# Patient Record
Sex: Female | Born: 1980 | Race: Black or African American | Hispanic: No | Marital: Married | Smoking: Never smoker
Health system: Southern US, Community
[De-identification: ages and names within clinical notes are randomized; demographics above are authoritative.]

## PROBLEM LIST (undated history)

## (undated) ENCOUNTER — Inpatient Hospital Stay (HOSPITAL_COMMUNITY): Payer: Self-pay

## (undated) DIAGNOSIS — T7840XA Allergy, unspecified, initial encounter: Secondary | ICD-10-CM

## (undated) DIAGNOSIS — D573 Sickle-cell trait: Secondary | ICD-10-CM

## (undated) DIAGNOSIS — N6452 Nipple discharge: Secondary | ICD-10-CM

## (undated) HISTORY — DX: Allergy, unspecified, initial encounter: T78.40XA

## (undated) HISTORY — DX: Nipple discharge: N64.52

## (undated) HISTORY — PX: NO PAST SURGERIES: SHX2092

## (undated) HISTORY — DX: Sickle-cell trait: D57.3

---

## 2012-12-12 DIAGNOSIS — N6452 Nipple discharge: Secondary | ICD-10-CM

## 2012-12-12 HISTORY — DX: Nipple discharge: N64.52

## 2013-05-24 ENCOUNTER — Ambulatory Visit: Payer: Self-pay | Admitting: Obstetrics and Gynecology

## 2013-06-18 ENCOUNTER — Encounter: Payer: Self-pay | Admitting: General Surgery

## 2013-06-18 ENCOUNTER — Ambulatory Visit (INDEPENDENT_AMBULATORY_CARE_PROVIDER_SITE_OTHER): Payer: BC Managed Care – PPO | Admitting: General Surgery

## 2013-06-18 VITALS — BP 106/62 | HR 62 | Resp 12 | Ht 64.0 in | Wt 132.0 lb

## 2013-06-18 DIAGNOSIS — N6459 Other signs and symptoms in breast: Secondary | ICD-10-CM

## 2013-06-18 DIAGNOSIS — N6452 Nipple discharge: Secondary | ICD-10-CM | POA: Insufficient documentation

## 2013-06-18 NOTE — Progress Notes (Signed)
Patient ID: Danielle Esparza, female   DOB: [birth year], 32 y.o.   MRN: [id number]  Chief Complaint  Patient presents with  . Follow-up    Ultrasound    HPI Danielle Esparza is a 32 y.o. female  Here for follow up ultrasound of the breast done at [facility]. States that on Memorial day her right breast swelled and lasted about 2 days with nipple discharge that was yellow. No nipple drainage since then. No prior breast issues. The patient denies any nipple oral contact. No previous episodes.  Patient does preform routine self breast exams. Patient has a family history of breast and ovarian cancer.  Minimal pain in right breast since yesterday.  The patient is a native of [place]. 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 HPI  Past Medical History  Diagnosis Date  . Allergy     seasonal    History reviewed. No pertinent past surgical history.  Family History  Problem Relation Age of Onset  . Prostate cancer Father   . Sarcoidosis Father   . Breast cancer Paternal Grandmother   . Breast cancer Maternal Aunt 10    ovarian cancer at age 42    Social History History  Substance Use Topics  . Smoking status: Never Smoker   . Smokeless tobacco: Never Used  . Alcohol Use: Yes    No Known Allergies  Current Outpatient Prescriptions  Medication Sig Dispense Refill  . fluticasone (FLONASE) 50 MCG/ACT nasal spray Place 2 sprays into the nose daily.      Marland Kitchen levonorgestrel-ethinyl estradiol (AVIANE,ALESSE,LESSINA) 0.1-20 MG-MCG tablet Take 1 tablet by mouth daily.       No current facility-administered medications for this visit.    Review of Systems Review of Systems  Constitutional: Negative.   Respiratory: Negative.   Cardiovascular: Negative.     Blood pressure 106/62, pulse 62, resp. rate 12, height 5\' 4"  (1.626 m), weight 132 lb (59.875 kg), last menstrual period 06/15/2013.  Physical Exam Physical Exam  Constitutional: She is oriented to person, place, and time. She appears  well-developed and well-nourished.  Cardiovascular: Normal rate and regular rhythm.   Pulmonary/Chest: Effort normal and breath sounds normal. Right breast exhibits no inverted nipple (right nipple swollen), no mass, no nipple discharge, no skin change and no tenderness. Left breast exhibits no inverted nipple, no mass, no nipple discharge, no skin change and no tenderness.  Lymphadenopathy:    She has no cervical adenopathy.    She has no axillary adenopathy.  Neurological: She is alert and oriented to person, place, and time.  Skin: Skin is warm and dry.  Right breast > left breast, less than 1/2 cup size. Thickening in right breast nipple, no drainage appreciated. Mild prominence of the retroareolar ductal tissue is appreciated when massaging the area between thumb and index finger.   Data Reviewed Ultrasound examination of the right breast dated May 24, 2013 showed a 3 x 3 x 6 mm hypoechoic focus with scant vascular flow in the retroareolar area. BI-RAD-4.    Assessment    Likely inflammatory process resulting in nipple drainage and mild ductal prominence.     Plan    Options for management were viewed: 1) early biopsy versus 2) close clinical and ultrasound followup.   I have a low index of suspicion for malignancy. Without recurrent drainage or inflammation, a 3 month followup is planned.       Earline Mayotte 06/18/2013, 9:10 PM

## 2013-06-18 NOTE — Patient Instructions (Addendum)
Continue self breast exams. Call office for any new breast issues or concerns. Call if symptoms come back before 3 month visit.  Follow up in 3 months with office ultrasound

## 2013-09-18 ENCOUNTER — Ambulatory Visit: Payer: BC Managed Care – PPO | Admitting: General Surgery

## 2013-10-08 ENCOUNTER — Ambulatory Visit (INDEPENDENT_AMBULATORY_CARE_PROVIDER_SITE_OTHER): Payer: BC Managed Care – PPO | Admitting: General Surgery

## 2013-10-08 ENCOUNTER — Encounter: Payer: Self-pay | Admitting: General Surgery

## 2013-10-08 ENCOUNTER — Other Ambulatory Visit: Payer: BC Managed Care – PPO

## 2013-10-08 VITALS — BP 112/60 | HR 76 | Resp 12 | Ht 64.75 in | Wt 133.0 lb

## 2013-10-08 DIAGNOSIS — N63 Unspecified lump in unspecified breast: Secondary | ICD-10-CM

## 2013-10-08 NOTE — Patient Instructions (Addendum)
Patient to return as needed. Patient to contact our office with any new questions or concerns.  

## 2013-10-08 NOTE — Progress Notes (Signed)
Patient ID: Danielle Esparza, female   DOB: 1981-10-20, 32 y.o.   MRN: [id number]  Chief Complaint  Patient presents with  . Follow-up    3 month follow up breast ultrasound for nipple discharge    HPI Danielle Esparza is a 31 y.o. female who presents for a 3 month follow up breast ultrasound for nipple discharge. The patient states nipple discharge has resolved. She denies any new problems with the breasts at this time.  No further breast discomfort reported. The patient continues to work at [employer].  HPI  Past Medical History  Diagnosis Date  . Allergy     seasonal  . Nipple discharge 2014    History reviewed. No pertinent past surgical history.  Family History  Problem Relation Age of Onset  . Prostate cancer Father   . Sarcoidosis Father   . Breast cancer Paternal Grandmother   . Breast cancer Maternal Aunt 84    ovarian cancer at age 63    Social History History  Substance Use Topics  . Smoking status: Never Smoker   . Smokeless tobacco: Never Used  . Alcohol Use: Yes    No Known Allergies  Current Outpatient Prescriptions  Medication Sig Dispense Refill  . fluticasone (FLONASE) 50 MCG/ACT nasal spray Place 2 sprays into the nose daily.      Marland Kitchen levonorgestrel-ethinyl estradiol (AVIANE,ALESSE,LESSINA) 0.1-20 MG-MCG tablet Take 1 tablet by mouth daily.       No current facility-administered medications for this visit.    Review of Systems Review of Systems  Constitutional: Negative.   Respiratory: Negative.   Cardiovascular: Negative.     Blood pressure 112/60, pulse 76, resp. rate 12, height 5' 4.75" (1.645 m), weight 133 lb (60.328 kg), last menstrual period 09/29/2013.  Physical Exam Physical Exam  Constitutional: She is oriented to person, place, and time. She appears well-developed and well-nourished.  Neck: No thyromegaly present.  Cardiovascular: Normal rate, regular rhythm and normal heart sounds.   No murmur heard. Pulmonary/Chest: Effort  normal and breath sounds normal. Right breast exhibits no inverted nipple, no mass, no nipple discharge, no skin change and no tenderness. Left breast exhibits no inverted nipple, no mass, no nipple discharge, no skin change and no tenderness.  Lymphadenopathy:    She has no cervical adenopathy.    She has no axillary adenopathy.  Neurological: She is alert and oriented to person, place, and time.  Skin: Skin is warm and dry.    Data Reviewed  Ultrasound examination of the retroareolar area of the right breast showed the previously appreciated nodule has decreased significantly in size. Grossly this measure 0.22 x 0.23 by 0.33 cm. Previously this had measured 0.33 x 0.64 x 0.65 cm. This lesion shows an 80+ percent reduction in size.    Assessment    Benign breast exam. Possible papilloma, resolving.     Plan    The patient was encouraged to call should she have any recurrent breast discomfort or drainage. Follow up otherwise will be on an as-needed basis.        Earline Mayotte 10/08/2013, 8:34 PM

## 2013-10-14 ENCOUNTER — Ambulatory Visit (INDEPENDENT_AMBULATORY_CARE_PROVIDER_SITE_OTHER): Payer: BC Managed Care – PPO | Admitting: Family Medicine

## 2013-10-14 ENCOUNTER — Encounter: Payer: Self-pay | Admitting: Family Medicine

## 2013-10-14 VITALS — BP 119/71 | HR 74 | Ht 64.75 in | Wt 128.8 lb

## 2013-10-14 DIAGNOSIS — N870 Mild cervical dysplasia: Secondary | ICD-10-CM

## 2013-10-14 DIAGNOSIS — R87810 Cervical high risk human papillomavirus (HPV) DNA test positive: Secondary | ICD-10-CM | POA: Insufficient documentation

## 2013-10-14 DIAGNOSIS — Z23 Encounter for immunization: Secondary | ICD-10-CM

## 2013-10-14 NOTE — Assessment & Plan Note (Signed)
Per new ASCCP guideline--needs co-testing at 12 and 24 months.

## 2013-10-14 NOTE — Patient Instructions (Signed)
Return for next pap in June 2015  Abnormal Pap Test Information During a Pap test, the cells on the surface of your cervix are checked to see if they look normal, abnormal, or if they show signs of having been altered by a certain type of virus called human papillomavirus, or HPV. Cervical cells that have been affected by HPV are called dysplasia. Dysplasia is not cancer, but describes abnormal cells found on the surface of the cervix. Depending on the degree of dysplasia, some of the cells may be considered pre-cancerous and may turn into cancer over time if follow up with a caregiver is delayed.  WHAT DOES AN ABNORMAL PAP TEST MEAN? Having an abnormal pap test does not mean that you have cancer. However, certain types of abnormal pap tests can be a sign that a person is at a higher risk of developing cancer. Your caregiver will want to do other tests to find out more about the abnormal cells. Your abnormal Pap test results could show:   Small and uncertain changes that should be carefully watched.   Cervical dysplasia that has caused mild changes and can be followed over time.  Cervical dysplasia that is more severe and needs to be followed and treated to ensure the problem goes away.  Cancer.  When severe cervical dysplasia is found and treated early, it rarely will grow into cancer.  WHAT WILL BE DONE ABOUT MY ABNORMAL PAP TEST?  A colposcopy may be needed. This is a procedure where your cervix is examined using light and magnification.  A small tissue sample of your cervix (biopsy) may need to be removed and then examined. This is often performed if there are areas that appear infected.  A sample of cells from the cervical canal may be removed with either a small brush or scraping instrument (curette). Based on the results of the procedures above, some caregivers may recommend either cryotherapy of the cervix or a surgical LEEP where a portion of the cervix is removed. LEEP is short  for "loop electrical excisional procedure." Rarely, a caregiver may recommend a cone biopsy.This is a procedure where a small, cone-shaped sample of your cervix is taken out. The part that is taken out is the area where the abnormal cells are.  WHAT IF I HAVE A DYSPLASIA OR A CANCER? You may be referred to a specialist. Radiation may also be a treatment for more advanced cancer. Having a hysterectomy is the last treatment option for dysplasia, but it is a more common treatment for someone with cancer. All treatment options will be discussed with you by your caregiver. WHAT SHOULD YOU DO AFTER BEING TREATED? If you have had an abnormal pap test, you should continue to have regular pap tests and check-ups as directed by your caregiver. Your cervical problem will be carefully watched so it does not get worse. Also, your caregiver can watch for, and treat, any new problems that may come up. Document Released: 03/15/2011 Document Revised: 02/20/2012 Document Reviewed: 11/24/2011 [facility] Patient Information 2014 [place], [place]. Preventive Care for Adults, Female A healthy lifestyle and preventive care can promote health and wellness. Preventive health guidelines for women include the following key practices.  A routine yearly physical is a good way to check with your caregiver about your health and preventive screening. It is a chance to share any concerns and updates on your health, and to receive a thorough exam.  Visit your dentist for a routine exam and preventive care every 6 months.  Brush your teeth twice a day and floss once a day. Good oral hygiene prevents tooth decay and gum disease.  The frequency of eye exams is based on your age, health, family medical history, use of contact lenses, and other factors. Follow your caregiver's recommendations for frequency of eye exams.  Eat a healthy diet. Foods like vegetables, fruits, whole grains, low-fat dairy products, and lean protein foods contain  the nutrients you need without too many calories. Decrease your intake of foods high in solid fats, added sugars, and salt. Eat the right amount of calories for you.Get information about a proper diet from your caregiver, if necessary.  Regular physical exercise is one of the most important things you can do for your health. Most adults should get at least 150 minutes of moderate-intensity exercise (any activity that increases your heart rate and causes you to sweat) each week. In addition, most adults need muscle-strengthening exercises on 2 or more days a week.  Maintain a healthy weight. The body mass index (BMI) is a screening tool to identify possible weight problems. It provides an estimate of body fat based on height and weight. Your caregiver can help determine your BMI, and can help you achieve or maintain a healthy weight.For adults 20 years and older:  A BMI below 18.5 is considered underweight.  A BMI of 18.5 to 24.9 is normal.  A BMI of 25 to 29.9 is considered overweight.  A BMI of 30 and above is considered obese.  Maintain normal blood lipids and cholesterol levels by exercising and minimizing your intake of saturated fat. Eat a balanced diet with plenty of fruit and vegetables. Blood tests for lipids and cholesterol should begin at age 80 and be repeated every 5 years. If your lipid or cholesterol levels are high, you are over 50, or you are at high risk for heart disease, you may need your cholesterol levels checked more frequently.Ongoing high lipid and cholesterol levels should be treated with medicines if diet and exercise are not effective.  If you smoke, find out from your caregiver how to quit. If you do not use tobacco, do not start.  If you are pregnant, do not drink alcohol. If you are breastfeeding, be very cautious about drinking alcohol. If you are not pregnant and choose to drink alcohol, do not exceed 1 drink per day. One drink is considered to be 12 ounces (355  mL) of beer, 5 ounces (148 mL) of wine, or 1.5 ounces (44 mL) of liquor.  Avoid use of street drugs. Do not share needles with anyone. Ask for help if you need support or instructions about stopping the use of drugs.  High blood pressure causes heart disease and increases the risk of stroke. Your blood pressure should be checked at least every 1 to 2 years. Ongoing high blood pressure should be treated with medicines if weight loss and exercise are not effective.  If you are 50 to 30 years old, ask your caregiver if you should take aspirin to prevent strokes.  Diabetes screening involves taking a blood sample to check your fasting blood sugar level. This should be done once every 3 years, after age 98, if you are within normal weight and without risk factors for diabetes. Testing should be considered at a younger age or be carried out more frequently if you are overweight and have at least 1 risk factor for diabetes.  Breast cancer screening is essential preventive care for women. You should practice "breast self-awareness."  This means understanding the normal appearance and feel of your breasts and may include breast self-examination. Any changes detected, no matter how small, should be reported to a caregiver. Women in their 83s and 30s should have a clinical breast exam (CBE) by a caregiver as part of a regular health exam every 1 to 3 years. After age 55, women should have a CBE every year. Starting at age 65, women should consider having a mammography (breast X-ray test) every year. Women who have a family history of breast cancer should talk to their caregiver about genetic screening. Women at a high risk of breast cancer should talk to their caregivers about having magnetic resonance imaging (MRI) and a mammography every year.  The Pap test is a screening test for cervical cancer. A Pap test can show cell changes on the cervix that might become cervical cancer if left untreated. A Pap test is a  procedure in which cells are obtained and examined from the lower end of the uterus (cervix).  Women should have a Pap test starting at age 45.  Between ages 74 and 46, Pap tests should be repeated every 2 years.  Beginning at age 67, you should have a Pap test every 3 years as long as the past 3 Pap tests have been normal.  Some women have medical problems that increase the chance of getting cervical cancer. Talk to your caregiver about these problems. It is especially important to talk to your caregiver if a new problem develops soon after your last Pap test. In these cases, your caregiver may recommend more frequent screening and Pap tests.  The above recommendations are the same for women who have or have not gotten the vaccine for human papillomavirus (HPV).  If you had a hysterectomy for a problem that was not cancer or a condition that could lead to cancer, then you no longer need Pap tests. Even if you no longer need a Pap test, a regular exam is a good idea to make sure no other problems are starting.  If you are between ages 22 and 67, and you have had normal Pap tests going back 10 years, you no longer need Pap tests. Even if you no longer need a Pap test, a regular exam is a good idea to make sure no other problems are starting.  If you have had past treatment for cervical cancer or a condition that could lead to cancer, you need Pap tests and screening for cancer for at least 20 years after your treatment.  If Pap tests have been discontinued, risk factors (such as a new sexual partner) need to be reassessed to determine if screening should be resumed.  The HPV test is an additional test that may be used for cervical cancer screening. The HPV test looks for the virus that can cause the cell changes on the cervix. The cells collected during the Pap test can be tested for HPV. The HPV test could be used to screen women aged 59 years and older, and should be used in women of any age who  have unclear Pap test results. After the age of 42, women should have HPV testing at the same frequency as a Pap test.  Colorectal cancer can be detected and often prevented. Most routine colorectal cancer screening begins at the age of 66 and continues through age 71. However, your caregiver may recommend screening at an earlier age if you have risk factors for colon cancer. On a yearly basis,  your caregiver may provide home test kits to check for hidden blood in the stool. Use of a small camera at the end of a tube, to directly examine the colon (sigmoidoscopy or colonoscopy), can detect the earliest forms of colorectal cancer. Talk to your caregiver about this at age 66, when routine screening begins. Direct examination of the colon should be repeated every 5 to 10 years through age 1, unless early forms of pre-cancerous polyps or small growths are found.  Hepatitis C blood testing is recommended for all people born from [birth year] through 1965 and any individual with known risks for hepatitis C.  Practice safe sex. Use condoms and avoid high-risk sexual practices to reduce the spread of sexually transmitted infections (STIs). STIs include gonorrhea, chlamydia, syphilis, trichomonas, herpes, HPV, and human immunodeficiency virus (HIV). Herpes, HIV, and HPV are viral illnesses that have no cure. They can result in disability, cancer, and death. Sexually active women aged 36 and younger should be checked for chlamydia. Older women with new or multiple partners should also be tested for chlamydia. Testing for other STIs is recommended if you are sexually active and at increased risk.  Osteoporosis is a disease in which the bones lose minerals and strength with aging. This can result in serious bone fractures. The risk of osteoporosis can be identified using a bone density scan. Women ages 42 and over and women at risk for fractures or osteoporosis should discuss screening with their caregivers. Ask your  caregiver whether you should take a calcium supplement or vitamin D to reduce the rate of osteoporosis.  Menopause can be associated with physical symptoms and risks. Hormone replacement therapy is available to decrease symptoms and risks. You should talk to your caregiver about whether hormone replacement therapy is right for you.  Use sunscreen with sun protection factor (SPF) of 30 or more. Apply sunscreen liberally and repeatedly throughout the day. You should seek shade when your shadow is shorter than you. Protect yourself by wearing long sleeves, pants, a wide-brimmed hat, and sunglasses year round, whenever you are outdoors.  Once a month, do a whole body skin exam, using a mirror to look at the skin on your back. Notify your caregiver of new moles, moles that have irregular borders, moles that are larger than a pencil eraser, or moles that have changed in shape or color.  Stay current with required immunizations.  Influenza. You need a dose every fall (or winter). The composition of the flu vaccine changes each year, so being vaccinated once is not enough.  Pneumococcal polysaccharide. You need 1 to 2 doses if you smoke cigarettes or if you have certain chronic medical conditions. You need 1 dose at age 36 (or older) if you have never been vaccinated.  Tetanus, diphtheria, pertussis (Tdap, Td). Get 1 dose of Tdap vaccine if you are younger than age 82, are over 16 and have contact with an infant, are a Research scientist (physical sciences), are pregnant, or simply want to be protected from whooping cough. After that, you need a Td booster dose every 10 years. Consult your caregiver if you have not had at least 3 tetanus and diphtheria-containing shots sometime in your life or have a deep or dirty wound.  HPV. You need this vaccine if you are a woman age 3 or younger. The vaccine is given in 3 doses over 6 months.  Measles, mumps, rubella (MMR). You need at least 1 dose of MMR if you were born in 1957 or  later. You  may also need a second dose.  Meningococcal. If you are age 45 to 3 and a first-year college student living in a residence hall, or have one of several medical conditions, you need to get vaccinated against meningococcal disease. You may also need additional booster doses.  Zoster (shingles). If you are age 51 or older, you should get this vaccine.  Varicella (chickenpox). If you have never had chickenpox or you were vaccinated but received only 1 dose, talk to your caregiver to find out if you need this vaccine.  Hepatitis A. You need this vaccine if you have a specific risk factor for hepatitis A virus infection or you simply wish to be protected from this disease. The vaccine is usually given as 2 doses, 6 to 18 months apart.  Hepatitis B. You need this vaccine if you have a specific risk factor for hepatitis B virus infection or you simply wish to be protected from this disease. The vaccine is given in 3 doses, usually over 6 months. Preventive Services / Frequency Ages 4 to 16  Blood pressure check.** / Every 1 to 2 years.  Lipid and cholesterol check.** / Every 5 years beginning at age 69.  Clinical breast exam.** / Every 3 years for women in their 68s and 30s.  Pap test.** / Every 2 years from ages 4 through 49. Every 3 years starting at age 73 through age 78 or 22 with a history of 3 consecutive normal Pap tests.  HPV screening.** / Every 3 years from ages 53 through ages 86 to 70 with a history of 3 consecutive normal Pap tests.  Hepatitis C blood test.** / For any individual with known risks for hepatitis C.  Skin self-exam. / Monthly.  Influenza immunization.** / Every year.  Pneumococcal polysaccharide immunization.** / 1 to 2 doses if you smoke cigarettes or if you have certain chronic medical conditions.  Tetanus, diphtheria, pertussis (Tdap, Td) immunization. / A one-time dose of Tdap vaccine. After that, you need a Td booster dose every 10 years.  HPV  immunization. / 3 doses over 6 months, if you are 47 and younger.  Measles, mumps, rubella (MMR) immunization. / You need at least 1 dose of MMR if you were born in 1957 or later. You may also need a second dose.  Meningococcal immunization. / 1 dose if you are age 28 to 73 and a first-year college student living in a residence hall, or have one of several medical conditions, you need to get vaccinated against meningococcal disease. You may also need additional booster doses.  Varicella immunization.** / Consult your caregiver.  Hepatitis A immunization.** / Consult your caregiver. 2 doses, 6 to 18 months apart.  Hepatitis B immunization.** / Consult your caregiver. 3 doses usually over 6 months. Ages 5 to 53  Blood pressure check.** / Every 1 to 2 years.  Lipid and cholesterol check.** / Every 5 years beginning at age 51.  Clinical breast exam.** / Every year after age 53.  Mammogram.** / Every year beginning at age 54 and continuing for as long as you are in good health. Consult with your caregiver.  Pap test.** / Every 3 years starting at age 37 through age 77 or 5 with a history of 3 consecutive normal Pap tests.  HPV screening.** / Every 3 years from ages 54 through ages 82 to 23 with a history of 3 consecutive normal Pap tests.  Fecal occult blood test (FOBT) of stool. / Every year beginning at age  50 and continuing until age 55. You may not need to do this test if you get a colonoscopy every 10 years.  Flexible sigmoidoscopy or colonoscopy.** / Every 5 years for a flexible sigmoidoscopy or every 10 years for a colonoscopy beginning at age 88 and continuing until age 26.  Hepatitis C blood test.** / For all people born from [birth year] through 1963 and any individual with known risks for hepatitis C.  Skin self-exam. / Monthly.  Influenza immunization.** / Every year.  Pneumococcal polysaccharide immunization.** / 1 to 2 doses if you smoke cigarettes or if you have certain chronic  medical conditions.  Tetanus, diphtheria, pertussis (Tdap, Td) immunization.** / A one-time dose of Tdap vaccine. After that, you need a Td booster dose every 10 years.  Measles, mumps, rubella (MMR) immunization. / You need at least 1 dose of MMR if you were born in 1957 or later. You may also need a second dose.  Varicella immunization.** / Consult your caregiver.  Meningococcal immunization.** / Consult your caregiver.  Hepatitis A immunization.** / Consult your caregiver. 2 doses, 6 to 18 months apart.  Hepatitis B immunization.** / Consult your caregiver. 3 doses, usually over 6 months. Ages 45 and over  Blood pressure check.** / Every 1 to 2 years.  Lipid and cholesterol check.** / Every 5 years beginning at age 78.  Clinical breast exam.** / Every year after age 51.  Mammogram.** / Every year beginning at age 17 and continuing for as long as you are in good health. Consult with your caregiver.  Pap test.** / Every 3 years starting at age 12 through age 56 or 61 with a 3 consecutive normal Pap tests. Testing can be stopped between 65 and 70 with 3 consecutive normal Pap tests and no abnormal Pap or HPV tests in the past 10 years.  HPV screening.** / Every 3 years from ages 86 through ages 56 or 22 with a history of 3 consecutive normal Pap tests. Testing can be stopped between 65 and 70 with 3 consecutive normal Pap tests and no abnormal Pap or HPV tests in the past 10 years.  Fecal occult blood test (FOBT) of stool. / Every year beginning at age 67 and continuing until age 23. You may not need to do this test if you get a colonoscopy every 10 years.  Flexible sigmoidoscopy or colonoscopy.** / Every 5 years for a flexible sigmoidoscopy or every 10 years for a colonoscopy beginning at age 29 and continuing until age 24.  Hepatitis C blood test.** / For all people born from [birth year] through 1963 and any individual with known risks for hepatitis C.  Osteoporosis screening.** / A  one-time screening for women ages 17 and over and women at risk for fractures or osteoporosis.  Skin self-exam. / Monthly.  Influenza immunization.** / Every year.  Pneumococcal polysaccharide immunization.** / 1 dose at age 66 (or older) if you have never been vaccinated.  Tetanus, diphtheria, pertussis (Tdap, Td) immunization. / A one-time dose of Tdap vaccine if you are over 65 and have contact with an infant, are a Research scientist (physical sciences), or simply want to be protected from whooping cough. After that, you need a Td booster dose every 10 years.  Varicella immunization.** / Consult your caregiver.  Meningococcal immunization.** / Consult your caregiver.  Hepatitis A immunization.** / Consult your caregiver. 2 doses, 6 to 18 months apart.  Hepatitis B immunization.** / Check with your caregiver. 3 doses, usually over 6 months. ** Family history  and personal history of risk and conditions may change your caregiver's recommendations. Document Released: 01/24/2002 Document Revised: 02/20/2012 Document Reviewed: 04/25/2011 [facility] Patient Information 2014 [place], [place].

## 2013-10-14 NOTE — Progress Notes (Signed)
Patient is here transferring her care from [facility] for 6 month follow up for abnormal pap smear.  She did have colposcopy done and was told it was a level one and that she needed 6 month follow up.  Biopsy showed low level of abnormality per the nurse.  She had breast exam last week with Dr. Lemar Livings.  Oncologist).

## 2013-10-14 NOTE — Progress Notes (Signed)
  Subjective:    Patient ID: Danielle Esparza, female    DOB: 1981/07/08, 32 y.o.   MRN: [id number]  HPI  New pt. Today.  Transferred from [facility].  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  Had colpo with LGSIL (per pt) here for f/u. 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  Past Medical History  Diagnosis Date  . Allergy     seasonal  . Nipple discharge 2014   History reviewed. No pertinent past surgical history. No Known Allergies Current Outpatient Prescriptions on File Prior to Visit  Medication Sig Dispense Refill  . fluticasone (FLONASE) 50 MCG/ACT nasal spray Place 2 sprays into the nose daily.      Marland Kitchen levonorgestrel-ethinyl estradiol (AVIANE,ALESSE,LESSINA) 0.1-20 MG-MCG tablet Take 1 tablet by mouth daily.       No current facility-administered medications on file prior to visit.   Family History  Problem Relation Age of Onset  . Prostate cancer Father   . Sarcoidosis Father   . Breast cancer Paternal Grandmother   . Breast cancer Maternal Aunt 58    ovarian cancer at age 46   History   Social History  . Marital Status: Single    Spouse Name: N/A    Number of Children: N/A  . Years of Education: N/A   Occupational History  . Not on file.   Social History Main Topics  . Smoking status: Never Smoker   . Smokeless tobacco: Never Used  . Alcohol Use: Yes  . Drug Use: No  . Sexual Activity: Not on file   Other Topics Concern  . Not on file   Social History Narrative  . No narrative on file     Review of Systems  Constitutional: Negative for fever and chills.  HENT: Negative for sore throat.   Respiratory: Negative for shortness of breath.   Cardiovascular: Negative for chest pain and leg swelling.  Gastrointestinal: Negative for nausea, vomiting, abdominal pain and diarrhea.  Endocrine: Negative for polydipsia and polyphagia.  Genitourinary: Negative for dysuria and hematuria.  Musculoskeletal: Negative  for arthralgias.  Skin: Negative for rash.  Neurological: Negative for headaches.  Psychiatric/Behavioral: Negative for confusion, dysphoric mood and decreased concentration.       Objective:   Physical Exam  Vitals reviewed. Constitutional: She is oriented to person, place, and time. She appears well-developed and well-nourished.  HENT:  Head: Normocephalic and atraumatic.  Eyes: No scleral icterus.  Neck: Neck supple.  Cardiovascular: Normal rate.   Pulmonary/Chest: Effort normal.  Abdominal: Soft. There is no tenderness.  Musculoskeletal: Normal range of motion.  Neurological: She is alert and oriented to person, place, and time.  Skin: Skin is warm. No rash noted.  Psychiatric: She has a normal mood and affect.          Assessment & Plan:  Flu shot See problem list

## 2014-05-14 ENCOUNTER — Emergency Department: Payer: Self-pay | Admitting: Emergency Medicine

## 2014-05-14 LAB — COMPREHENSIVE METABOLIC PANEL
ALT: 32 U/L (ref 12–78)
AST: 28 U/L (ref 15–37)
Albumin: 4.3 g/dL (ref 3.4–5.0)
Alkaline Phosphatase: 57 U/L
Anion Gap: 8 (ref 7–16)
BILIRUBIN TOTAL: 0.6 mg/dL (ref 0.2–1.0)
BUN: 7 mg/dL (ref 7–18)
CO2: 25 mmol/L (ref 21–32)
Calcium, Total: 9.2 mg/dL (ref 8.5–10.1)
Chloride: 105 mmol/L (ref 98–107)
Creatinine: 0.79 mg/dL (ref 0.60–1.30)
EGFR (African American): >60
Glucose: 77 mg/dL (ref 65–99)
Osmolality: 272 (ref 275–301)
Potassium: 3.4 mmol/L — ABNORMAL LOW (ref 3.5–5.1)
Sodium: 138 mmol/L (ref 136–145)
TOTAL PROTEIN: 8.6 g/dL — AB (ref 6.4–8.2)

## 2014-05-14 LAB — CBC
HCT: 38.4 % (ref 35.0–47.0)
HGB: 12.8 g/dL (ref 12.0–16.0)
MCH: 30.4 pg (ref 26.0–34.0)
MCHC: 33.3 g/dL (ref 32.0–36.0)
MCV: 91 fL (ref 80–100)
PLATELETS: 223 10*3/uL (ref 150–440)
RBC: 4.22 10*6/uL (ref 3.80–5.20)
RDW: 13.6 % (ref 11.5–14.5)
WBC: 4.2 10*3/uL (ref 3.6–11.0)

## 2014-05-14 LAB — URINALYSIS, COMPLETE
BILIRUBIN, UR: NEGATIVE
Bacteria: NONE SEEN
GLUCOSE, UR: NEGATIVE mg/dL (ref 0–75)
Leukocyte Esterase: NEGATIVE
NITRITE: NEGATIVE
Ph: 5 (ref 4.5–8.0)
RBC,UR: 63 /HPF (ref 0–5)
SPECIFIC GRAVITY: 1.012 (ref 1.003–1.030)
Squamous Epithelial: 1
WBC UR: 1 /HPF (ref 0–5)

## 2014-05-14 LAB — DRUG SCREEN, URINE

## 2014-05-14 LAB — ETHANOL
Ethanol %: <0.003 % (ref 0.000–0.080)
Ethanol: <3 mg/dL

## 2014-05-14 LAB — SALICYLATE LEVEL: Salicylates, Serum: <1.7 mg/dL

## 2014-05-14 LAB — ACETAMINOPHEN LEVEL: Acetaminophen: <2 ug/mL

## 2014-05-26 ENCOUNTER — Ambulatory Visit (INDEPENDENT_AMBULATORY_CARE_PROVIDER_SITE_OTHER): Payer: BC Managed Care – PPO | Admitting: Obstetrics & Gynecology

## 2014-05-26 ENCOUNTER — Encounter: Payer: Self-pay | Admitting: Obstetrics & Gynecology

## 2014-05-26 VITALS — BP 121/72 | HR 67 | Ht 64.0 in | Wt 126.0 lb

## 2014-05-26 DIAGNOSIS — Z1151 Encounter for screening for human papillomavirus (HPV): Secondary | ICD-10-CM

## 2014-05-26 DIAGNOSIS — Z113 Encounter for screening for infections with a predominantly sexual mode of transmission: Secondary | ICD-10-CM

## 2014-05-26 DIAGNOSIS — Z01419 Encounter for gynecological examination (general) (routine) without abnormal findings: Secondary | ICD-10-CM

## 2014-05-26 DIAGNOSIS — Z309 Encounter for contraceptive management, unspecified: Secondary | ICD-10-CM

## 2014-05-26 DIAGNOSIS — Z124 Encounter for screening for malignant neoplasm of cervix: Secondary | ICD-10-CM

## 2014-05-26 MED ORDER — LEVONORGESTREL-ETHINYL ESTRAD 0.1-20 MG-MCG PO TABS: 1.0000 | ORAL_TABLET | Freq: Every day | ORAL | Status: DC

## 2014-05-26 NOTE — Patient Instructions (Signed)
Thank you for enrolling in [facility]. Please follow the instructions below to securely access your online medical record. MyChart allows you to send messages to your doctor, view your test results, manage appointments, and more.   How Do I Sign Up? 1. In your Internet browser, go to [employer] and enter [email or web address]. 2. Click on the Sign Up Now link in the Sign In box. You will see the New Member Sign Up page. 3. Enter your MyChart Access Code exactly as it appears below. You will not need to use this code after you've completed the sign-up process. If you do not sign up before the expiration date, you must request a new code.  MyChart Access Code: [email or web address] Expires: 07/25/2014  3:23 PM  4. Enter your Social Security Number ([email or web address]) and Date of Birth (mm/dd/yyyy) as indicated and click Submit. You will be taken to the next sign-up page. 5. Create a MyChart ID. This will be your MyChart login ID and cannot be changed, so think of one that is secure and easy to remember. 6. Create a MyChart password. You can change your password at any time. 7. Enter your Password Reset Question and Answer. This can be used at a later time if you forget your password.  8. Enter your e-mail address. You will receive e-mail notification when new information is available in [facility]. 9. Click Sign Up. You can now view your medical record.   Additional Information Remember, MyChart is NOT to be used for urgent needs. For medical emergencies, dial 911.   Preventive Care for Adults, Female A healthy lifestyle and preventive care can promote health and wellness. Preventive health guidelines for women include the following key practices.  A routine yearly physical is a good way to check with your health care provider about your health and preventive screening. It is a chance to share any concerns and updates on your health and to receive a thorough exam.  Visit your dentist for a  routine exam and preventive care every 6 months. Brush your teeth twice a day and floss once a day. Good oral hygiene prevents tooth decay and gum disease.  The frequency of eye exams is based on your age, health, family medical history, use of contact lenses, and other factors. Follow your health care provider's recommendations for frequency of eye exams.  Eat a healthy diet. Foods like vegetables, fruits, whole grains, low-fat dairy products, and lean protein foods contain the nutrients you need without too many calories. Decrease your intake of foods high in solid fats, added sugars, and salt. Eat the right amount of calories for you.Get information about a proper diet from your health care provider, if necessary.  Regular physical exercise is one of the most important things you can do for your health. Most adults should get at least 150 minutes of moderate-intensity exercise (any activity that increases your heart rate and causes you to sweat) each week. In addition, most adults need muscle-strengthening exercises on 2 or more days a week.  Maintain a healthy weight. The body mass index (BMI) is a screening tool to identify possible weight problems. It provides an estimate of body fat based on height and weight. Your health care provider can find your BMI, and can help you achieve or maintain a healthy weight.For adults 20 years and older:  A BMI below 18.5 is considered underweight.  A BMI of 18.5 to 24.9 is normal.  A BMI of 25 to 29.9 is considered overweight.  A  BMI of 30 and above is considered obese.  Maintain normal blood lipids and cholesterol levels by exercising and minimizing your intake of saturated fat. Eat a balanced diet with plenty of fruit and vegetables. Blood tests for lipids and cholesterol should begin at age 86 and be repeated every 5 years. If your lipid or cholesterol levels are high, you are over 50, or you are at high risk for heart disease, you may need your  cholesterol levels checked more frequently.Ongoing high lipid and cholesterol levels should be treated with medicines if diet and exercise are not working.  If you smoke, find out from your health care provider how to quit. If you do not use tobacco, do not start.  Lung cancer screening is recommended for adults aged 64 80 years who are at high risk for developing lung cancer because of a history of smoking. A yearly low-dose CT scan of the lungs is recommended for people who have at least a 30-pack-year history of smoking and are a current smoker or have quit within the past 15 years. A pack year of smoking is smoking an average of 1 pack of cigarettes a day for 1 year (for example: 1 pack a day for 30 years or 2 packs a day for 15 years). Yearly screening should continue until the smoker has stopped smoking for at least 15 years. Yearly screening should be stopped for people who develop a health problem that would prevent them from having lung cancer treatment.  If you are pregnant, do not drink alcohol. If you are breastfeeding, be very cautious about drinking alcohol. If you are not pregnant and choose to drink alcohol, do not have more than 1 drink per day. One drink is considered to be 12 ounces (355 mL) of beer, 5 ounces (148 mL) of wine, or 1.5 ounces (44 mL) of liquor.  Avoid use of street drugs. Do not share needles with anyone. Ask for help if you need support or instructions about stopping the use of drugs.  High blood pressure causes heart disease and increases the risk of stroke. Your blood pressure should be checked at least every 1 to 2 years. Ongoing high blood pressure should be treated with medicines if weight loss and exercise do not work.  If you are 75 31 years old, ask your health care provider if you should take aspirin to prevent strokes.  Diabetes screening involves taking a blood sample to check your fasting blood sugar level. This should be done once every 3 years, after  age 31, if you are within normal weight and without risk factors for diabetes. Testing should be considered at a younger age or be carried out more frequently if you are overweight and have at least 1 risk factor for diabetes.  Breast cancer screening is essential preventive care for women. You should practice "breast self-awareness." This means understanding the normal appearance and feel of your breasts and may include breast self-examination. Any changes detected, no matter how small, should be reported to a health care provider. Women in their 95s and 30s should have a clinical breast exam (CBE) by a health care provider as part of a regular health exam every 1 to 3 years. After age 54, women should have a CBE every year. Starting at age 41, women should consider having a mammogram (breast X-ray test) every year. Women who have a family history of breast cancer should talk to their health care provider about genetic screening. Women at a high  risk of breast cancer should talk to their health care providers about having an MRI and a mammogram every year.  Breast cancer gene (BRCA)-related cancer risk assessment is recommended for women who have family members with BRCA-related cancers. BRCA-related cancers include breast, ovarian, tubal, and peritoneal cancers. Having family members with these cancers may be associated with an increased risk for harmful changes (mutations) in the breast cancer genes BRCA1 and BRCA2. Results of the assessment will determine the need for genetic counseling and BRCA1 and BRCA2 testing.  The Pap test is a screening test for cervical cancer. A Pap test can show cell changes on the cervix that might become cervical cancer if left untreated. A Pap test is a procedure in which cells are obtained and examined from the lower end of the uterus (cervix).  Women should have a Pap test starting at age 85.  Between ages 15 and 58, Pap tests should be repeated every 2  years.  Beginning at age 80, you should have a Pap test every 3 years as long as the past 3 Pap tests have been normal.  Some women have medical problems that increase the chance of getting cervical cancer. Talk to your health care provider about these problems. It is especially important to talk to your health care provider if a new problem develops soon after your last Pap test. In these cases, your health care provider may recommend more frequent screening and Pap tests.  The above recommendations are the same for women who have or have not gotten the vaccine for human papillomavirus (HPV).  If you had a hysterectomy for a problem that was not cancer or a condition that could lead to cancer, then you no longer need Pap tests. Even if you no longer need a Pap test, a regular exam is a good idea to make sure no other problems are starting.  If you are between ages 33 and 16 years, and you have had normal Pap tests going back 10 years, you no longer need Pap tests. Even if you no longer need a Pap test, a regular exam is a good idea to make sure no other problems are starting.  If you have had past treatment for cervical cancer or a condition that could lead to cancer, you need Pap tests and screening for cancer for at least 20 years after your treatment.  If Pap tests have been discontinued, risk factors (such as a new sexual partner) need to be reassessed to determine if screening should be resumed.  The HPV test is an additional test that may be used for cervical cancer screening. The HPV test looks for the virus that can cause the cell changes on the cervix. The cells collected during the Pap test can be tested for HPV. The HPV test could be used to screen women aged 60 years and older, and should be used in women of any age who have unclear Pap test results. After the age of 55, women should have HPV testing at the same frequency as a Pap test.  Colorectal cancer can be detected and often  prevented. Most routine colorectal cancer screening begins at the age of 65 years and continues through age 69 years. However, your health care provider may recommend screening at an earlier age if you have risk factors for colon cancer. On a yearly basis, your health care provider may provide home test kits to check for hidden blood in the stool. Use of a small camera at  the end of a tube, to directly examine the colon (sigmoidoscopy or colonoscopy), can detect the earliest forms of colorectal cancer. Talk to your health care provider about this at age 64, when routine screening begins. Direct exam of the colon should be repeated every 5 10 years through age 35 years, unless early forms of pre-cancerous polyps or small growths are found.  People who are at an increased risk for hepatitis B should be screened for this virus. You are considered at high risk for hepatitis B if:  You were born in a country where hepatitis B occurs often. Talk with your health care provider about which countries are considered high risk.  Your parents were born in a high-risk country and you have not received a shot to protect against hepatitis B (hepatitis B vaccine).  You have HIV or AIDS.  You use needles to inject street drugs.  You live with, or have sex with, someone who has Hepatitis B.  You get hemodialysis treatment.  You take certain medicines for conditions like cancer, organ transplantation, and autoimmune conditions.  Hepatitis C blood testing is recommended for all people born from [birth year] through 1963 and any individual with known risks for hepatitis C.  Practice safe sex. Use condoms and avoid high-risk sexual practices to reduce the spread of sexually transmitted infections (STIs). STIs include gonorrhea, chlamydia, syphilis, trichomonas, herpes, HPV, and human immunodeficiency virus (HIV). Herpes, HIV, and HPV are viral illnesses that have no cure. They can result in disability, cancer, and death.  Sexually active women aged 68 years and younger should be checked for chlamydia. Older women with new or multiple partners should also be tested for chlamydia. Testing for other STIs is recommended if you are sexually active and at increased risk.  Osteoporosis is a disease in which the bones lose minerals and strength with aging. This can result in serious bone fractures or breaks. The risk of osteoporosis can be identified using a bone density scan. Women ages 63 years and over and women at risk for fractures or osteoporosis should discuss screening with their health care providers. Ask your health care provider whether you should take a calcium supplement or vitamin D to reduce the rate of osteoporosis.  Menopause can be associated with physical symptoms and risks. Hormone replacement therapy is available to decrease symptoms and risks. You should talk to your health care provider about whether hormone replacement therapy is right for you.  Use sunscreen. Apply sunscreen liberally and repeatedly throughout the day. You should seek shade when your shadow is shorter than you. Protect yourself by wearing long sleeves, pants, a wide-brimmed hat, and sunglasses year round, whenever you are outdoors.  Once a month, do a whole body skin exam, using a mirror to look at the skin on your back. Tell your health care provider of new moles, moles that have irregular borders, moles that are larger than a pencil eraser, or moles that have changed in shape or color.  Stay current with required vaccines (immunizations).  Influenza vaccine. All adults should be immunized every year.  Tetanus, diphtheria, and acellular pertussis (Td, Tdap) vaccine. Pregnant women should receive 1 dose of Tdap vaccine during each pregnancy. The dose should be obtained regardless of the length of time since the last dose. Immunization is preferred during the 27th 36th week of gestation. An adult who has not previously received Tdap or  who does not know her vaccine status should receive 1 dose of Tdap. This initial dose should  be followed by tetanus and diphtheria toxoids (Td) booster doses every 10 years. Adults with an unknown or incomplete history of completing a 3-dose immunization series with Td-containing vaccines should begin or complete a primary immunization series including a Tdap dose. Adults should receive a Td booster every 10 years.  Varicella vaccine. An adult without evidence of immunity to varicella should receive 2 doses or a second dose if she has previously received 1 dose. Pregnant females who do not have evidence of immunity should receive the first dose after pregnancy. This first dose should be obtained before leaving the health care facility. The second dose should be obtained 4 8 weeks after the first dose.  Human papillomavirus (HPV) vaccine. Females aged 44 26 years who have not received the vaccine previously should obtain the 3-dose series. The vaccine is not recommended for use in pregnant females. However, pregnancy testing is not needed before receiving a dose. If a female is found to be pregnant after receiving a dose, no treatment is needed. In that case, the remaining doses should be delayed until after the pregnancy. Immunization is recommended for any person with an immunocompromised condition through the age of 65 years if she did not get any or all doses earlier. During the 3-dose series, the second dose should be obtained 4 8 weeks after the first dose. The third dose should be obtained 24 weeks after the first dose and 16 weeks after the second dose.  Zoster vaccine. One dose is recommended for adults aged 5 years or older unless certain conditions are present.  Measles, mumps, and rubella (MMR) vaccine. Adults born before [birth year] generally are considered immune to measles and mumps. Adults born in [birth year] or later should have 1 or more doses of MMR vaccine unless there is a contraindication to the  vaccine or there is laboratory evidence of immunity to each of the three diseases. A routine second dose of MMR vaccine should be obtained at least 28 days after the first dose for students attending postsecondary schools, health care workers, or international travelers. People who received inactivated measles vaccine or an unknown type of measles vaccine during 1963 1967 should receive 2 doses of MMR vaccine. People who received inactivated mumps vaccine or an unknown type of mumps vaccine before 1979 and are at high risk for mumps infection should consider immunization with 2 doses of MMR vaccine. For females of childbearing age, rubella immunity should be determined. If there is no evidence of immunity, females who are not pregnant should be vaccinated. If there is no evidence of immunity, females who are pregnant should delay immunization until after pregnancy. Unvaccinated health care workers born before [birth year] who lack laboratory evidence of measles, mumps, or rubella immunity or laboratory confirmation of disease should consider measles and mumps immunization with 2 doses of MMR vaccine or rubella immunization with 1 dose of MMR vaccine.  Pneumococcal 13-valent conjugate (PCV13) vaccine. When indicated, a person who is uncertain of her immunization history and has no record of immunization should receive the PCV13 vaccine. An adult aged 69 years or older who has certain medical conditions and has not been previously immunized should receive 1 dose of PCV13 vaccine. This PCV13 should be followed with a dose of pneumococcal polysaccharide (PPSV23) vaccine. The PPSV23 vaccine dose should be obtained at least 8 weeks after the dose of PCV13 vaccine. An adult aged 88 years or older who has certain medical conditions and previously received 1 or more doses of PPSV23 vaccine should  receive 1 dose of PCV13. The PCV13 vaccine dose should be obtained 1 or more years after the last PPSV23 vaccine dose.  Pneumococcal  polysaccharide (PPSV23) vaccine. When PCV13 is also indicated, PCV13 should be obtained first. All adults aged 68 years and older should be immunized. An adult younger than age 41 years who has certain medical conditions should be immunized. Any person who resides in a nursing home or long-term care facility should be immunized. An adult smoker should be immunized. People with an immunocompromised condition and certain other conditions should receive both PCV13 and PPSV23 vaccines. People with human immunodeficiency virus (HIV) infection should be immunized as soon as possible after diagnosis. Immunization during chemotherapy or radiation therapy should be avoided. Routine use of PPSV23 vaccine is not recommended for American Indians, [place] Natives, or people younger than 65 years unless there are medical conditions that require PPSV23 vaccine. When indicated, people who have unknown immunization and have no record of immunization should receive PPSV23 vaccine. One-time revaccination 5 years after the first dose of PPSV23 is recommended for people aged 8 64 years who have chronic kidney failure, nephrotic syndrome, asplenia, or immunocompromised conditions. People who received 1 2 doses of PPSV23 before age 25 years should receive another dose of PPSV23 vaccine at age 72 years or later if at least 5 years have passed since the previous dose. Doses of PPSV23 are not needed for people immunized with PPSV23 at or after age 18 years.  Meningococcal vaccine. Adults with asplenia or persistent complement component deficiencies should receive 2 doses of quadrivalent meningococcal conjugate (MenACWY-D) vaccine. The doses should be obtained at least 2 months apart. Microbiologists working with certain meningococcal bacteria, [employer] recruits, people at risk during an outbreak, and people who travel to or live in countries with a high rate of meningitis should be immunized. A first-year college student up through age 60  years who is living in a residence hall should receive a dose if she did not receive a dose on or after her 16th birthday. Adults who have certain high-risk conditions should receive one or more doses of vaccine.  Hepatitis A vaccine. Adults who wish to be protected from this disease, have certain high-risk conditions, work with hepatitis A-infected animals, work in hepatitis A research labs, or travel to or work in countries with a high rate of hepatitis A should be immunized. Adults who were previously unvaccinated and who anticipate close contact with an international adoptee during the first 60 days after arrival in the [place] from a country with a high rate of hepatitis A should be immunized.  Hepatitis B vaccine. Adults who wish to be protected from this disease, have certain high-risk conditions, may be exposed to blood or other infectious body fluids, are household contacts or sex partners of hepatitis B positive people, are clients or workers in certain care facilities, or travel to or work in countries with a high rate of hepatitis B should be immunized.  Haemophilus influenzae type b (Hib) vaccine. A previously unvaccinated person with asplenia or sickle cell disease or having a scheduled splenectomy should receive 1 dose of Hib vaccine. Regardless of previous immunization, a recipient of a hematopoietic stem cell transplant should receive a 3-dose series 6 12 months after her successful transplant. Hib vaccine is not recommended for adults with HIV infection. Preventive Services / Frequency Ages 17 to 39years  Blood pressure check.** / Every 1 to 2 years.  Lipid and cholesterol check.** / Every 5 years beginning  at age 50.  Clinical breast exam.** / Every 3 years for women in their 82s and 59s.  BRCA-related cancer risk assessment.** / For women who have family members with a BRCA-related cancer (breast, ovarian, tubal, or peritoneal cancers).  Pap test.** / Every 2 years from  ages 68 through 35. Every 3 years starting at age 92 through age 47 or 14 with a history of 3 consecutive normal Pap tests.  HPV screening.** / Every 3 years from ages 26 through ages 70 to 33 with a history of 3 consecutive normal Pap tests.  Hepatitis C blood test.** / For any individual with known risks for hepatitis C.  Skin self-exam. / Monthly.  Influenza vaccine. / Every year.  Tetanus, diphtheria, and acellular pertussis (Tdap, Td) vaccine.** / Consult your health care provider. Pregnant women should receive 1 dose of Tdap vaccine during each pregnancy. 1 dose of Td every 10 years.  Varicella vaccine.** / Consult your health care provider. Pregnant females who do not have evidence of immunity should receive the first dose after pregnancy.  HPV vaccine. / 3 doses over 6 months, if 57 and younger. The vaccine is not recommended for use in pregnant females. However, pregnancy testing is not needed before receiving a dose.  Measles, mumps, rubella (MMR) vaccine.** / You need at least 1 dose of MMR if you were born in 1957 or later. You may also need a 2nd dose. For females of childbearing age, rubella immunity should be determined. If there is no evidence of immunity, females who are not pregnant should be vaccinated. If there is no evidence of immunity, females who are pregnant should delay immunization until after pregnancy.  Pneumococcal 13-valent conjugate (PCV13) vaccine.** / Consult your health care provider.  Pneumococcal polysaccharide (PPSV23) vaccine.** / 1 to 2 doses if you smoke cigarettes or if you have certain conditions.  Meningococcal vaccine.** / 1 dose if you are age 52 to 39 years and a Market researcher living in a residence hall, or have one of several medical conditions, you need to get vaccinated against meningococcal disease. You may also need additional booster doses.  Hepatitis A vaccine.** / Consult your health care provider.  Hepatitis B vaccine.** /  Consult your health care provider.  Haemophilus influenzae type b (Hib) vaccine.** / Consult your health care provider. Ages 5 to 64years  Blood pressure check.** / Every 1 to 2 years.  Lipid and cholesterol check.** / Every 5 years beginning at age 24 years.  Lung cancer screening. / Every year if you are aged 34 80 years and have a 30-pack-year history of smoking and currently smoke or have quit within the past 15 years. Yearly screening is stopped once you have quit smoking for at least 15 years or develop a health problem that would prevent you from having lung cancer treatment.  Clinical breast exam.** / Every year after age 66 years.  BRCA-related cancer risk assessment.** / For women who have family members with a BRCA-related cancer (breast, ovarian, tubal, or peritoneal cancers).  Mammogram.** / Every year beginning at age 10 years and continuing for as long as you are in good health. Consult with your health care provider.  Pap test.** / Every 3 years starting at age 65 years through age 50 or 38 years with a history of 3 consecutive normal Pap tests.  HPV screening.** / Every 3 years from ages 24 years through ages 12 to 41 years with a history of 3 consecutive normal Pap  tests.  Fecal occult blood test (FOBT) of stool. / Every year beginning at age 66 years and continuing until age 74 years. You may not need to do this test if you get a colonoscopy every 10 years.  Flexible sigmoidoscopy or colonoscopy.** / Every 5 years for a flexible sigmoidoscopy or every 10 years for a colonoscopy beginning at age 30 years and continuing until age 63 years.  Hepatitis C blood test.** / For all people born from [birth year] through 1963 and any individual with known risks for hepatitis C.  Skin self-exam. / Monthly.  Influenza vaccine. / Every year.  Tetanus, diphtheria, and acellular pertussis (Tdap/Td) vaccine.** / Consult your health care provider. Pregnant women should receive 1 dose of  Tdap vaccine during each pregnancy. 1 dose of Td every 10 years.  Varicella vaccine.** / Consult your health care provider. Pregnant females who do not have evidence of immunity should receive the first dose after pregnancy.  Zoster vaccine.** / 1 dose for adults aged 30 years or older.  Measles, mumps, rubella (MMR) vaccine.** / You need at least 1 dose of MMR if you were born in 1957 or later. You may also need a 2nd dose. For females of childbearing age, rubella immunity should be determined. If there is no evidence of immunity, females who are not pregnant should be vaccinated. If there is no evidence of immunity, females who are pregnant should delay immunization until after pregnancy.  Pneumococcal 13-valent conjugate (PCV13) vaccine.** / Consult your health care provider.  Pneumococcal polysaccharide (PPSV23) vaccine.** / 1 to 2 doses if you smoke cigarettes or if you have certain conditions.  Meningococcal vaccine.** / Consult your health care provider.  Hepatitis A vaccine.** / Consult your health care provider.  Hepatitis B vaccine.** / Consult your health care provider.  Haemophilus influenzae type b (Hib) vaccine.** / Consult your health care provider. Ages 62 years and over  Blood pressure check.** / Every 1 to 2 years.  Lipid and cholesterol check.** / Every 5 years beginning at age 58 years.  Lung cancer screening. / Every year if you are aged 46 80 years and have a 30-pack-year history of smoking and currently smoke or have quit within the past 15 years. Yearly screening is stopped once you have quit smoking for at least 15 years or develop a health problem that would prevent you from having lung cancer treatment.  Clinical breast exam.** / Every year after age 21 years.  BRCA-related cancer risk assessment.** / For women who have family members with a BRCA-related cancer (breast, ovarian, tubal, or peritoneal cancers).  Mammogram.** / Every year beginning at age 67  years and continuing for as long as you are in good health. Consult with your health care provider.  Pap test.** / Every 3 years starting at age 109 years through age 55 or 20 years with 3 consecutive normal Pap tests. Testing can be stopped between 65 and 70 years with 3 consecutive normal Pap tests and no abnormal Pap or HPV tests in the past 10 years.  HPV screening.** / Every 3 years from ages 47 years through ages 51 or 85 years with a history of 3 consecutive normal Pap tests. Testing can be stopped between 65 and 70 years with 3 consecutive normal Pap tests and no abnormal Pap or HPV tests in the past 10 years.  Fecal occult blood test (FOBT) of stool. / Every year beginning at age 66 years and continuing until age 40 years. You may  not need to do this test if you get a colonoscopy every 10 years.  Flexible sigmoidoscopy or colonoscopy.** / Every 5 years for a flexible sigmoidoscopy or every 10 years for a colonoscopy beginning at age 75 years and continuing until age 5 years.  Hepatitis C blood test.** / For all people born from [birth year] through 1963 and any individual with known risks for hepatitis C.  Osteoporosis screening.** / A one-time screening for women ages 35 years and over and women at risk for fractures or osteoporosis.  Skin self-exam. / Monthly.  Influenza vaccine. / Every year.  Tetanus, diphtheria, and acellular pertussis (Tdap/Td) vaccine.** / 1 dose of Td every 10 years.  Varicella vaccine.** / Consult your health care provider.  Zoster vaccine.** / 1 dose for adults aged 39 years or older.  Pneumococcal 13-valent conjugate (PCV13) vaccine.** / Consult your health care provider.  Pneumococcal polysaccharide (PPSV23) vaccine.** / 1 dose for all adults aged 79 years and older.  Meningococcal vaccine.** / Consult your health care provider.  Hepatitis A vaccine.** / Consult your health care provider.  Hepatitis B vaccine.** / Consult your health care  provider.  Haemophilus influenzae type b (Hib) vaccine.** / Consult your health care provider. ** Family history and personal history of risk and conditions may change your health care provider's recommendations. Document Released: 01/24/2002 Document Revised: 09/18/2013 Document Reviewed: 04/25/2011 [facility] Patient Information 2014 [place], [place].

## 2014-05-26 NOTE — Progress Notes (Signed)
    GYNECOLOGY CLINIC ANNUAL PREVENTATIVE CARE ENCOUNTER NOTE  Subjective:     Danielle Esparza is a 33 y.o. G0P0000 female here for a routine annual gynecologic exam.  Current complaints: none. Desires health maintenance labs and STI screen.    Gynecologic History Patient's last menstrual period was 05/17/2014. Contraception: OCP (estrogen/progesterone) Last Pap: 05/2013. Results were: abnormal with LGSIL, colposcopy showed CIN I  Obstetric History OB History  Gravida Para Term Preterm AB SAB TAB Ectopic Multiple Living  0 0 0 0 0 0 0 0 0 0       Obstetric Comments  Menstrual age: [age]  Age 1st Pregnancy: N/A   The following portions of the patient's history were reviewed and updated as appropriate: allergies, current medications, past family history, past medical history, past social history, past surgical history and problem list.  Review of Systems Pertinent items are noted in HPI.    Objective:   BP 121/72  Pulse 67  Ht 5\' 4"  (1.626 m)  Wt 126 lb (57.153 kg)  BMI 21.62 kg/m2  LMP 05/17/2014 GENERAL: Well-developed, well-nourished female in no acute distress.  HEENT: Normocephalic, atraumatic. Sclerae anicteric.  NECK: Supple. Normal thyroid.  LUNGS: Clear to auscultation bilaterally.  HEART: Regular rate and rhythm. BREASTS: Symmetric in size. No masses, skin changes, nipple drainage, or lymphadenopathy. ABDOMEN: Soft, nontender, nondistended. No organomegaly. PELVIC: Normal external female genitalia. Vagina is pink and rugated.  Normal discharge. Normal cervix contour. Pap smear obtained. Uterus is normal in size. No adnexal mass or tenderness.  EXTREMITIES: No cyanosis, clubbing, or edema, 2+ distal pulses.   Assessment:   Annual gynecologic examination and contraction management Desires health maintenance labs and STI screen.   Plan:   Pap and ancillary testing for STI  done, will follow up results and manage accordingly.  OCPs refilled Patient will return for  fasting blood test: check lipid panel, CBC, TSH, CMET, HIV, RPR, Hep C antibody Routine preventative health maintenance measures emphasized   Jaynie CollinsUGONNA  Jr Milliron, MD, FACOG Attending Obstetrician & Gynecologist Faculty Practice, [facility]

## 2014-05-29 LAB — CYTOLOGY - PAP

## 2014-06-03 ENCOUNTER — Encounter: Payer: Self-pay | Admitting: Obstetrics & Gynecology

## 2014-10-13 ENCOUNTER — Encounter: Payer: Self-pay | Admitting: Obstetrics & Gynecology

## 2015-05-26 ENCOUNTER — Other Ambulatory Visit: Payer: Self-pay | Admitting: Family Medicine

## 2015-05-26 ENCOUNTER — Encounter: Payer: Self-pay | Admitting: Family Medicine

## 2015-05-26 ENCOUNTER — Ambulatory Visit (INDEPENDENT_AMBULATORY_CARE_PROVIDER_SITE_OTHER): Payer: BC Managed Care – PPO | Admitting: Family Medicine

## 2015-05-26 VITALS — BP 111/74 | HR 68 | Wt 135.6 lb

## 2015-05-26 DIAGNOSIS — Z113 Encounter for screening for infections with a predominantly sexual mode of transmission: Secondary | ICD-10-CM | POA: Diagnosis not present

## 2015-05-26 DIAGNOSIS — Z124 Encounter for screening for malignant neoplasm of cervix: Secondary | ICD-10-CM

## 2015-05-26 DIAGNOSIS — Z3401 Encounter for supervision of normal first pregnancy, first trimester: Secondary | ICD-10-CM | POA: Diagnosis not present

## 2015-05-26 DIAGNOSIS — Z3400 Encounter for supervision of normal first pregnancy, unspecified trimester: Secondary | ICD-10-CM | POA: Insufficient documentation

## 2015-05-26 DIAGNOSIS — Z1151 Encounter for screening for human papillomavirus (HPV): Secondary | ICD-10-CM | POA: Diagnosis not present

## 2015-05-26 DIAGNOSIS — Z3682 Encounter for antenatal screening for nuchal translucency: Secondary | ICD-10-CM

## 2015-05-26 NOTE — Progress Notes (Signed)
   Subjective:    Danielle Esparza is a G1P0000 [redacted]w[redacted]d being seen today for her first obstetrical visit.  Her obstetrical history is not significant.  Pregnancy history fully reviewed.  Patient reports nausea and no vomiting.  Filed Vitals:   05/26/15 1103  BP: 111/74  Pulse: 68  Weight: 135 lb 9.6 oz (61.508 kg)    HISTORY: OB History  Gravida Para Term Preterm AB SAB TAB Ectopic Multiple Living  1 0 0 0 0 0 0 0 0 0     # Outcome Date GA Lbr Len/2nd Weight Sex Delivery Anes PTL Lv  1 Current             Obstetric Comments  Menstrual age: 18    Age 1st Pregnancy: N/A   Past Medical History  Diagnosis Date  . Allergy     seasonal  . Nipple discharge 2014   History reviewed. No pertinent past surgical history. Family History  Problem Relation Age of Onset  . Prostate cancer Father   . Sarcoidosis Father   . Breast cancer Paternal Grandmother   . Breast cancer Maternal Aunt 14    ovarian cancer at age 45     Exam    Uterus:   8 wk size  Pelvic Exam:    Perineum: Normal Perineum   Vulva: Bartholin's, Urethra, Skene's normal   Vagina:  normal mucosa, normal discharge   Cervix: no bleeding following Pap and no cervical motion tenderness   Adnexa: normal adnexa   Bony Pelvis: average  System: Breast:  normal appearance, no masses or tenderness   Skin: normal coloration and turgor, no rashes    Neurologic: oriented, normal   Extremities: normal strength, tone, and muscle mass, ROM of all joints is normal   HEENT sclera clear, anicteric and oropharynx clear, no lesions   Mouth/Teeth mucous membranes moist, pharynx normal without lesions   Neck supple   Cardiovascular: regular rate and rhythm, no murmurs or gallops   Respiratory:  appears well, vitals normal, no respiratory distress, acyanotic, normal RR, ear and throat exam is normal, neck free of mass or lymphadenopathy, chest clear, no wheezing, crepitations, rhonchi, normal symmetric air entry   Abdomen: soft,  non-tender; bowel sounds normal; no masses,  no organomegaly      Assessment:    Pregnancy: G1P0000 Patient Active Problem List   Diagnosis Date Noted  . Supervision of normal first pregnancy 05/26/2015  . Cervical high risk HPV (human papillomavirus) test positive 10/14/2013  . Lump or mass in breast 10/08/2013  . Nipple discharge 06/18/2013        Plan:     Initial labs drawn. Prenatal vitamins. Problem list reviewed and updated. Genetic Screening discussed First Screen: undecided.  Ultrasound discussed; fetal survey: discussed.  Follow up in 4 weeks.    Danielle Esparza S 05/26/2015

## 2015-05-26 NOTE — Patient Instructions (Signed)
First Trimester of Pregnancy The first trimester of pregnancy is from week 1 until the end of week 12 (months 1 through 3). A week after a sperm fertilizes an egg, the egg will implant on the wall of the uterus. This embryo will begin to develop into a baby. Genes from you and your partner are forming the baby. The female genes determine whether the baby is a boy or a girl. At 6-8 weeks, the eyes and face are formed, and the heartbeat can be seen on ultrasound. At the end of 12 weeks, all the baby's organs are formed.  Now that you are pregnant, you will want to do everything you can to have a healthy baby. Two of the most important things are to get good prenatal care and to follow your health care provider's instructions. Prenatal care is all the medical care you receive before the baby's birth. This care will help prevent, find, and treat any problems during the pregnancy and childbirth. BODY CHANGES Your body goes through many changes during pregnancy. The changes vary from woman to woman.   You may gain or lose a couple of pounds at first.  You may feel sick to your stomach (nauseous) and throw up (vomit). If the vomiting is uncontrollable, call your health care provider.  You may tire easily.  You may develop headaches that can be relieved by medicines approved by your health care provider.  You may urinate more often. Painful urination may mean you have a bladder infection.  You may develop heartburn as a result of your pregnancy.  You may develop constipation because certain hormones are causing the muscles that push waste through your intestines to slow down.  You may develop hemorrhoids or swollen, bulging veins (varicose veins).  Your breasts may begin to grow larger and become tender. Your nipples may stick out more, and the tissue that surrounds them (areola) may become darker.  Your gums may bleed and may be sensitive to brushing and flossing.  Dark spots or blotches  (chloasma, mask of pregnancy) may develop on your face. This will likely fade after the baby is born.  Your menstrual periods will stop.  You may have a loss of appetite.  You may develop cravings for certain kinds of food.  You may have changes in your emotions from day to day, such as being excited to be pregnant or being concerned that something may go wrong with the pregnancy and baby.  You may have more vivid and strange dreams.  You may have changes in your hair. These can include thickening of your hair, rapid growth, and changes in texture. Some women also have hair loss during or after pregnancy, or hair that feels dry or thin. Your hair will most likely return to normal after your baby is born. WHAT TO EXPECT AT YOUR PRENATAL VISITS During a routine prenatal visit:  You will be weighed to make sure you and the baby are growing normally.  Your blood pressure will be taken.  Your abdomen will be measured to track your baby's growth.  The fetal heartbeat will be listened to starting around week 10 or 12 of your pregnancy.  Test results from any previous visits will be discussed. Your health care provider may ask you:  How you are feeling.  If you are feeling the baby move.  If you have had any abnormal symptoms, such as leaking fluid, bleeding, severe headaches, or abdominal cramping.  If you have any questions. Other tests   that may be performed during your first trimester include:  Blood tests to find your blood type and to check for the presence of any previous infections. They will also be used to check for low iron levels (anemia) and Rh antibodies. Later in the pregnancy, blood tests for diabetes will be done along with other tests if problems develop.  Urine tests to check for infections, diabetes, or protein in the urine.  An ultrasound to confirm the proper growth and development of the baby.  An amniocentesis to check for possible genetic problems.  Fetal  screens for spina bifida and Down syndrome.  You may need other tests to make sure you and the baby are doing well. HOME CARE INSTRUCTIONS  Medicines  Follow your health care provider's instructions regarding medicine use. Specific medicines may be either safe or unsafe to take during pregnancy.  Take your prenatal vitamins as directed.  If you develop constipation, try taking a stool softener if your health care provider approves. Diet  Eat regular, well-balanced meals. Choose a variety of foods, such as meat or vegetable-based protein, fish, milk and low-fat dairy products, vegetables, fruits, and whole grain breads and cereals. Your health care provider will help you determine the amount of weight gain that is right for you.  Avoid raw meat and uncooked cheese. These carry germs that can cause birth defects in the baby.  Eating four or five small meals rather than three large meals a day may help relieve nausea and vomiting. If you start to feel nauseous, eating a few soda crackers can be helpful. Drinking liquids between meals instead of during meals also seems to help nausea and vomiting.  If you develop constipation, eat more high-fiber foods, such as fresh vegetables or fruit and whole grains. Drink enough fluids to keep your urine clear or pale yellow. Activity and Exercise  Exercise only as directed by your health care provider. Exercising will help you:  Control your weight.  Stay in shape.  Be prepared for labor and delivery.  Experiencing pain or cramping in the lower abdomen or low back is a good sign that you should stop exercising. Check with your health care provider before continuing normal exercises.  Try to avoid standing for long periods of time. Move your legs often if you must stand in one place for a long time.  Avoid heavy lifting.  Wear low-heeled shoes, and practice good posture.  You may continue to have sex unless your health care provider directs you  otherwise. Relief of Pain or Discomfort  Wear a good support bra for breast tenderness.   Take warm sitz baths to soothe any pain or discomfort caused by hemorrhoids. Use hemorrhoid cream if your health care provider approves.   Rest with your legs elevated if you have leg cramps or low back pain.  If you develop varicose veins in your legs, wear support hose. Elevate your feet for 15 minutes, 3-4 times a day. Limit salt in your diet. Prenatal Care  Schedule your prenatal visits by the twelfth week of pregnancy. They are usually scheduled monthly at first, then more often in the last 2 months before delivery.  Write down your questions. Take them to your prenatal visits.  Keep all your prenatal visits as directed by your health care provider. Safety  Wear your seat belt at all times when driving.  Make a list of emergency phone numbers, including numbers for family, friends, the hospital, and police and fire departments. General Tips    Ask your health care provider for a referral to a local prenatal education class. Begin classes no later than at the beginning of month 6 of your pregnancy.  Ask for help if you have counseling or nutritional needs during pregnancy. Your health care provider can offer advice or refer you to specialists for help with various needs.  Do not use hot tubs, steam rooms, or saunas.  Do not douche or use tampons or scented sanitary pads.  Do not cross your legs for long periods of time.  Avoid cat litter boxes and soil used by cats. These carry germs that can cause birth defects in the baby and possibly loss of the fetus by miscarriage or stillbirth.  Avoid all smoking, herbs, alcohol, and medicines not prescribed by your health care provider. Chemicals in these affect the formation and growth of the baby.  Schedule a dentist appointment. At home, brush your teeth with a soft toothbrush and be gentle when you floss. SEEK MEDICAL CARE IF:   You have  dizziness.  You have mild pelvic cramps, pelvic pressure, or nagging pain in the abdominal area.  You have persistent nausea, vomiting, or diarrhea.  You have a bad smelling vaginal discharge.  You have pain with urination.  You notice increased swelling in your face, hands, legs, or ankles. SEEK IMMEDIATE MEDICAL CARE IF:   You have a fever.  You are leaking fluid from your vagina.  You have spotting or bleeding from your vagina.  You have severe abdominal cramping or pain.  You have rapid weight gain or loss.  You vomit blood or material that looks like coffee grounds.  You are exposed to German measles and have never had them.  You are exposed to fifth disease or chickenpox.  You develop a severe headache.  You have shortness of breath.  You have any kind of trauma, such as from a fall or a car accident. Document Released: 11/22/2001 Document Revised: 04/14/2014 Document Reviewed: 10/08/2013 [employer] Patient Information 2015 [employer]. This information is not intended to replace advice given to you by your health care provider. Make sure you discuss any questions you have with your health care provider.  Breastfeeding Deciding to breastfeed is one of the best choices you can make for you and your baby. A change in hormones during pregnancy causes your breast tissue to grow and increases the number and size of your milk ducts. These hormones also allow proteins, sugars, and fats from your blood supply to make breast milk in your milk-producing glands. Hormones prevent breast milk from being released before your baby is born as well as prompt milk flow after birth. Once breastfeeding has begun, thoughts of your baby, as well as his or her sucking or crying, can stimulate the release of milk from your milk-producing glands.  BENEFITS OF BREASTFEEDING For Your Baby  Your first milk (colostrum) helps your baby's digestive system function better.   There are antibodies  in your milk that help your baby fight off infections.   Your baby has a lower incidence of asthma, allergies, and sudden infant death syndrome.   The nutrients in breast milk are better for your baby than infant formulas and are designed uniquely for your baby's needs.   Breast milk improves your baby's brain development.   Your baby is less likely to develop other conditions, such as childhood obesity, asthma, or type 2 diabetes mellitus.  For You   Breastfeeding helps to create a very special bond between   you and your baby.   Breastfeeding is convenient. Breast milk is always available at the correct temperature and costs nothing.   Breastfeeding helps to burn calories and helps you lose the weight gained during pregnancy.   Breastfeeding makes your uterus contract to its prepregnancy size faster and slows bleeding (lochia) after you give birth.   Breastfeeding helps to lower your risk of developing type 2 diabetes mellitus, osteoporosis, and breast or ovarian cancer later in life. SIGNS THAT YOUR BABY IS HUNGRY Early Signs of Hunger  Increased alertness or activity.  Stretching.  Movement of the head from side to side.  Movement of the head and opening of the mouth when the corner of the mouth or cheek is stroked (rooting).  Increased sucking sounds, smacking lips, cooing, sighing, or squeaking.  Hand-to-mouth movements.  Increased sucking of fingers or hands. Late Signs of Hunger  Fussing.  Intermittent crying. Extreme Signs of Hunger Signs of extreme hunger will require calming and consoling before your baby will be able to breastfeed successfully. Do not wait for the following signs of extreme hunger to occur before you initiate breastfeeding:   Restlessness.  A loud, strong cry.   Screaming. BREASTFEEDING BASICS Breastfeeding Initiation  Find a comfortable place to sit or lie down, with your neck and back well supported.  Place a pillow or  rolled up blanket under your baby to bring him or her to the level of your breast (if you are seated). Nursing pillows are specially designed to help support your arms and your baby while you breastfeed.  Make sure that your baby's abdomen is facing your abdomen.   Gently massage your breast. With your fingertips, massage from your chest wall toward your nipple in a circular motion. This encourages milk flow. You may need to continue this action during the feeding if your milk flows slowly.  Support your breast with 4 fingers underneath and your thumb above your nipple. Make sure your fingers are well away from your nipple and your baby's mouth.   Stroke your baby's lips gently with your finger or nipple.   When your baby's mouth is open wide enough, quickly bring your baby to your breast, placing your entire nipple and as much of the colored area around your nipple (areola) as possible into your baby's mouth.   More areola should be visible above your baby's upper lip than below the lower lip.   Your baby's tongue should be between his or her lower gum and your breast.   Ensure that your baby's mouth is correctly positioned around your nipple (latched). Your baby's lips should create a seal on your breast and be turned out (everted).  It is common for your baby to suck about 2-3 minutes in order to start the flow of breast milk. Latching Teaching your baby how to latch on to your breast properly is very important. An improper latch can cause nipple pain and decreased milk supply for you and poor weight gain in your baby. Also, if your baby is not latched onto your nipple properly, he or she may swallow some air during feeding. This can make your baby fussy. Burping your baby when you switch breasts during the feeding can help to get rid of the air. However, teaching your baby to latch on properly is still the best way to prevent fussiness from swallowing air while breastfeeding. Signs  that your baby has successfully latched on to your nipple:    Silent tugging or silent   sucking, without causing you pain.   Swallowing heard between every 3-4 sucks.    Muscle movement above and in front of his or her ears while sucking.  Signs that your baby has not successfully latched on to nipple:   Sucking sounds or smacking sounds from your baby while breastfeeding.  Nipple pain. If you think your baby has not latched on correctly, slip your finger into the corner of your baby's mouth to break the suction and place it between your baby's gums. Attempt breastfeeding initiation again. Signs of Successful Breastfeeding Signs from your baby:   A gradual decrease in the number of sucks or complete cessation of sucking.   Falling asleep.   Relaxation of his or her body.   Retention of a small amount of milk in his or her mouth.   Letting go of your breast by himself or herself. Signs from you:  Breasts that have increased in firmness, weight, and size 1-3 hours after feeding.   Breasts that are softer immediately after breastfeeding.  Increased milk volume, as well as a change in milk consistency and color by the fifth day of breastfeeding.   Nipples that are not sore, cracked, or bleeding. Signs That Your Baby is Getting Enough Milk  Wetting at least 3 diapers in a 24-hour period. The urine should be clear and pale yellow by age 5 days.  At least 3 stools in a 24-hour period by age 5 days. The stool should be soft and yellow.  At least 3 stools in a 24-hour period by age 7 days. The stool should be seedy and yellow.  No loss of weight greater than 10% of birth weight during the first 3 days of age.  Average weight gain of 4-7 ounces (113-198 g) per week after age 4 days.  Consistent daily weight gain by age 5 days, without weight loss after the age of 2 weeks. After a feeding, your baby may spit up a small amount. This is common. BREASTFEEDING FREQUENCY AND  DURATION Frequent feeding will help you make more milk and can prevent sore nipples and breast engorgement. Breastfeed when you feel the need to reduce the fullness of your breasts or when your baby shows signs of hunger. This is called "breastfeeding on demand." Avoid introducing a pacifier to your baby while you are working to establish breastfeeding (the first 4-6 weeks after your baby is born). After this time you may choose to use a pacifier. Research has shown that pacifier use during the first year of a baby's life decreases the risk of sudden infant death syndrome (SIDS). Allow your baby to feed on each breast as long as he or she wants. Breastfeed until your baby is finished feeding. When your baby unlatches or falls asleep while feeding from the first breast, offer the second breast. Because newborns are often sleepy in the first few weeks of life, you may need to awaken your baby to get him or her to feed. Breastfeeding times will vary from baby to baby. However, the following rules can serve as a guide to help you ensure that your baby is properly fed:  Newborns (babies 4 weeks of age or younger) may breastfeed every 1-3 hours.  Newborns should not go longer than 3 hours during the day or 5 hours during the night without breastfeeding.  You should breastfeed your baby a minimum of 8 times in a 24-hour period until you begin to introduce solid foods to your baby at around 6   months of age. BREAST MILK PUMPING Pumping and storing breast milk allows you to ensure that your baby is exclusively fed your breast milk, even at times when you are unable to breastfeed. This is especially important if you are going back to work while you are still breastfeeding or when you are not able to be present during feedings. Your lactation consultant can give you guidelines on how long it is safe to store breast milk.  A breast pump is a machine that allows you to pump milk from your breast into a sterile bottle.  The pumped breast milk can then be stored in a refrigerator or freezer. Some breast pumps are operated by hand, while others use electricity. Ask your lactation consultant which type will work best for you. Breast pumps can be purchased, but some hospitals and breastfeeding support groups lease breast pumps on a monthly basis. A lactation consultant can teach you how to hand express breast milk, if you prefer not to use a pump.  CARING FOR YOUR BREASTS WHILE YOU BREASTFEED Nipples can become dry, cracked, and sore while breastfeeding. The following recommendations can help keep your breasts moisturized and healthy:  Avoid using soap on your nipples.   Wear a supportive bra. Although not required, special nursing bras and tank tops are designed to allow access to your breasts for breastfeeding without taking off your entire bra or top. Avoid wearing underwire-style bras or extremely tight bras.  Air dry your nipples for 3-4minutes after each feeding.   Use only cotton bra pads to absorb leaked breast milk. Leaking of breast milk between feedings is normal.   Use lanolin on your nipples after breastfeeding. Lanolin helps to maintain your skin's normal moisture barrier. If you use pure lanolin, you do not need to wash it off before feeding your baby again. Pure lanolin is not toxic to your baby. You may also hand express a few drops of breast milk and gently massage that milk into your nipples and allow the milk to air dry. In the first few weeks after giving birth, some women experience extremely full breasts (engorgement). Engorgement can make your breasts feel heavy, warm, and tender to the touch. Engorgement peaks within 3-5 days after you give birth. The following recommendations can help ease engorgement:  Completely empty your breasts while breastfeeding or pumping. You may want to start by applying warm, moist heat (in the shower or with warm water-soaked hand towels) just before feeding or  pumping. This increases circulation and helps the milk flow. If your baby does not completely empty your breasts while breastfeeding, pump any extra milk after he or she is finished.  Wear a snug bra (nursing or regular) or tank top for 1-2 days to signal your body to slightly decrease milk production.  Apply ice packs to your breasts, unless this is too uncomfortable for you.  Make sure that your baby is latched on and positioned properly while breastfeeding. If engorgement persists after 48 hours of following these recommendations, contact your health care provider or a lactation consultant. OVERALL HEALTH CARE RECOMMENDATIONS WHILE BREASTFEEDING  Eat healthy foods. Alternate between meals and snacks, eating 3 of each per day. Because what you eat affects your breast milk, some of the foods may make your baby more irritable than usual. Avoid eating these foods if you are sure that they are negatively affecting your baby.  Drink milk, fruit juice, and water to satisfy your thirst (about 10 glasses a day).   Rest   often, relax, and continue to take your prenatal vitamins to prevent fatigue, stress, and anemia.  Continue breast self-awareness checks.  Avoid chewing and smoking tobacco.  Avoid alcohol and drug use. Some medicines that may be harmful to your baby can pass through breast milk. It is important to ask your health care provider before taking any medicine, including all over-the-counter and prescription medicine as well as vitamin and herbal supplements. It is possible to become pregnant while breastfeeding. If birth control is desired, ask your health care provider about options that will be safe for your baby. SEEK MEDICAL CARE IF:   You feel like you want to stop breastfeeding or have become frustrated with breastfeeding.  You have painful breasts or nipples.  Your nipples are cracked or bleeding.  Your breasts are red, tender, or warm.  You have a swollen area on either  breast.  You have a fever or chills.  You have nausea or vomiting.  You have drainage other than breast milk from your nipples.  Your breasts do not become full before feedings by the fifth day after you give birth.  You feel sad and depressed.  Your baby is too sleepy to eat well.  Your baby is having trouble sleeping.   Your baby is wetting less than 3 diapers in a 24-hour period.  Your baby has less than 3 stools in a 24-hour period.  Your baby's skin or the white part of his or her eyes becomes yellow.   Your baby is not gaining weight by 5 days of age. SEEK IMMEDIATE MEDICAL CARE IF:   Your baby is overly tired (lethargic) and does not want to wake up and feed.  Your baby develops an unexplained fever. Document Released: 11/28/2005 Document Revised: 12/03/2013 Document Reviewed: 05/22/2013 [employer] Patient Information 2015 [employer]. This information is not intended to replace advice given to you by your health care provider. Make sure you discuss any questions you have with your health care provider.  

## 2015-05-26 NOTE — Addendum Note (Signed)
Addended by: Barbara Cower on: 05/26/2015 01:54 PM   Modules accepted: Orders

## 2015-05-26 NOTE — Progress Notes (Signed)
Three weeks ago she had increased brown discharge and yesterday there was yellow discharge, no irritation or itching noted.

## 2015-05-27 ENCOUNTER — Encounter: Payer: Self-pay | Admitting: Obstetrics & Gynecology

## 2015-05-27 DIAGNOSIS — Z2839 Other underimmunization status: Secondary | ICD-10-CM | POA: Insufficient documentation

## 2015-05-27 DIAGNOSIS — Z283 Underimmunization status: Secondary | ICD-10-CM | POA: Insufficient documentation

## 2015-05-27 DIAGNOSIS — O9989 Other specified diseases and conditions complicating pregnancy, childbirth and the puerperium: Secondary | ICD-10-CM

## 2015-05-27 LAB — PRENATAL PROFILE (SOLSTAS)
Antibody Screen: NEGATIVE
BASOS ABS: 0 10*3/uL (ref 0.0–0.1)
Basophils Relative: 0 % (ref 0–1)
EOS PCT: 0 % (ref 0–5)
Eosinophils Absolute: 0 10*3/uL (ref 0.0–0.7)
HCT: 34.7 % — ABNORMAL LOW (ref 36.0–46.0)
HEP B S AG: NEGATIVE
HIV 1&2 Ab, 4th Generation: NONREACTIVE
Hemoglobin: 11.6 g/dL — ABNORMAL LOW (ref 12.0–15.0)
LYMPHS PCT: 28 % (ref 12–46)
Lymphs Abs: 1.5 10*3/uL (ref 0.7–4.0)
MCH: 29.4 pg (ref 26.0–34.0)
MCHC: 33.4 g/dL (ref 30.0–36.0)
MCV: 88.1 fL (ref 78.0–100.0)
MONO ABS: 0.6 10*3/uL (ref 0.1–1.0)
MONOS PCT: 12 % (ref 3–12)
MPV: 9.6 fL (ref 8.6–12.4)
Neutro Abs: 3.2 10*3/uL (ref 1.7–7.7)
Neutrophils Relative %: 60 % (ref 43–77)
Platelets: 238 10*3/uL (ref 150–400)
RBC: 3.94 MIL/uL (ref 3.87–5.11)
RDW: 13.6 % (ref 11.5–15.5)
Rh Type: POSITIVE
Rubella: 0.87 Index (ref <0.90)
WBC: 5.4 10*3/uL (ref 4.0–10.5)

## 2015-05-27 LAB — CYTOLOGY - PAP

## 2015-05-28 ENCOUNTER — Encounter: Payer: Self-pay | Admitting: Obstetrics & Gynecology

## 2015-05-28 DIAGNOSIS — D573 Sickle-cell trait: Secondary | ICD-10-CM

## 2015-05-28 HISTORY — DX: Sickle-cell trait: D57.3

## 2015-05-28 LAB — CULTURE, OB URINE
COLONY COUNT: NO GROWTH
Organism ID, Bacteria: NO GROWTH

## 2015-05-28 LAB — HEMOGLOBINOPATHY EVALUATION
HEMOGLOBIN OTHER: 0 %
HGB A2 QUANT: 3.1 % (ref 2.2–3.2)
HGB A: 57.1 % — AB (ref 96.8–97.8)
Hgb F Quant: 0.5 % (ref 0.0–2.0)
Hgb S Quant: 39.3 % — ABNORMAL HIGH

## 2015-06-01 LAB — CYSTIC FIBROSIS DIAGNOSTIC STUDY

## 2015-06-23 ENCOUNTER — Ambulatory Visit (INDEPENDENT_AMBULATORY_CARE_PROVIDER_SITE_OTHER): Payer: BC Managed Care – PPO | Admitting: Family Medicine

## 2015-06-23 ENCOUNTER — Encounter: Payer: Self-pay | Admitting: Family Medicine

## 2015-06-23 VITALS — BP 111/74 | HR 98 | Wt 141.0 lb

## 2015-06-23 DIAGNOSIS — D573 Sickle-cell trait: Secondary | ICD-10-CM

## 2015-06-23 DIAGNOSIS — N63 Unspecified lump in unspecified breast: Secondary | ICD-10-CM

## 2015-06-23 DIAGNOSIS — Z3401 Encounter for supervision of normal first pregnancy, first trimester: Secondary | ICD-10-CM

## 2015-06-23 NOTE — Progress Notes (Signed)
Subjective:  Danielle Esparza is a 34 y.o. G1P0000 at 545w6d being seen today for ongoing prenatal care.  Patient reports no complaints.  Contractions: Not present.  Vag. Bleeding: None. Movement: Absent. Denies leaking of fluid.   The following portions of the patient's history were reviewed and updated as appropriate: allergies, current medications, past family history, past medical history, past social history, past surgical history and problem list.   Objective:   Filed Vitals:   06/23/15 1518  BP: 111/74  Pulse: 98  Weight: 141 lb (63.957 kg)    Fetal Status: Fetal Heart Rate (bpm): + on us   Movement: Absent     General:  Alert, oriented and cooperative. Patient is in no acute distress.  Skin: Skin is warm and dry. No rash noted.   Cardiovascular: Normal heart rate noted  Respiratory: Normal respiratory effort, no problems with respiration noted  Abdomen: Soft, gravid, appropriate for gestational age. Pain/Pressure: Absent     Vaginal: Vag. Bleeding: None.    Vag D/C Character: Thin  Extremities: Normal range of motion.  Edema: None  Mental Status: Normal mood and affect. Normal behavior. Normal judgment and thought content.   Urinalysis: Urine Protein: Negative Urine Glucose: Negative  Assessment and Plan:  Pregnancy: G1P0000 at 255w6d  1. Encounter for supervision of normal first pregnancy in first trimester Continue routine prenatal care. For First screen next week.  2. Hemoglobin A-S genotype/sickle cell trait FOB does not want testing   Please refer to After Visit Summary for other counseling recommendations.   Return in 4 weeks (on 07/21/2015).   Reva Boresanya S Pratt, MD

## 2015-06-23 NOTE — Patient Instructions (Signed)
Breastfeeding Deciding to breastfeed is one of the best choices you can make for you and your baby. A change in hormones during pregnancy causes your breast tissue to grow and increases the number and size of your milk ducts. These hormones also allow proteins, sugars, and fats from your blood supply to make breast milk in your milk-producing glands. Hormones prevent breast milk from being released before your baby is born as well as prompt milk flow after birth. Once breastfeeding has begun, thoughts of your baby, as well as his or her sucking or crying, can stimulate the release of milk from your milk-producing glands.  BENEFITS OF BREASTFEEDING For Your Baby  Your first milk (colostrum) helps your baby's digestive system function better.   There are antibodies in your milk that help your baby fight off infections.   Your baby has a lower incidence of asthma, allergies, and sudden infant death syndrome.   The nutrients in breast milk are better for your baby than infant formulas and are designed uniquely for your baby's needs.   Breast milk improves your baby's brain development.   Your baby is less likely to develop other conditions, such as childhood obesity, asthma, or type 2 diabetes mellitus.  For You   Breastfeeding helps to create a very special bond between you and your baby.   Breastfeeding is convenient. Breast milk is always available at the correct temperature and costs nothing.   Breastfeeding helps to burn calories and helps you lose the weight gained during pregnancy.   Breastfeeding makes your uterus contract to its prepregnancy size faster and slows bleeding (lochia) after you give birth.   Breastfeeding helps to lower your risk of developing type 2 diabetes mellitus, osteoporosis, and breast or ovarian cancer later in life. SIGNS THAT YOUR BABY IS HUNGRY Early Signs of Hunger  Increased alertness or activity.  Stretching.  Movement of the head from  side to side.  Movement of the head and opening of the mouth when the corner of the mouth or cheek is stroked (rooting).  Increased sucking sounds, smacking lips, cooing, sighing, or squeaking.  Hand-to-mouth movements.  Increased sucking of fingers or hands. Late Signs of Hunger  Fussing.  Intermittent crying. Extreme Signs of Hunger Signs of extreme hunger will require calming and consoling before your baby will be able to breastfeed successfully. Do not wait for the following signs of extreme hunger to occur before you initiate breastfeeding:   Restlessness.  A loud, strong cry.   Screaming. BREASTFEEDING BASICS Breastfeeding Initiation  Find a comfortable place to sit or lie down, with your neck and back well supported.  Place a pillow or rolled up blanket under your baby to bring him or her to the level of your breast (if you are seated). Nursing pillows are specially designed to help support your arms and your baby while you breastfeed.  Make sure that your baby's abdomen is facing your abdomen.   Gently massage your breast. With your fingertips, massage from your chest wall toward your nipple in a circular motion. This encourages milk flow. You may need to continue this action during the feeding if your milk flows slowly.  Support your breast with 4 fingers underneath and your thumb above your nipple. Make sure your fingers are well away from your nipple and your baby's mouth.   Stroke your baby's lips gently with your finger or nipple.   When your baby's mouth is open wide enough, quickly bring your baby to your   breast, placing your entire nipple and as much of the colored area around your nipple (areola) as possible into your baby's mouth.   More areola should be visible above your baby's upper lip than below the lower lip.   Your baby's tongue should be between his or her lower gum and your breast.   Ensure that your baby's mouth is correctly positioned  around your nipple (latched). Your baby's lips should create a seal on your breast and be turned out (everted).  It is common for your baby to suck about 2-3 minutes in order to start the flow of breast milk. Latching Teaching your baby how to latch on to your breast properly is very important. An improper latch can cause nipple pain and decreased milk supply for you and poor weight gain in your baby. Also, if your baby is not latched onto your nipple properly, he or she may swallow some air during feeding. This can make your baby fussy. Burping your baby when you switch breasts during the feeding can help to get rid of the air. However, teaching your baby to latch on properly is still the best way to prevent fussiness from swallowing air while breastfeeding. Signs that your baby has successfully latched on to your nipple:    Silent tugging or silent sucking, without causing you pain.   Swallowing heard between every 3-4 sucks.    Muscle movement above and in front of his or her ears while sucking.  Signs that your baby has not successfully latched on to nipple:   Sucking sounds or smacking sounds from your baby while breastfeeding.  Nipple pain. If you think your baby has not latched on correctly, slip your finger into the corner of your baby's mouth to break the suction and place it between your baby's gums. Attempt breastfeeding initiation again. Signs of Successful Breastfeeding Signs from your baby:   A gradual decrease in the number of sucks or complete cessation of sucking.   Falling asleep.   Relaxation of his or her body.   Retention of a small amount of milk in his or her mouth.   Letting go of your breast by himself or herself. Signs from you:  Breasts that have increased in firmness, weight, and size 1-3 hours after feeding.   Breasts that are softer immediately after breastfeeding.  Increased milk volume, as well as a change in milk consistency and color by  the fifth day of breastfeeding.   Nipples that are not sore, cracked, or bleeding. Signs That Your Baby is Getting Enough Milk  Wetting at least 3 diapers in a 24-hour period. The urine should be clear and pale yellow by age 5 days.  At least 3 stools in a 24-hour period by age 5 days. The stool should be soft and yellow.  At least 3 stools in a 24-hour period by age 7 days. The stool should be seedy and yellow.  No loss of weight greater than 10% of birth weight during the first 3 days of age.  Average weight gain of 4-7 ounces (113-198 g) per week after age 4 days.  Consistent daily weight gain by age 5 days, without weight loss after the age of 2 weeks. After a feeding, your baby may spit up a small amount. This is common. BREASTFEEDING FREQUENCY AND DURATION Frequent feeding will help you make more milk and can prevent sore nipples and breast engorgement. Breastfeed when you feel the need to reduce the fullness of your breasts   or when your baby shows signs of hunger. This is called "breastfeeding on demand." Avoid introducing a pacifier to your baby while you are working to establish breastfeeding (the first 4-6 weeks after your baby is born). After this time you may choose to use a pacifier. Research has shown that pacifier use during the first year of a baby's life decreases the risk of sudden infant death syndrome (SIDS). Allow your baby to feed on each breast as long as he or she wants. Breastfeed until your baby is finished feeding. When your baby unlatches or falls asleep while feeding from the first breast, offer the second breast. Because newborns are often sleepy in the first few weeks of life, you may need to awaken your baby to get him or her to feed. Breastfeeding times will vary from baby to baby. However, the following rules can serve as a guide to help you ensure that your baby is properly fed:  Newborns (babies 4 weeks of age or younger) may breastfeed every 1-3  hours.  Newborns should not go longer than 3 hours during the day or 5 hours during the night without breastfeeding.  You should breastfeed your baby a minimum of 8 times in a 24-hour period until you begin to introduce solid foods to your baby at around 6 months of age. BREAST MILK PUMPING Pumping and storing breast milk allows you to ensure that your baby is exclusively fed your breast milk, even at times when you are unable to breastfeed. This is especially important if you are going back to work while you are still breastfeeding or when you are not able to be present during feedings. Your lactation consultant can give you guidelines on how long it is safe to store breast milk.  A breast pump is a machine that allows you to pump milk from your breast into a sterile bottle. The pumped breast milk can then be stored in a refrigerator or freezer. Some breast pumps are operated by hand, while others use electricity. Ask your lactation consultant which type will work best for you. Breast pumps can be purchased, but some hospitals and breastfeeding support groups lease breast pumps on a monthly basis. A lactation consultant can teach you how to hand express breast milk, if you prefer not to use a pump.  CARING FOR YOUR BREASTS WHILE YOU BREASTFEED Nipples can become dry, cracked, and sore while breastfeeding. The following recommendations can help keep your breasts moisturized and healthy:  Avoid using soap on your nipples.   Wear a supportive bra. Although not required, special nursing bras and tank tops are designed to allow access to your breasts for breastfeeding without taking off your entire bra or top. Avoid wearing underwire-style bras or extremely tight bras.  Air dry your nipples for 3-4minutes after each feeding.   Use only cotton bra pads to absorb leaked breast milk. Leaking of breast milk between feedings is normal.   Use lanolin on your nipples after breastfeeding. Lanolin helps to  maintain your skin's normal moisture barrier. If you use pure lanolin, you do not need to wash it off before feeding your baby again. Pure lanolin is not toxic to your baby. You may also hand express a few drops of breast milk and gently massage that milk into your nipples and allow the milk to air dry. In the first few weeks after giving birth, some women experience extremely full breasts (engorgement). Engorgement can make your breasts feel heavy, warm, and tender to the   touch. Engorgement peaks within 3-5 days after you give birth. The following recommendations can help ease engorgement:  Completely empty your breasts while breastfeeding or pumping. You may want to start by applying warm, moist heat (in the shower or with warm water-soaked hand towels) just before feeding or pumping. This increases circulation and helps the milk flow. If your baby does not completely empty your breasts while breastfeeding, pump any extra milk after he or she is finished.  Wear a snug bra (nursing or regular) or tank top for 1-2 days to signal your body to slightly decrease milk production.  Apply ice packs to your breasts, unless this is too uncomfortable for you.  Make sure that your baby is latched on and positioned properly while breastfeeding. If engorgement persists after 48 hours of following these recommendations, contact your health care provider or a lactation consultant. OVERALL HEALTH CARE RECOMMENDATIONS WHILE BREASTFEEDING  Eat healthy foods. Alternate between meals and snacks, eating 3 of each per day. Because what you eat affects your breast milk, some of the foods may make your baby more irritable than usual. Avoid eating these foods if you are sure that they are negatively affecting your baby.  Drink milk, fruit juice, and water to satisfy your thirst (about 10 glasses a day).   Rest often, relax, and continue to take your prenatal vitamins to prevent fatigue, stress, and anemia.  Continue  breast self-awareness checks.  Avoid chewing and smoking tobacco.  Avoid alcohol and drug use. Some medicines that may be harmful to your baby can pass through breast milk. It is important to ask your health care provider before taking any medicine, including all over-the-counter and prescription medicine as well as vitamin and herbal supplements. It is possible to become pregnant while breastfeeding. If birth control is desired, ask your health care provider about options that will be safe for your baby. SEEK MEDICAL CARE IF:   You feel like you want to stop breastfeeding or have become frustrated with breastfeeding.  You have painful breasts or nipples.  Your nipples are cracked or bleeding.  Your breasts are red, tender, or warm.  You have a swollen area on either breast.  You have a fever or chills.  You have nausea or vomiting.  You have drainage other than breast milk from your nipples.  Your breasts do not become full before feedings by the fifth day after you give birth.  You feel sad and depressed.  Your baby is too sleepy to eat well.  Your baby is having trouble sleeping.   Your baby is wetting less than 3 diapers in a 24-hour period.  Your baby has less than 3 stools in a 24-hour period.  Your baby's skin or the white part of his or her eyes becomes yellow.   Your baby is not gaining weight by 5 days of age. SEEK IMMEDIATE MEDICAL CARE IF:   Your baby is overly tired (lethargic) and does not want to wake up and feed.  Your baby develops an unexplained fever. Document Released: 11/28/2005 Document Revised: 12/03/2013 Document Reviewed: 05/22/2013 Exit[employer] Patient Information 2015 [employer]. This information is not intended to replace advice given to you by your health care provider. Make sure you discuss any questions you have with your health care provider.  

## 2015-06-30 ENCOUNTER — Ambulatory Visit (HOSPITAL_COMMUNITY)
Admission: RE | Admit: 2015-06-30 | Discharge: 2015-06-30 | Disposition: A | Discharge status: Home / Self Care | Payer: BC Managed Care – PPO | Source: Ambulatory Visit | Attending: Family Medicine | Admitting: Family Medicine

## 2015-06-30 ENCOUNTER — Encounter (HOSPITAL_COMMUNITY): Payer: Self-pay

## 2015-06-30 VITALS — BP 117/58 | HR 79 | Wt 141.0 lb

## 2015-06-30 DIAGNOSIS — Z3A12 12 weeks gestation of pregnancy: Secondary | ICD-10-CM | POA: Insufficient documentation

## 2015-06-30 DIAGNOSIS — Z3682 Encounter for antenatal screening for nuchal translucency: Secondary | ICD-10-CM | POA: Insufficient documentation

## 2015-06-30 DIAGNOSIS — Z36 Encounter for antenatal screening of mother: Secondary | ICD-10-CM | POA: Insufficient documentation

## 2015-06-30 DIAGNOSIS — Z3401 Encounter for supervision of normal first pregnancy, first trimester: Secondary | ICD-10-CM

## 2015-07-09 ENCOUNTER — Other Ambulatory Visit (HOSPITAL_COMMUNITY): Payer: Self-pay | Admitting: Family Medicine

## 2015-07-20 ENCOUNTER — Ambulatory Visit (INDEPENDENT_AMBULATORY_CARE_PROVIDER_SITE_OTHER): Payer: BC Managed Care – PPO | Admitting: Obstetrics & Gynecology

## 2015-07-20 ENCOUNTER — Encounter: Payer: Self-pay | Admitting: Obstetrics & Gynecology

## 2015-07-20 VITALS — BP 111/70 | HR 79 | Wt 143.0 lb

## 2015-07-20 DIAGNOSIS — O09899 Supervision of other high risk pregnancies, unspecified trimester: Secondary | ICD-10-CM

## 2015-07-20 DIAGNOSIS — Z2839 Other underimmunization status: Secondary | ICD-10-CM

## 2015-07-20 DIAGNOSIS — Z3402 Encounter for supervision of normal first pregnancy, second trimester: Secondary | ICD-10-CM

## 2015-07-20 DIAGNOSIS — D573 Sickle-cell trait: Secondary | ICD-10-CM

## 2015-07-20 DIAGNOSIS — Z283 Underimmunization status: Secondary | ICD-10-CM

## 2015-07-20 DIAGNOSIS — O9989 Other specified diseases and conditions complicating pregnancy, childbirth and the puerperium: Secondary | ICD-10-CM

## 2015-07-20 DIAGNOSIS — R87810 Cervical high risk human papillomavirus (HPV) DNA test positive: Secondary | ICD-10-CM

## 2015-07-20 NOTE — Addendum Note (Signed)
Addended by: Willodean Rosenthal on: 07/20/2015 04:17 PM   Modules accepted: Orders

## 2015-07-20 NOTE — Progress Notes (Signed)
Subjective:  Danielle Esparza is a 34 y.o. G1P0000 at [redacted]w[redacted]d being seen today for ongoing prenatal care.  Patient reports no complaints.  Contractions: Not present.  Vag. Bleeding: None. Movement: Absent. Denies leaking of fluid.   The following portions of the patient's history were reviewed and updated as appropriate: allergies, current medications, past family history, past medical history, past social history, past surgical history and problem list.   Objective:   Filed Vitals:   07/20/15 1550  BP: 111/70  Pulse: 79  Weight: 143 lb (64.864 kg)    Fetal Status: Fetal Heart Rate (bpm): 152   Movement: Absent     General:  Alert, oriented and cooperative. Patient is in no acute distress.  Skin: Skin is warm and dry. No rash noted.   Cardiovascular: Normal heart rate noted  Respiratory: Normal respiratory effort, no problems with respiration noted  Abdomen: Soft, gravid, appropriate for gestational age. Pain/Pressure: Absent     Pelvic: Vag. Bleeding: None Vag D/C Character: Thin   Cervical exam deferred        Extremities: Normal range of motion.  Edema: None  Mental Status: Normal mood and affect. Normal behavior. Normal judgment and thought content.   Urinalysis: Urine Protein: Trace Urine Glucose: Negative  Assessment and Plan:  Pregnancy: G1P0000 at [redacted]w[redacted]d  1. Encounter for supervision of normal first pregnancy in second trimester AFP next visit Anatomy scan in 2 weeks  2. Rubella non-immune status, antepartum   3. Hemoglobin A-S genotype/sickle cell trait   4. 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   Willodean Rosenthal, MD

## 2015-07-20 NOTE — Patient Instructions (Signed)
AFP Maternal This is a routine screen (tests) used to check for fetal abnormalities such as Down syndrome and neural tube defects. Down syndrome is a chromosomal abnormality, sometimes called Trisomy 21. Neural tube defects are serious birth defects. The brain, spinal cord, or their coverings do not develop completely. Women should be tested in the 15th to 20th week of pregnancy. The msAFP screen involves three or four tests that measure substances found in the blood that make the testing better. During development, AFP levels in fetal blood and amniotic fluid rise until about 12 weeks. The levels then gradually fall until birth. AFP is a protein produce by fetal tissue. AFP crosses the placenta and appears in the maternal blood. A baby with an open neural tube defect has an opening in its spine, head, or abdominal wall that allows higher than usual amounts of AFP to pass into the mother's blood. If a screen is positive, more tests are needed to make a diagnosis. These include ultrasound and perhaps amniocentesis (checking the fluid that surrounds the baby). These tests are used to help women and their caregivers make decisions about the management of their pregnancies. In pregnancies where the fetus is carrying the chromosomal defect that results in Down syndrome, the levels of AFP and unconjugated estriol tend to be low and hCG and inhibin A levels high.  PREPARATION FOR TEST Blood is drawn from a vein in your arm usually between the 15th and 20th weeks of pregnancy. Four different tests on your blood are done. These are AFP, hCG, unconjugated estriol, and inhibin A. The combination of tests produces a more accurate result. NORMAL FINDINGS   Adult: less than 40 ng/mL or less than 40 mg/L (SI units).  Child younger than 1 year: less than 30 ng/mL. Ranges are stratified by weeks of gestation and vary among laboratories. Ranges for normal findings may vary among different laboratories and hospitals. You  should always check with your doctor after having lab work or other tests done to discuss the meaning of your test results and whether your values are considered within normal limits. MEANING OF TEST  These are screening tests. Not all fetal abnormalities will give positive test results. Of all women who have positive AFP screening results, only a very small number of them have babies who actually have a neural tube defect or chromosomal abnormality. Your caregiver will go over the test results with you and discuss the importance and meaning of your results, as well as treatment options and the need for additional tests if necessary. OBTAINING THE TEST RESULTS It is your responsibility to obtain your test results. Ask the lab or department performing the test when and how you will get your results. Document Released: 12/20/2004 Document Revised: 04/14/2014 Document Reviewed: 11/01/2008 Exit[employer] Patient Information 2015 [employer]. This information is not intended to replace advice given to you by your health care provider. Make sure you discuss any questions you have with your health care provider.  

## 2015-07-21 ENCOUNTER — Telehealth: Payer: Self-pay | Admitting: *Deleted

## 2015-07-21 NOTE — Telephone Encounter (Signed)
L/M for pt to call office, needed to inform her that the document that was scanned in incorrectly has been removed from My Chart.

## 2015-07-30 NOTE — Progress Notes (Signed)
Order(s) created erroneously. Erroneous order ID: [id number] ° Order moved by: MOORE, SUZANNE H ° Order move date/time: 07/30/2015 12:27 PM ° Source Patient: [id number] ° Source Contact: 05/26/2015 ° Destination Patient: [id number] ° Destination Contact: 02/05/2014 °

## 2015-08-05 ENCOUNTER — Ambulatory Visit (HOSPITAL_COMMUNITY)
Admission: RE | Admit: 2015-08-05 | Discharge: 2015-08-05 | Disposition: A | Discharge status: Home / Self Care | Payer: BC Managed Care – PPO | Source: Ambulatory Visit | Attending: Obstetrics & Gynecology | Admitting: Obstetrics & Gynecology

## 2015-08-05 DIAGNOSIS — D573 Sickle-cell trait: Secondary | ICD-10-CM | POA: Diagnosis not present

## 2015-08-05 DIAGNOSIS — Z3402 Encounter for supervision of normal first pregnancy, second trimester: Secondary | ICD-10-CM | POA: Insufficient documentation

## 2015-08-07 NOTE — Addendum Note (Signed)
Encounter addended by: Willodean Rosenthal, MD on: 08/07/2015 11:49 AM<BR>     Documentation filed: Problem List

## 2015-08-18 ENCOUNTER — Ambulatory Visit (INDEPENDENT_AMBULATORY_CARE_PROVIDER_SITE_OTHER): Payer: BC Managed Care – PPO | Admitting: Obstetrics & Gynecology

## 2015-08-18 VITALS — BP 101/67 | HR 69 | Wt 146.0 lb

## 2015-08-18 DIAGNOSIS — Z36 Encounter for antenatal screening of mother: Secondary | ICD-10-CM

## 2015-08-18 DIAGNOSIS — Z23 Encounter for immunization: Secondary | ICD-10-CM

## 2015-08-18 DIAGNOSIS — Z3402 Encounter for supervision of normal first pregnancy, second trimester: Secondary | ICD-10-CM

## 2015-08-18 NOTE — Addendum Note (Signed)
Addended by: Tandy Gaw C on: 08/18/2015 09:00 AM   Modules accepted: Orders

## 2015-08-18 NOTE — Progress Notes (Signed)
Subjective:  Danielle Esparza is a 34 y.o. G1P0000 at [redacted]w[redacted]d being seen today for ongoing prenatal care.  Patient reports no complaints.  Contractions: Not present.  Vag. Bleeding: None. Movement: Present. Denies leaking of fluid.   The following portions of the patient's history were reviewed and updated as appropriate: allergies, current medications, past family history, past medical history, past social history, past surgical history and problem list.   Objective:   Filed Vitals:   08/18/15 0828  BP: 101/67  Pulse: 69  Weight: 146 lb (66.225 kg)    Fetal Status: Fetal Heart Rate (bpm): 156   Movement: Present     General:  Alert, oriented and cooperative. Patient is in no acute distress.  Skin: Skin is warm and dry. No rash noted.   Cardiovascular: Normal heart rate noted  Respiratory: Normal respiratory effort, no problems with respiration noted  Abdomen: Soft, gravid, appropriate for gestational age. Pain/Pressure: Absent     Pelvic: Vag. Bleeding: None Vag D/C Character: Thin   Cervical exam deferred        Extremities: Normal range of motion.  Edema: None  Mental Status: Normal mood and affect. Normal behavior. Normal judgment and thought content.   Urinalysis: Urine Protein: Negative Urine Glucose: Negative  Assessment and Plan:  Pregnancy: G1P0000 at [redacted]w[redacted]d  1. Encounter for supervision of normal first pregnancy in second trimester - [facility] - Flu Vaccine QUAD 36+ mos IM (Fluarix, Quad PF)  Preterm labor symptoms and general obstetric precautions including but not limited to vaginal bleeding, contractions, leaking of fluid and fetal movement were reviewed in detail with the patient. Zika precautions Please refer to After Visit Summary for other counseling recommendations.  Return in about 4 weeks (around 09/15/2015).   Allie Bossier, MD

## 2015-08-19 LAB — ALPHA FETOPROTEIN, MATERNAL
AFP: 50.7 ng/mL
Curr Gest Age: 20.2 wks.days
MOM FOR AFP: 0.75
Open Spina bifida: NEGATIVE

## 2015-09-15 ENCOUNTER — Ambulatory Visit (INDEPENDENT_AMBULATORY_CARE_PROVIDER_SITE_OTHER): Payer: BC Managed Care – PPO | Admitting: Family Medicine

## 2015-09-15 VITALS — BP 109/67 | HR 83 | Wt 151.0 lb

## 2015-09-15 DIAGNOSIS — Z3402 Encounter for supervision of normal first pregnancy, second trimester: Secondary | ICD-10-CM

## 2015-09-15 NOTE — Patient Instructions (Signed)
Second Trimester of Pregnancy The second trimester is from week 13 through week 28, months 4 through 6. The second trimester is often a time when you feel your best. Your body has also adjusted to being pregnant, and you begin to feel better physically. Usually, morning sickness has lessened or quit completely, you may have more energy, and you may have an increase in appetite. The second trimester is also a time when the fetus is growing rapidly. At the end of the sixth month, the fetus is about 9 inches long and weighs about 1 pounds. You will likely begin to feel the baby move (quickening) between 18 and 20 weeks of the pregnancy. BODY CHANGES Your body goes through many changes during pregnancy. The changes vary from woman to woman.   Your weight will continue to increase. You will notice your lower abdomen bulging out.  You may begin to get stretch marks on your hips, abdomen, and breasts.  You may develop headaches that can be relieved by medicines approved by your health care provider.  You may urinate more often because the fetus is pressing on your bladder.  You may develop or continue to have heartburn as a result of your pregnancy.  You may develop constipation because certain hormones are causing the muscles that push waste through your intestines to slow down.  You may develop hemorrhoids or swollen, bulging veins (varicose veins).  You may have back pain because of the weight gain and pregnancy hormones relaxing your joints between the bones in your pelvis and as a result of a shift in weight and the muscles that support your balance.  Your breasts will continue to grow and be tender.  Your gums may bleed and may be sensitive to brushing and flossing.  Dark spots or blotches (chloasma, mask of pregnancy) may develop on your face. This will likely fade after the baby is born.  A dark line from your belly button to the pubic area (linea nigra) may appear. This will likely  fade after the baby is born.  You may have changes in your hair. These can include thickening of your hair, rapid growth, and changes in texture. Some women also have hair loss during or after pregnancy, or hair that feels dry or thin. Your hair will most likely return to normal after your baby is born. WHAT TO EXPECT AT YOUR PRENATAL VISITS During a routine prenatal visit:  You will be weighed to make sure you and the fetus are growing normally.  Your blood pressure will be taken.  Your abdomen will be measured to track your baby's growth.  The fetal heartbeat will be listened to.  Any test results from the previous visit will be discussed. Your health care provider may ask you:  How you are feeling.  If you are feeling the baby move.  If you have had any abnormal symptoms, such as leaking fluid, bleeding, severe headaches, or abdominal cramping.  If you have any questions. Other tests that may be performed during your second trimester include:  Blood tests that check for:  Low iron levels (anemia).  Gestational diabetes (between 24 and 28 weeks).  Rh antibodies.  Urine tests to check for infections, diabetes, or protein in the urine.  An ultrasound to confirm the proper growth and development of the baby.  An amniocentesis to check for possible genetic problems.  Fetal screens for spina bifida and Down syndrome. HOME CARE INSTRUCTIONS   Avoid all smoking, herbs, alcohol, and unprescribed   drugs. These chemicals affect the formation and growth of the baby.  Follow your health care provider's instructions regarding medicine use. There are medicines that are either safe or unsafe to take during pregnancy.  Exercise only as directed by your health care provider. Experiencing uterine cramps is a good sign to stop exercising.  Continue to eat regular, healthy meals.  Wear a good support bra for breast tenderness.  Do not use hot tubs, steam rooms, or saunas.  Wear  your seat belt at all times when driving.  Avoid raw meat, uncooked cheese, cat litter boxes, and soil used by cats. These carry germs that can cause birth defects in the baby.  Take your prenatal vitamins.  Try taking a stool softener (if your health care provider approves) if you develop constipation. Eat more high-fiber foods, such as fresh vegetables or fruit and whole grains. Drink plenty of fluids to keep your urine clear or pale yellow.  Take warm sitz baths to soothe any pain or discomfort caused by hemorrhoids. Use hemorrhoid cream if your health care provider approves.  If you develop varicose veins, wear support hose. Elevate your feet for 15 minutes, 3-4 times a day. Limit salt in your diet.  Avoid heavy lifting, wear low heel shoes, and practice good posture.  Rest with your legs elevated if you have leg cramps or low back pain.  Visit your dentist if you have not gone yet during your pregnancy. Use a soft toothbrush to brush your teeth and be gentle when you floss.  A sexual relationship may be continued unless your health care provider directs you otherwise.  Continue to go to all your prenatal visits as directed by your health care provider. SEEK MEDICAL CARE IF:   You have dizziness.  You have mild pelvic cramps, pelvic pressure, or nagging pain in the abdominal area.  You have persistent nausea, vomiting, or diarrhea.  You have a bad smelling vaginal discharge.  You have pain with urination. SEEK IMMEDIATE MEDICAL CARE IF:   You have a fever.  You are leaking fluid from your vagina.  You have spotting or bleeding from your vagina.  You have severe abdominal cramping or pain.  You have rapid weight gain or loss.  You have shortness of breath with chest pain.  You notice sudden or extreme swelling of your face, hands, ankles, feet, or legs.  You have not felt your baby move in over an hour.  You have severe headaches that do not go away with  medicine.  You have vision changes. Document Released: 11/22/2001 Document Revised: 12/03/2013 Document Reviewed: 01/29/2013 [employer] Patient Information 2015 [employer]. This information is not intended to replace advice given to you by your health care provider. Make sure you discuss any questions you have with your health care provider.  Breastfeeding Deciding to breastfeed is one of the best choices you can make for you and your baby. A change in hormones during pregnancy causes your breast tissue to grow and increases the number and size of your milk ducts. These hormones also allow proteins, sugars, and fats from your blood supply to make breast milk in your milk-producing glands. Hormones prevent breast milk from being released before your baby is born as well as prompt milk flow after birth. Once breastfeeding has begun, thoughts of your baby, as well as his or her sucking or crying, can stimulate the release of milk from your milk-producing glands.  BENEFITS OF BREASTFEEDING For Your Baby  Your first   milk (colostrum) helps your baby's digestive system function better.   There are antibodies in your milk that help your baby fight off infections.   Your baby has a lower incidence of asthma, allergies, and sudden infant death syndrome.   The nutrients in breast milk are better for your baby than infant formulas and are designed uniquely for your baby's needs.   Breast milk improves your baby's brain development.   Your baby is less likely to develop other conditions, such as childhood obesity, asthma, or type 2 diabetes mellitus.  For You   Breastfeeding helps to create a very special bond between you and your baby.   Breastfeeding is convenient. Breast milk is always available at the correct temperature and costs nothing.   Breastfeeding helps to burn calories and helps you lose the weight gained during pregnancy.   Breastfeeding makes your uterus contract to its  prepregnancy size faster and slows bleeding (lochia) after you give birth.   Breastfeeding helps to lower your risk of developing type 2 diabetes mellitus, osteoporosis, and breast or ovarian cancer later in life. SIGNS THAT YOUR BABY IS HUNGRY Early Signs of Hunger  Increased alertness or activity.  Stretching.  Movement of the head from side to side.  Movement of the head and opening of the mouth when the corner of the mouth or cheek is stroked (rooting).  Increased sucking sounds, smacking lips, cooing, sighing, or squeaking.  Hand-to-mouth movements.  Increased sucking of fingers or hands. Late Signs of Hunger  Fussing.  Intermittent crying. Extreme Signs of Hunger Signs of extreme hunger will require calming and consoling before your baby will be able to breastfeed successfully. Do not wait for the following signs of extreme hunger to occur before you initiate breastfeeding:   Restlessness.  A loud, strong cry.   Screaming. BREASTFEEDING BASICS Breastfeeding Initiation  Find a comfortable place to sit or lie down, with your neck and back well supported.  Place a pillow or rolled up blanket under your baby to bring him or her to the level of your breast (if you are seated). Nursing pillows are specially designed to help support your arms and your baby while you breastfeed.  Make sure that your baby's abdomen is facing your abdomen.   Gently massage your breast. With your fingertips, massage from your chest wall toward your nipple in a circular motion. This encourages milk flow. You may need to continue this action during the feeding if your milk flows slowly.  Support your breast with 4 fingers underneath and your thumb above your nipple. Make sure your fingers are well away from your nipple and your baby's mouth.   Stroke your baby's lips gently with your finger or nipple.   When your baby's mouth is open wide enough, quickly bring your baby to your breast,  placing your entire nipple and as much of the colored area around your nipple (areola) as possible into your baby's mouth.   More areola should be visible above your baby's upper lip than below the lower lip.   Your baby's tongue should be between his or her lower gum and your breast.   Ensure that your baby's mouth is correctly positioned around your nipple (latched). Your baby's lips should create a seal on your breast and be turned out (everted).  It is common for your baby to suck about 2-3 minutes in order to start the flow of breast milk. Latching Teaching your baby how to latch on to your breast   properly is very important. An improper latch can cause nipple pain and decreased milk supply for you and poor weight gain in your baby. Also, if your baby is not latched onto your nipple properly, he or she may swallow some air during feeding. This can make your baby fussy. Burping your baby when you switch breasts during the feeding can help to get rid of the air. However, teaching your baby to latch on properly is still the best way to prevent fussiness from swallowing air while breastfeeding. Signs that your baby has successfully latched on to your nipple:    Silent tugging or silent sucking, without causing you pain.   Swallowing heard between every 3-4 sucks.    Muscle movement above and in front of his or her ears while sucking.  Signs that your baby has not successfully latched on to nipple:   Sucking sounds or smacking sounds from your baby while breastfeeding.  Nipple pain. If you think your baby has not latched on correctly, slip your finger into the corner of your baby's mouth to break the suction and place it between your baby's gums. Attempt breastfeeding initiation again. Signs of Successful Breastfeeding Signs from your baby:   A gradual decrease in the number of sucks or complete cessation of sucking.   Falling asleep.   Relaxation of his or her body.    Retention of a small amount of milk in his or her mouth.   Letting go of your breast by himself or herself. Signs from you:  Breasts that have increased in firmness, weight, and size 1-3 hours after feeding.   Breasts that are softer immediately after breastfeeding.  Increased milk volume, as well as a change in milk consistency and color by the fifth day of breastfeeding.   Nipples that are not sore, cracked, or bleeding. Signs That Your Baby is Getting Enough Milk  Wetting at least 3 diapers in a 24-hour period. The urine should be clear and pale yellow by age 5 days.  At least 3 stools in a 24-hour period by age 5 days. The stool should be soft and yellow.  At least 3 stools in a 24-hour period by age 7 days. The stool should be seedy and yellow.  No loss of weight greater than 10% of birth weight during the first 3 days of age.  Average weight gain of 4-7 ounces (113-198 g) per week after age 4 days.  Consistent daily weight gain by age 5 days, without weight loss after the age of 2 weeks. After a feeding, your baby may spit up a small amount. This is common. BREASTFEEDING FREQUENCY AND DURATION Frequent feeding will help you make more milk and can prevent sore nipples and breast engorgement. Breastfeed when you feel the need to reduce the fullness of your breasts or when your baby shows signs of hunger. This is called "breastfeeding on demand." Avoid introducing a pacifier to your baby while you are working to establish breastfeeding (the first 4-6 weeks after your baby is born). After this time you may choose to use a pacifier. Research has shown that pacifier use during the first year of a baby's life decreases the risk of sudden infant death syndrome (SIDS). Allow your baby to feed on each breast as long as he or she wants. Breastfeed until your baby is finished feeding. When your baby unlatches or falls asleep while feeding from the first breast, offer the second breast.  Because newborns are often sleepy in the   first few weeks of life, you may need to awaken your baby to get him or her to feed. Breastfeeding times will vary from baby to baby. However, the following rules can serve as a guide to help you ensure that your baby is properly fed:  Newborns (babies 4 weeks of age or younger) may breastfeed every 1-3 hours.  Newborns should not go longer than 3 hours during the day or 5 hours during the night without breastfeeding.  You should breastfeed your baby a minimum of 8 times in a 24-hour period until you begin to introduce solid foods to your baby at around 6 months of age. BREAST MILK PUMPING Pumping and storing breast milk allows you to ensure that your baby is exclusively fed your breast milk, even at times when you are unable to breastfeed. This is especially important if you are going back to work while you are still breastfeeding or when you are not able to be present during feedings. Your lactation consultant can give you guidelines on how long it is safe to store breast milk.  A breast pump is a machine that allows you to pump milk from your breast into a sterile bottle. The pumped breast milk can then be stored in a refrigerator or freezer. Some breast pumps are operated by hand, while others use electricity. Ask your lactation consultant which type will work best for you. Breast pumps can be purchased, but some hospitals and breastfeeding support groups lease breast pumps on a monthly basis. A lactation consultant can teach you how to hand express breast milk, if you prefer not to use a pump.  CARING FOR YOUR BREASTS WHILE YOU BREASTFEED Nipples can become dry, cracked, and sore while breastfeeding. The following recommendations can help keep your breasts moisturized and healthy:  Avoid using soap on your nipples.   Wear a supportive bra. Although not required, special nursing bras and tank tops are designed to allow access to your breasts for  breastfeeding without taking off your entire bra or top. Avoid wearing underwire-style bras or extremely tight bras.  Air dry your nipples for 3-4minutes after each feeding.   Use only cotton bra pads to absorb leaked breast milk. Leaking of breast milk between feedings is normal.   Use lanolin on your nipples after breastfeeding. Lanolin helps to maintain your skin's normal moisture barrier. If you use pure lanolin, you do not need to wash it off before feeding your baby again. Pure lanolin is not toxic to your baby. You may also hand express a few drops of breast milk and gently massage that milk into your nipples and allow the milk to air dry. In the first few weeks after giving birth, some women experience extremely full breasts (engorgement). Engorgement can make your breasts feel heavy, warm, and tender to the touch. Engorgement peaks within 3-5 days after you give birth. The following recommendations can help ease engorgement:  Completely empty your breasts while breastfeeding or pumping. You may want to start by applying warm, moist heat (in the shower or with warm water-soaked hand towels) just before feeding or pumping. This increases circulation and helps the milk flow. If your baby does not completely empty your breasts while breastfeeding, pump any extra milk after he or she is finished.  Wear a snug bra (nursing or regular) or tank top for 1-2 days to signal your body to slightly decrease milk production.  Apply ice packs to your breasts, unless this is too uncomfortable for you.    Make sure that your baby is latched on and positioned properly while breastfeeding. If engorgement persists after 48 hours of following these recommendations, contact your health care provider or a lactation consultant. OVERALL HEALTH CARE RECOMMENDATIONS WHILE BREASTFEEDING  Eat healthy foods. Alternate between meals and snacks, eating 3 of each per day. Because what you eat affects your breast milk,  some of the foods may make your baby more irritable than usual. Avoid eating these foods if you are sure that they are negatively affecting your baby.  Drink milk, fruit juice, and water to satisfy your thirst (about 10 glasses a day).   Rest often, relax, and continue to take your prenatal vitamins to prevent fatigue, stress, and anemia.  Continue breast self-awareness checks.  Avoid chewing and smoking tobacco.  Avoid alcohol and drug use. Some medicines that may be harmful to your baby can pass through breast milk. It is important to ask your health care provider before taking any medicine, including all over-the-counter and prescription medicine as well as vitamin and herbal supplements. It is possible to become pregnant while breastfeeding. If birth control is desired, ask your health care provider about options that will be safe for your baby. SEEK MEDICAL CARE IF:   You feel like you want to stop breastfeeding or have become frustrated with breastfeeding.  You have painful breasts or nipples.  Your nipples are cracked or bleeding.  Your breasts are red, tender, or warm.  You have a swollen area on either breast.  You have a fever or chills.  You have nausea or vomiting.  You have drainage other than breast milk from your nipples.  Your breasts do not become full before feedings by the fifth day after you give birth.  You feel sad and depressed.  Your baby is too sleepy to eat well.  Your baby is having trouble sleeping.   Your baby is wetting less than 3 diapers in a 24-hour period.  Your baby has less than 3 stools in a 24-hour period.  Your baby's skin or the white part of his or her eyes becomes yellow.   Your baby is not gaining weight by 5 days of age. SEEK IMMEDIATE MEDICAL CARE IF:   Your baby is overly tired (lethargic) and does not want to wake up and feed.  Your baby develops an unexplained fever. Document Released: 11/28/2005 Document Revised:  12/03/2013 Document Reviewed: 05/22/2013 [employer] Patient Information 2015 [employer]. This information is not intended to replace advice given to you by your health care provider. Make sure you discuss any questions you have with your health care provider.  

## 2015-09-15 NOTE — Progress Notes (Signed)
Subjective:  Danielle Esparza is a 34 y.o. G1P0000 at [redacted]w[redacted]d being seen today for ongoing prenatal care.  Patient reports no complaints.  Contractions: Not present.  Vag. Bleeding: None. Movement: Present. Denies leaking of fluid.   The following portions of the patient's history were reviewed and updated as appropriate: allergies, current medications, past family history, past medical history, past social history, past surgical history and problem list.   Objective:   Filed Vitals:   09/15/15 0902  BP: 109/67  Pulse: 83  Weight: 151 lb (68.493 kg)    Fetal Status: Fetal Heart Rate (bpm): 153 Fundal Height: 23 cm Movement: Present     General:  Alert, oriented and cooperative. Patient is in no acute distress.  Skin: Skin is warm and dry. No rash noted.   Cardiovascular: Normal heart rate noted  Respiratory: Normal respiratory effort, no problems with respiration noted  Abdomen: Soft, gravid, appropriate for gestational age. Pain/Pressure: Present     Pelvic: Vag. Bleeding: None Vag D/C Character: Thin   Cervical exam deferred        Extremities: Normal range of motion.  Edema: None  Mental Status: Normal mood and affect. Normal behavior. Normal judgment and thought content.   Urinalysis: Urine Protein: Negative Urine Glucose: Trace  Assessment and Plan:  Pregnancy: G1P0000 at [redacted]w[redacted]d  1. Encounter for supervision of normal first pregnancy in second trimester Continue routine prenatal care.  Preterm labor symptoms and general obstetric precautions including but not limited to vaginal bleeding, contractions, leaking of fluid and fetal movement were reviewed in detail with the patient. Please refer to After Visit Summary for other counseling recommendations.  Return in 4 weeks (on 10/13/2015).   Reva Bores, MD

## 2015-09-21 ENCOUNTER — Ambulatory Visit (INDEPENDENT_AMBULATORY_CARE_PROVIDER_SITE_OTHER): Payer: BC Managed Care – PPO | Admitting: Family Medicine

## 2015-09-21 ENCOUNTER — Encounter: Payer: Self-pay | Admitting: Family Medicine

## 2015-09-21 VITALS — BP 100/66 | HR 88 | Temp 98.3°F | Resp 16 | Wt 152.2 lb

## 2015-09-21 DIAGNOSIS — N3090 Cystitis, unspecified without hematuria: Secondary | ICD-10-CM | POA: Diagnosis not present

## 2015-09-21 DIAGNOSIS — O2312 Infections of bladder in pregnancy, second trimester: Secondary | ICD-10-CM

## 2015-09-21 LAB — POCT URINALYSIS DIPSTICK
Bilirubin, UA: NEGATIVE
Blood, UA: NEGATIVE
KETONES UA: NEGATIVE
NITRITE UA: NEGATIVE
PH UA: 6
PROTEIN UA: NEGATIVE
Spec Grav, UA: 1.015
UROBILINOGEN UA: 0.2

## 2015-09-21 MED ORDER — NITROFURANTOIN MONOHYD MACRO 100 MG PO CAPS: 100.0000 mg | ORAL_CAPSULE | Freq: Two times a day (BID) | ORAL | Status: DC

## 2015-09-21 NOTE — Progress Notes (Signed)
Subjective:     Patient ID: Danielle Esparza, female   DOB: 30-Mar-1981, 34 y.o.   MRN: [id number]  HPI  Chief Complaint  Patient presents with  . Abdominal Pain    Patient comes in office today with concerns of lower abdominal pain that she describes as sharp. Patient states that pain began 24hrs ago and has remained constant. Patient also reports that she has had pain with urination, she denies back pain but states that urination is slightly more increased, she states that she has pelvic pressure.   Last treated for an E.Coli infection in January.   Review of Systems  Constitutional: Negative for fever and chills.       Objective:   Physical Exam  Constitutional: She appears well-developed and well-nourished. No distress.  Genitourinary:  No c.v. tenderness       Assessment:    1. Cystitis of pregnancy, second trimeste - POCT urinalysis dipstick - Urine culture - nitrofurantoin, macrocrystal-monohydrate, (MACROBID) 100 MG capsule; Take 1 capsule (100 mg total) by mouth 2 (two) times daily.  Dispense: 14 capsule; Refill: 0    Plan:    Further f/u pending culture results.

## 2015-09-21 NOTE — Patient Instructions (Signed)
We will call you with the urine culture report. 

## 2015-09-23 ENCOUNTER — Encounter: Payer: BC Managed Care – PPO | Admitting: Certified Nurse Midwife

## 2015-10-13 ENCOUNTER — Ambulatory Visit (INDEPENDENT_AMBULATORY_CARE_PROVIDER_SITE_OTHER): Payer: BC Managed Care – PPO | Admitting: Family Medicine

## 2015-10-13 VITALS — BP 105/73 | HR 98 | Wt 155.0 lb

## 2015-10-13 DIAGNOSIS — Z3402 Encounter for supervision of normal first pregnancy, second trimester: Secondary | ICD-10-CM

## 2015-10-13 DIAGNOSIS — Z23 Encounter for immunization: Secondary | ICD-10-CM | POA: Diagnosis not present

## 2015-10-13 DIAGNOSIS — Z3492 Encounter for supervision of normal pregnancy, unspecified, second trimester: Secondary | ICD-10-CM

## 2015-10-13 DIAGNOSIS — Z3403 Encounter for supervision of normal first pregnancy, third trimester: Secondary | ICD-10-CM

## 2015-10-13 LAB — CBC
HEMATOCRIT: 30.6 % — AB (ref 36.0–46.0)
Hemoglobin: 10.6 g/dL — ABNORMAL LOW (ref 12.0–15.0)
MCH: 30 pg (ref 26.0–34.0)
MCHC: 34.6 g/dL (ref 30.0–36.0)
MCV: 86.7 fL (ref 78.0–100.0)
MPV: 9.6 fL (ref 8.6–12.4)
PLATELETS: 230 10*3/uL (ref 150–400)
RBC: 3.53 MIL/uL — AB (ref 3.87–5.11)
RDW: 13.3 % (ref 11.5–15.5)
WBC: 6.1 10*3/uL (ref 4.0–10.5)

## 2015-10-13 MED ORDER — TETANUS-DIPHTH-ACELL PERTUSSIS 5-2.5-18.5 LF-MCG/0.5 IM SUSP: 0.5000 mL | Freq: Once | INTRAMUSCULAR | Status: DC

## 2015-10-13 NOTE — Progress Notes (Signed)
Subjective:  Danielle Esparza is a 34 y.o. G1P0000 at 6240w6d being seen today for ongoing prenatal care.  Patient reports no complaints.  Contractions: Not present.  Vag. Bleeding: None. Movement: Present. Denies leaking of fluid.   The following portions of the patient's history were reviewed and updated as appropriate: allergies, current medications, past family history, past medical history, past social history, past surgical history and problem list. Problem list updated.  Objective:   Filed Vitals:   10/13/15 0841  BP: 105/73  Pulse: 98  Weight: 155 lb (70.308 kg)    Fetal Status: Fetal Heart Rate (bpm): 154   Movement: Present     General:  Alert, oriented and cooperative. Patient is in no acute distress.  Skin: Skin is warm and dry. No rash noted.   Cardiovascular: Normal heart rate noted  Respiratory: Normal respiratory effort, no problems with respiration noted  Abdomen: Soft, gravid, appropriate for gestational age. FH 28. Pain/Pressure: Present     Pelvic: Vag. Bleeding: None Vag D/C Character: Thin   Cervical exam deferred        Extremities: Normal range of motion.  Edema: None  Mental Status: Normal mood and affect. Normal behavior. Normal judgment and thought content.   Urinalysis: Urine Protein: Negative Urine Glucose: Negative  Assessment and Plan:  Pregnancy: G1P0000 at 5740w6d  1. Encounter for supervision of normal first pregnancy in third trimester Continue routine prenatal care.  - CBC - RPR - HIV antibody - Glucose Tolerance, 1 HR (50g) - Tdap (BOOSTRIX) injection 0.5 mL; Inject 0.5 mLs into the muscle once.  Preterm labor symptoms and general obstetric precautions including but not limited to vaginal bleeding, contractions, leaking of fluid and fetal movement were reviewed in detail with the patient. Please refer to After Visit Summary for other counseling recommendations.  Return in 2 weeks (on 10/27/2015).   Reva Boresanya S Haru Anspaugh, MD

## 2015-10-13 NOTE — Patient Instructions (Addendum)
Breastfeeding Deciding to breastfeed is one of the best choices you can make for you and your baby. A change in hormones during pregnancy causes your breast tissue to grow and increases the number and size of your milk ducts. These hormones also allow proteins, sugars, and fats from your blood supply to make breast milk in your milk-producing glands. Hormones prevent breast milk from being released before your baby is born as well as prompt milk flow after birth. Once breastfeeding has begun, thoughts of your baby, as well as his or her sucking or crying, can stimulate the release of milk from your milk-producing glands.  BENEFITS OF BREASTFEEDING For Your Baby  Your first milk (colostrum) helps your baby's digestive system function better.  There are antibodies in your milk that help your baby fight off infections.  Your baby has a lower incidence of asthma, allergies, and sudden infant death syndrome.  The nutrients in breast milk are better for your baby than infant formulas and are designed uniquely for your baby's needs.  Breast milk improves your baby's brain development.  Your baby is less likely to develop other conditions, such as childhood obesity, asthma, or type 2 diabetes mellitus. For You  Breastfeeding helps to create a very special bond between you and your baby.  Breastfeeding is convenient. Breast milk is always available at the correct temperature and costs nothing.  Breastfeeding helps to burn calories and helps you lose the weight gained during pregnancy.  Breastfeeding makes your uterus contract to its prepregnancy size faster and slows bleeding (lochia) after you give birth.   Breastfeeding helps to lower your risk of developing type 2 diabetes mellitus, osteoporosis, and breast or ovarian cancer later in life. SIGNS THAT YOUR BABY IS HUNGRY Early Signs of Hunger  Increased alertness or activity.  Stretching.  Movement of the head from side to  side.  Movement of the head and opening of the mouth when the corner of the mouth or cheek is stroked (rooting).  Increased sucking sounds, smacking lips, cooing, sighing, or squeaking.  Hand-to-mouth movements.  Increased sucking of fingers or hands. Late Signs of Hunger  Fussing.  Intermittent crying. Extreme Signs of Hunger Signs of extreme hunger will require calming and consoling before your baby will be able to breastfeed successfully. Do not wait for the following signs of extreme hunger to occur before you initiate breastfeeding:  Restlessness.  A loud, strong cry.  Screaming. BREASTFEEDING BASICS Breastfeeding Initiation  Find a comfortable place to sit or lie down, with your neck and back well supported.  Place a pillow or rolled up blanket under your baby to bring him or her to the level of your breast (if you are seated). Nursing pillows are specially designed to help support your arms and your baby while you breastfeed.  Make sure that your baby's abdomen is facing your abdomen.  Gently massage your breast. With your fingertips, massage from your chest wall toward your nipple in a circular motion. This encourages milk flow. You may need to continue this action during the feeding if your milk flows slowly.  Support your breast with 4 fingers underneath and your thumb above your nipple. Make sure your fingers are well away from your nipple and your baby's mouth.  Stroke your baby's lips gently with your finger or nipple.  When your baby's mouth is open wide enough, quickly bring your baby to your breast, placing your entire nipple and as much of the colored area around your nipple (  areola) as possible into your baby's mouth.  More areola should be visible above your baby's upper lip than below the lower lip.  Your baby's tongue should be between his or her lower gum and your breast.  Ensure that your baby's mouth is correctly positioned around your nipple  (latched). Your baby's lips should create a seal on your breast and be turned out (everted).  It is common for your baby to suck about 2-3 minutes in order to start the flow of breast milk. Latching Teaching your baby how to latch on to your breast properly is very important. An improper latch can cause nipple pain and decreased milk supply for you and poor weight gain in your baby. Also, if your baby is not latched onto your nipple properly, he or she may swallow some air during feeding. This can make your baby fussy. Burping your baby when you switch breasts during the feeding can help to get rid of the air. However, teaching your baby to latch on properly is still the best way to prevent fussiness from swallowing air while breastfeeding. Signs that your baby has successfully latched on to your nipple:  Silent tugging or silent sucking, without causing you pain.  Swallowing heard between every 3-4 sucks.  Muscle movement above and in front of his or her ears while sucking. Signs that your baby has not successfully latched on to nipple:  Sucking sounds or smacking sounds from your baby while breastfeeding.  Nipple pain. If you think your baby has not latched on correctly, slip your finger into the corner of your baby's mouth to break the suction and place it between your baby's gums. Attempt breastfeeding initiation again. Signs of Successful Breastfeeding Signs from your baby:  A gradual decrease in the number of sucks or complete cessation of sucking.  Falling asleep.  Relaxation of his or her body.  Retention of a small amount of milk in his or her mouth.  Letting go of your breast by himself or herself. Signs from you:  Breasts that have increased in firmness, weight, and size 1-3 hours after feeding.  Breasts that are softer immediately after breastfeeding.  Increased milk volume, as well as a change in milk consistency and color by the fifth day of breastfeeding.  Nipples  that are not sore, cracked, or bleeding. Signs That Your Baby is Getting Enough Milk  Wetting at least 3 diapers in a 24-hour period. The urine should be clear and pale yellow by age 5 days.  At least 3 stools in a 24-hour period by age 5 days. The stool should be soft and yellow.  At least 3 stools in a 24-hour period by age 7 days. The stool should be seedy and yellow.  No loss of weight greater than 10% of birth weight during the first 3 days of age.  Average weight gain of 4-7 ounces (113-198 g) per week after age 4 days.  Consistent daily weight gain by age 5 days, without weight loss after the age of 2 weeks. After a feeding, your baby may spit up a small amount. This is common. BREASTFEEDING FREQUENCY AND DURATION Frequent feeding will help you make more milk and can prevent sore nipples and breast engorgement. Breastfeed when you feel the need to reduce the fullness of your breasts or when your baby shows signs of hunger. This is called "breastfeeding on demand." Avoid introducing a pacifier to your baby while you are working to establish breastfeeding (the first 4-6 weeks   after your baby is born). After this time you may choose to use a pacifier. Research has shown that pacifier use during the first year of a baby's life decreases the risk of sudden infant death syndrome (SIDS). Allow your baby to feed on each breast as long as he or she wants. Breastfeed until your baby is finished feeding. When your baby unlatches or falls asleep while feeding from the first breast, offer the second breast. Because newborns are often sleepy in the first few weeks of life, you may need to awaken your baby to get him or her to feed. Breastfeeding times will vary from baby to baby. However, the following rules can serve as a guide to help you ensure that your baby is properly fed:  Newborns (babies 4 weeks of age or younger) may breastfeed every 1-3 hours.  Newborns should not go longer than 3 hours  during the day or 5 hours during the night without breastfeeding.  You should breastfeed your baby a minimum of 8 times in a 24-hour period until you begin to introduce solid foods to your baby at around 6 months of age. BREAST MILK PUMPING Pumping and storing breast milk allows you to ensure that your baby is exclusively fed your breast milk, even at times when you are unable to breastfeed. This is especially important if you are going back to work while you are still breastfeeding or when you are not able to be present during feedings. Your lactation consultant can give you guidelines on how long it is safe to store breast milk. A breast pump is a machine that allows you to pump milk from your breast into a sterile bottle. The pumped breast milk can then be stored in a refrigerator or freezer. Some breast pumps are operated by hand, while others use electricity. Ask your lactation consultant which type will work best for you. Breast pumps can be purchased, but some hospitals and breastfeeding support groups lease breast pumps on a monthly basis. A lactation consultant can teach you how to hand express breast milk, if you prefer not to use a pump. CARING FOR YOUR BREASTS WHILE YOU BREASTFEED Nipples can become dry, cracked, and sore while breastfeeding. The following recommendations can help keep your breasts moisturized and healthy:  Avoid using soap on your nipples.  Wear a supportive bra. Although not required, special nursing bras and tank tops are designed to allow access to your breasts for breastfeeding without taking off your entire bra or top. Avoid wearing underwire-style bras or extremely tight bras.  Air dry your nipples for 3-4minutes after each feeding.  Use only cotton bra pads to absorb leaked breast milk. Leaking of breast milk between feedings is normal.  Use lanolin on your nipples after breastfeeding. Lanolin helps to maintain your skin's normal moisture barrier. If you use  pure lanolin, you do not need to wash it off before feeding your baby again. Pure lanolin is not toxic to your baby. You may also hand express a few drops of breast milk and gently massage that milk into your nipples and allow the milk to air dry. In the first few weeks after giving birth, some women experience extremely full breasts (engorgement). Engorgement can make your breasts feel heavy, warm, and tender to the touch. Engorgement peaks within 3-5 days after you give birth. The following recommendations can help ease engorgement:  Completely empty your breasts while breastfeeding or pumping. You may want to start by applying warm, moist heat (in   the shower or with warm water-soaked hand towels) just before feeding or pumping. This increases circulation and helps the milk flow. If your baby does not completely empty your breasts while breastfeeding, pump any extra milk after he or she is finished.  Wear a snug bra (nursing or regular) or tank top for 1-2 days to signal your body to slightly decrease milk production.  Apply ice packs to your breasts, unless this is too uncomfortable for you.  Make sure that your baby is latched on and positioned properly while breastfeeding. If engorgement persists after 48 hours of following these recommendations, contact your health care provider or a Science writer. OVERALL HEALTH CARE RECOMMENDATIONS WHILE BREASTFEEDING  Eat healthy foods. Alternate between meals and snacks, eating 3 of each per day. Because what you eat affects your breast milk, some of the foods may make your baby more irritable than usual. Avoid eating these foods if you are sure that they are negatively affecting your baby.  Drink milk, fruit juice, and water to satisfy your thirst (about 10 glasses a day).  Rest often, relax, and continue to take your prenatal vitamins to prevent fatigue, stress, and anemia.  Continue breast self-awareness checks.  Avoid chewing and smoking  tobacco. Chemicals from cigarettes that pass into breast milk and exposure to secondhand smoke may harm your baby.  Avoid alcohol and drug use, including marijuana. Some medicines that may be harmful to your baby can pass through breast milk. It is important to ask your health care provider before taking any medicine, including all over-the-counter and prescription medicine as well as vitamin and herbal supplements. It is possible to become pregnant while breastfeeding. If birth control is desired, ask your health care provider about options that will be safe for your baby. SEEK MEDICAL CARE IF:  You feel like you want to stop breastfeeding or have become frustrated with breastfeeding.  You have painful breasts or nipples.  Your nipples are cracked or bleeding.  Your breasts are red, tender, or warm.  You have a swollen area on either breast.  You have a fever or chills.  You have nausea or vomiting.  You have drainage other than breast milk from your nipples.  Your breasts do not become full before feedings by the fifth day after you give birth.  You feel sad and depressed.  Your baby is too sleepy to eat well.  Your baby is having trouble sleeping.   Your baby is wetting less than 3 diapers in a 24-hour period.  Your baby has less than 3 stools in a 24-hour period.  Your baby's skin or the white part of his or her eyes becomes yellow.   Your baby is not gaining weight by 74 days of age. SEEK IMMEDIATE MEDICAL CARE IF:  Your baby is overly tired (lethargic) and does not want to wake up and feed.  Your baby develops an unexplained fever.   This information is not intended to replace advice given to you by your health care provider. Make sure you discuss any questions you have with your health care provider.   Document Released: 11/28/2005 Document Revised: 08/19/2015 Document Reviewed: 05/22/2013 Elsevier Interactive Patient Education 2016 [employer].  Preterm  Labor Information Preterm labor is when labor starts at less than 37 weeks of pregnancy. The normal length of a pregnancy is 39 to 41 weeks. CAUSES Often, there is no identifiable underlying cause as to why a woman goes into preterm labor. One of the most common  known causes of preterm labor is infection. Infections of the uterus, cervix, vagina, amniotic sac, bladder, kidney, or even the lungs (pneumonia) can cause labor to start. Other suspected causes of preterm labor include:   Urogenital infections, such as yeast infections and bacterial vaginosis.   Uterine abnormalities (uterine shape, uterine septum, fibroids, or bleeding from the placenta).   A cervix that has been operated on (it may fail to stay closed).   Malformations in the fetus.   Multiple gestations (twins, triplets, and so on).   Breakage of the amniotic sac.  RISK FACTORS  Having a previous history of preterm labor.   Having premature rupture of membranes (PROM).   Having a placenta that covers the opening of the cervix (placenta previa).   Having a placenta that separates from the uterus (placental abruption).   Having a cervix that is too weak to hold the fetus in the uterus (incompetent cervix).   Having too much fluid in the amniotic sac (polyhydramnios).   Taking illegal drugs or smoking while pregnant.   Not gaining enough weight while pregnant.   Being younger than 45 and older than 32 years old.   Having a low socioeconomic status.   Being African American. SYMPTOMS Signs and symptoms of preterm labor include:   Menstrual-like cramps, abdominal pain, or back pain.  Uterine contractions that are regular, as frequent as six in an hour, regardless of their intensity (may be mild or painful).  Contractions that start on the top of the uterus and spread down to the lower abdomen and back.   A sense of increased pelvic pressure.   A watery or bloody mucus discharge that comes  from the vagina.  TREATMENT Depending on the length of the pregnancy and other circumstances, your health care provider may suggest bed rest. If necessary, there are medicines that can be given to stop contractions and to mature the fetal lungs. If labor happens before 34 weeks of pregnancy, a prolonged hospital stay may be recommended. Treatment depends on the condition of both you and the fetus.  WHAT SHOULD YOU DO IF YOU THINK YOU ARE IN PRETERM LABOR? Call your health care provider right away. You will need to go to the hospital to get checked immediately. HOW CAN YOU PREVENT PRETERM LABOR IN FUTURE PREGNANCIES? You should:   Stop smoking if you smoke.  Maintain healthy weight gain and avoid chemicals and drugs that are not necessary.  Be watchful for any type of infection.  Inform your health care provider if you have a known history of preterm labor.   This information is not intended to replace advice given to you by your health care provider. Make sure you discuss any questions you have with your health care provider.   Document Released: 02/18/2004 Document Revised: 07/31/2013 Document Reviewed: 12/31/2012 Elsevier Interactive Patient Education [employer] Contraception Choices Contraception (birth control) is the use of any methods or devices to prevent pregnancy. Below are some methods to help avoid pregnancy. HORMONAL METHODS   Contraceptive implant. This is a thin, plastic tube containing progesterone hormone. It does not contain estrogen hormone. Your health care provider inserts the tube in the inner part of the upper arm. The tube can remain in place for up to 3 years. After 3 years, the implant must be removed. The implant prevents the ovaries from releasing an egg (ovulation), thickens the cervical mucus to prevent sperm from entering the uterus, and thins the lining of the inside of the  uterus.  Progesterone-only injections. These injections are given every 3  months by your health care provider to prevent pregnancy. This synthetic progesterone hormone stops the ovaries from releasing eggs. It also thickens cervical mucus and changes the uterine lining. This makes it harder for sperm to survive in the uterus.  Birth control pills. These pills contain estrogen and progesterone hormone. They work by preventing the ovaries from releasing eggs (ovulation). They also cause the cervical mucus to thicken, preventing the sperm from entering the uterus. Birth control pills are prescribed by a health care provider.Birth control pills can also be used to treat heavy periods.  Minipill. This type of birth control pill contains only the progesterone hormone. They are taken every day of each month and must be prescribed by your health care provider.  Birth control patch. The patch contains hormones similar to those in birth control pills. It must be changed once a week and is prescribed by a health care provider.  Vaginal ring. The ring contains hormones similar to those in birth control pills. It is left in the vagina for 3 weeks, removed for 1 week, and then a new one is put back in place. The patient must be comfortable inserting and removing the ring from the vagina.A health care provider's prescription is necessary.  Emergency contraception. Emergency contraceptives prevent pregnancy after unprotected sexual intercourse. This pill can be taken right after sex or up to 5 days after unprotected sex. It is most effective the sooner you take the pills after having sexual intercourse. Most emergency contraceptive pills are available without a prescription. Check with your pharmacist. Do not use emergency contraception as your only form of birth control. BARRIER METHODS   Female condom. This is a thin sheath (latex or rubber) that is worn over the penis during sexual intercourse. It can be used with spermicide to increase effectiveness.  Female condom. This is a soft,  loose-fitting sheath that is put into the vagina before sexual intercourse.  Diaphragm. This is a soft, latex, dome-shaped barrier that must be fitted by a health care provider. It is inserted into the vagina, along with a spermicidal jelly. It is inserted before intercourse. The diaphragm should be left in the vagina for 6 to 8 hours after intercourse.  Cervical cap. This is a round, soft, latex or plastic cup that fits over the cervix and must be fitted by a health care provider. The cap can be left in place for up to 48 hours after intercourse.  Sponge. This is a soft, circular piece of polyurethane foam. The sponge has spermicide in it. It is inserted into the vagina after wetting it and before sexual intercourse.  Spermicides. These are chemicals that kill or block sperm from entering the cervix and uterus. They come in the form of creams, jellies, suppositories, foam, or tablets. They do not require a prescription. They are inserted into the vagina with an applicator before having sexual intercourse. The process must be repeated every time you have sexual intercourse. INTRAUTERINE CONTRACEPTION  Intrauterine device (IUD). This is a T-shaped device that is put in a woman's uterus during a menstrual period to prevent pregnancy. There are 2 types:  Copper IUD. This type of IUD is wrapped in copper wire and is placed inside the uterus. Copper makes the uterus and fallopian tubes produce a fluid that kills sperm. It can stay in place for 10 years.  Hormone IUD. This type of IUD contains the hormone progestin (synthetic progesterone). The hormone  thickens the cervical mucus and prevents sperm from entering the uterus, and it also thins the uterine lining to prevent implantation of a fertilized egg. The hormone can weaken or kill the sperm that get into the uterus. It can stay in place for 3-5 years, depending on which type of IUD is used. PERMANENT METHODS OF CONTRACEPTION  Female tubal ligation.  This is when the woman's fallopian tubes are surgically sealed, tied, or blocked to prevent the egg from traveling to the uterus.  Hysteroscopic sterilization. This involves placing a small coil or insert into each fallopian tube. Your doctor uses a technique called hysteroscopy to do the procedure. The device causes scar tissue to form. This results in permanent blockage of the fallopian tubes, so the sperm cannot fertilize the egg. It takes about 3 months after the procedure for the tubes to become blocked. You must use another form of birth control for these 3 months.  Female sterilization. This is when the female has the tubes that carry sperm tied off (vasectomy).This blocks sperm from entering the vagina during sexual intercourse. After the procedure, the man can still ejaculate fluid (semen). NATURAL PLANNING METHODS  Natural family planning. This is not having sexual intercourse or using a barrier method (condom, diaphragm, cervical cap) on days the woman could become pregnant.  Calendar method. This is keeping track of the length of each menstrual cycle and identifying when you are fertile.  Ovulation method. This is avoiding sexual intercourse during ovulation.  Symptothermal method. This is avoiding sexual intercourse during ovulation, using a thermometer and ovulation symptoms.  Post-ovulation method. This is timing sexual intercourse after you have ovulated. Regardless of which type or method of contraception you choose, it is important that you use condoms to protect against the transmission of sexually transmitted infections (STIs). Talk with your health care provider about which form of contraception is most appropriate for you.   This information is not intended to replace advice given to you by your health care provider. Make sure you discuss any questions you have with your health care provider.   Document Released: 11/28/2005 Document Revised: 12/03/2013 Document Reviewed:  05/23/2013 Elsevier Interactive Patient Education [employer]

## 2015-10-14 ENCOUNTER — Encounter: Payer: Self-pay | Admitting: Family Medicine

## 2015-10-14 LAB — RPR

## 2015-10-14 LAB — GLUCOSE TOLERANCE, 1 HOUR (50G) W/O FASTING: Glucose, 1 Hour GTT: 128 mg/dL (ref 70–140)

## 2015-10-14 LAB — HIV ANTIBODY (ROUTINE TESTING W REFLEX): HIV 1&2 Ab, 4th Generation: NONREACTIVE

## 2015-10-15 ENCOUNTER — Encounter: Payer: Self-pay | Admitting: Family Medicine

## 2015-10-27 ENCOUNTER — Ambulatory Visit (INDEPENDENT_AMBULATORY_CARE_PROVIDER_SITE_OTHER): Payer: BC Managed Care – PPO | Admitting: Family Medicine

## 2015-10-27 VITALS — BP 111/76 | HR 102 | Wt 156.0 lb

## 2015-10-27 DIAGNOSIS — Z3403 Encounter for supervision of normal first pregnancy, third trimester: Secondary | ICD-10-CM

## 2015-10-27 NOTE — Progress Notes (Signed)
Subjective:  Danielle Esparza is a 34 y.o. G1P0000 at 1446w6d being seen today for ongoing prenatal care.  Patient reports no complaints.  Contractions: Not present.  Vag. Bleeding: None. Movement: Present. Denies leaking of fluid.   The following portions of the patient's history were reviewed and updated as appropriate: allergies, current medications, past family history, past medical history, past social history, past surgical history and problem list. Problem list updated.  Objective:   Filed Vitals:   10/27/15 0841  BP: 111/76  Pulse: 102  Weight: 156 lb (70.761 kg)    Fetal Status: Fetal Heart Rate (bpm): 150 Fundal Height: 30 cm Movement: Present     General:  Alert, oriented and cooperative. Patient is in no acute distress.  Skin: Skin is warm and dry. No rash noted.   Cardiovascular: Normal heart rate noted  Respiratory: Normal respiratory effort, no problems with respiration noted  Abdomen: Soft, gravid, appropriate for gestational age. Pain/Pressure: Absent     Pelvic: Vag. Bleeding: None Vag D/C Character: Thin   Cervical exam deferred        Extremities: Normal range of motion.  Edema: None  Mental Status: Normal mood and affect. Normal behavior. Normal judgment and thought content.   Urinalysis: Urine Protein: Negative Urine Glucose: Negative  Assessment and Plan:  Pregnancy: G1P0000 at 6646w6d  1. Encounter for supervision of normal first pregnancy in third trimester Continue routine prenatal care. Discussed options for pain control.  Preterm labor symptoms and general obstetric precautions including but not limited to vaginal bleeding, contractions, leaking of fluid and fetal movement were reviewed in detail with the patient. Please refer to After Visit Summary for other counseling recommendations.  Return in 2 weeks (on 11/10/2015).   Reva Boresanya S Natiya Seelinger, MD

## 2015-10-27 NOTE — Patient Instructions (Signed)
Breastfeeding Deciding to breastfeed is one of the best choices you can make for you and your baby. A change in hormones during pregnancy causes your breast tissue to grow and increases the number and size of your milk ducts. These hormones also allow proteins, sugars, and fats from your blood supply to make breast milk in your milk-producing glands. Hormones prevent breast milk from being released before your baby is born as well as prompt milk flow after birth. Once breastfeeding has begun, thoughts of your baby, as well as his or her sucking or crying, can stimulate the release of milk from your milk-producing glands.  BENEFITS OF BREASTFEEDING For Your Baby  Your first milk (colostrum) helps your baby's digestive system function better.  There are antibodies in your milk that help your baby fight off infections.  Your baby has a lower incidence of asthma, allergies, and sudden infant death syndrome.  The nutrients in breast milk are better for your baby than infant formulas and are designed uniquely for your baby's needs.  Breast milk improves your baby's brain development.  Your baby is less likely to develop other conditions, such as childhood obesity, asthma, or type 2 diabetes mellitus. For You  Breastfeeding helps to create a very special bond between you and your baby.  Breastfeeding is convenient. Breast milk is always available at the correct temperature and costs nothing.  Breastfeeding helps to burn calories and helps you lose the weight gained during pregnancy.  Breastfeeding makes your uterus contract to its prepregnancy size faster and slows bleeding (lochia) after you give birth.   Breastfeeding helps to lower your risk of developing type 2 diabetes mellitus, osteoporosis, and breast or ovarian cancer later in life. SIGNS THAT YOUR BABY IS HUNGRY Early Signs of Hunger  Increased alertness or activity.  Stretching.  Movement of the head from side to  side.  Movement of the head and opening of the mouth when the corner of the mouth or cheek is stroked (rooting).  Increased sucking sounds, smacking lips, cooing, sighing, or squeaking.  Hand-to-mouth movements.  Increased sucking of fingers or hands. Late Signs of Hunger  Fussing.  Intermittent crying. Extreme Signs of Hunger Signs of extreme hunger will require calming and consoling before your baby will be able to breastfeed successfully. Do not wait for the following signs of extreme hunger to occur before you initiate breastfeeding:  Restlessness.  A loud, strong cry.  Screaming. BREASTFEEDING BASICS Breastfeeding Initiation  Find a comfortable place to sit or lie down, with your neck and back well supported.  Place a pillow or rolled up blanket under your baby to bring him or her to the level of your breast (if you are seated). Nursing pillows are specially designed to help support your arms and your baby while you breastfeed.  Make sure that your baby's abdomen is facing your abdomen.  Gently massage your breast. With your fingertips, massage from your chest wall toward your nipple in a circular motion. This encourages milk flow. You may need to continue this action during the feeding if your milk flows slowly.  Support your breast with 4 fingers underneath and your thumb above your nipple. Make sure your fingers are well away from your nipple and your baby's mouth.  Stroke your baby's lips gently with your finger or nipple.  When your baby's mouth is open wide enough, quickly bring your baby to your breast, placing your entire nipple and as much of the colored area around your nipple (  areola) as possible into your baby's mouth.  More areola should be visible above your baby's upper lip than below the lower lip.  Your baby's tongue should be between his or her lower gum and your breast.  Ensure that your baby's mouth is correctly positioned around your nipple  (latched). Your baby's lips should create a seal on your breast and be turned out (everted).  It is common for your baby to suck about 2-3 minutes in order to start the flow of breast milk. Latching Teaching your baby how to latch on to your breast properly is very important. An improper latch can cause nipple pain and decreased milk supply for you and poor weight gain in your baby. Also, if your baby is not latched onto your nipple properly, he or she may swallow some air during feeding. This can make your baby fussy. Burping your baby when you switch breasts during the feeding can help to get rid of the air. However, teaching your baby to latch on properly is still the best way to prevent fussiness from swallowing air while breastfeeding. Signs that your baby has successfully latched on to your nipple:  Silent tugging or silent sucking, without causing you pain.  Swallowing heard between every 3-4 sucks.  Muscle movement above and in front of his or her ears while sucking. Signs that your baby has not successfully latched on to nipple:  Sucking sounds or smacking sounds from your baby while breastfeeding.  Nipple pain. If you think your baby has not latched on correctly, slip your finger into the corner of your baby's mouth to break the suction and place it between your baby's gums. Attempt breastfeeding initiation again. Signs of Successful Breastfeeding Signs from your baby:  A gradual decrease in the number of sucks or complete cessation of sucking.  Falling asleep.  Relaxation of his or her body.  Retention of a small amount of milk in his or her mouth.  Letting go of your breast by himself or herself. Signs from you:  Breasts that have increased in firmness, weight, and size 1-3 hours after feeding.  Breasts that are softer immediately after breastfeeding.  Increased milk volume, as well as a change in milk consistency and color by the fifth day of breastfeeding.  Nipples  that are not sore, cracked, or bleeding. Signs That Your Baby is Getting Enough Milk  Wetting at least 3 diapers in a 24-hour period. The urine should be clear and pale yellow by age 5 days.  At least 3 stools in a 24-hour period by age 5 days. The stool should be soft and yellow.  At least 3 stools in a 24-hour period by age 7 days. The stool should be seedy and yellow.  No loss of weight greater than 10% of birth weight during the first 3 days of age.  Average weight gain of 4-7 ounces (113-198 g) per week after age 4 days.  Consistent daily weight gain by age 5 days, without weight loss after the age of 2 weeks. After a feeding, your baby may spit up a small amount. This is common. BREASTFEEDING FREQUENCY AND DURATION Frequent feeding will help you make more milk and can prevent sore nipples and breast engorgement. Breastfeed when you feel the need to reduce the fullness of your breasts or when your baby shows signs of hunger. This is called "breastfeeding on demand." Avoid introducing a pacifier to your baby while you are working to establish breastfeeding (the first 4-6 weeks   after your baby is born). After this time you may choose to use a pacifier. Research has shown that pacifier use during the first year of a baby's life decreases the risk of sudden infant death syndrome (SIDS). Allow your baby to feed on each breast as long as he or she wants. Breastfeed until your baby is finished feeding. When your baby unlatches or falls asleep while feeding from the first breast, offer the second breast. Because newborns are often sleepy in the first few weeks of life, you may need to awaken your baby to get him or her to feed. Breastfeeding times will vary from baby to baby. However, the following rules can serve as a guide to help you ensure that your baby is properly fed:  Newborns (babies 4 weeks of age or younger) may breastfeed every 1-3 hours.  Newborns should not go longer than 3 hours  during the day or 5 hours during the night without breastfeeding.  You should breastfeed your baby a minimum of 8 times in a 24-hour period until you begin to introduce solid foods to your baby at around 6 months of age. BREAST MILK PUMPING Pumping and storing breast milk allows you to ensure that your baby is exclusively fed your breast milk, even at times when you are unable to breastfeed. This is especially important if you are going back to work while you are still breastfeeding or when you are not able to be present during feedings. Your lactation consultant can give you guidelines on how long it is safe to store breast milk. A breast pump is a machine that allows you to pump milk from your breast into a sterile bottle. The pumped breast milk can then be stored in a refrigerator or freezer. Some breast pumps are operated by hand, while others use electricity. Ask your lactation consultant which type will work best for you. Breast pumps can be purchased, but some hospitals and breastfeeding support groups lease breast pumps on a monthly basis. A lactation consultant can teach you how to hand express breast milk, if you prefer not to use a pump. CARING FOR YOUR BREASTS WHILE YOU BREASTFEED Nipples can become dry, cracked, and sore while breastfeeding. The following recommendations can help keep your breasts moisturized and healthy:  Avoid using soap on your nipples.  Wear a supportive bra. Although not required, special nursing bras and tank tops are designed to allow access to your breasts for breastfeeding without taking off your entire bra or top. Avoid wearing underwire-style bras or extremely tight bras.  Air dry your nipples for 3-4minutes after each feeding.  Use only cotton bra pads to absorb leaked breast milk. Leaking of breast milk between feedings is normal.  Use lanolin on your nipples after breastfeeding. Lanolin helps to maintain your skin's normal moisture barrier. If you use  pure lanolin, you do not need to wash it off before feeding your baby again. Pure lanolin is not toxic to your baby. You may also hand express a few drops of breast milk and gently massage that milk into your nipples and allow the milk to air dry. In the first few weeks after giving birth, some women experience extremely full breasts (engorgement). Engorgement can make your breasts feel heavy, warm, and tender to the touch. Engorgement peaks within 3-5 days after you give birth. The following recommendations can help ease engorgement:  Completely empty your breasts while breastfeeding or pumping. You may want to start by applying warm, moist heat (in   the shower or with warm water-soaked hand towels) just before feeding or pumping. This increases circulation and helps the milk flow. If your baby does not completely empty your breasts while breastfeeding, pump any extra milk after he or she is finished.  Wear a snug bra (nursing or regular) or tank top for 1-2 days to signal your body to slightly decrease milk production.  Apply ice packs to your breasts, unless this is too uncomfortable for you.  Make sure that your baby is latched on and positioned properly while breastfeeding. If engorgement persists after 48 hours of following these recommendations, contact your health care provider or a lactation consultant. OVERALL HEALTH CARE RECOMMENDATIONS WHILE BREASTFEEDING  Eat healthy foods. Alternate between meals and snacks, eating 3 of each per day. Because what you eat affects your breast milk, some of the foods may make your baby more irritable than usual. Avoid eating these foods if you are sure that they are negatively affecting your baby.  Drink milk, fruit juice, and water to satisfy your thirst (about 10 glasses a day).  Rest often, relax, and continue to take your prenatal vitamins to prevent fatigue, stress, and anemia.  Continue breast self-awareness checks.  Avoid chewing and smoking  tobacco. Chemicals from cigarettes that pass into breast milk and exposure to secondhand smoke may harm your baby.  Avoid alcohol and drug use, including marijuana. Some medicines that may be harmful to your baby can pass through breast milk. It is important to ask your health care provider before taking any medicine, including all over-the-counter and prescription medicine as well as vitamin and herbal supplements. It is possible to become pregnant while breastfeeding. If birth control is desired, ask your health care provider about options that will be safe for your baby. SEEK MEDICAL CARE IF:  You feel like you want to stop breastfeeding or have become frustrated with breastfeeding.  You have painful breasts or nipples.  Your nipples are cracked or bleeding.  Your breasts are red, tender, or warm.  You have a swollen area on either breast.  You have a fever or chills.  You have nausea or vomiting.  You have drainage other than breast milk from your nipples.  Your breasts do not become full before feedings by the fifth day after you give birth.  You feel sad and depressed.  Your baby is too sleepy to eat well.  Your baby is having trouble sleeping.   Your baby is wetting less than 3 diapers in a 24-hour period.  Your baby has less than 3 stools in a 24-hour period.  Your baby's skin or the white part of his or her eyes becomes yellow.   Your baby is not gaining weight by 5 days of age. SEEK IMMEDIATE MEDICAL CARE IF:  Your baby is overly tired (lethargic) and does not want to wake up and feed.  Your baby develops an unexplained fever.   This information is not intended to replace advice given to you by your health care provider. Make sure you discuss any questions you have with your health care provider.   Document Released: 11/28/2005 Document Revised: 08/19/2015 Document Reviewed: 05/22/2013 Elsevier Interactive Patient Education 2016 [employer]  

## 2015-11-10 ENCOUNTER — Ambulatory Visit (INDEPENDENT_AMBULATORY_CARE_PROVIDER_SITE_OTHER): Payer: BC Managed Care – PPO | Admitting: Physician Assistant

## 2015-11-10 VITALS — BP 113/75 | HR 97 | Wt 163.0 lb

## 2015-11-10 DIAGNOSIS — Z3403 Encounter for supervision of normal first pregnancy, third trimester: Secondary | ICD-10-CM

## 2015-11-10 MED ORDER — BREAST PUMP MISC: Status: DC

## 2015-11-10 NOTE — Progress Notes (Signed)
Subjective:  Danielle Esparza is a 34 y.o. G1P0000 at [redacted]w[redacted]d being seen today for ongoing prenatal care.  Patient reports no complaints.  Contractions: Not present.  Vag. Bleeding: None. Movement: Present. Denies leaking of fluid.   The following portions of the patient's history were reviewed and updated as appropriate: allergies, current medications, past family history, past medical history, past social history, past surgical history and problem list.   Objective:   Filed Vitals:   11/10/15 1011  BP: 113/75  Pulse: 97  Weight: 163 lb (73.936 kg)    Fetal Status: Fetal Heart Rate (bpm): 153 Fundal Height: 32 cm Movement: Present     General:  Alert, oriented and cooperative. Patient is in no acute distress.  Skin: Skin is warm and dry. No rash noted.   Cardiovascular: Normal heart rate noted  Respiratory: Normal respiratory effort, no problems with respiration noted  Abdomen: Soft, gravid, appropriate for gestational age. Pain/Pressure: Absent     Pelvic: Vag. Bleeding: None Vag D/C Character: Thin   Cervical exam deferred        Extremities: Normal range of motion.  Edema: None  Mental Status: Normal mood and affect. Normal behavior. Normal judgment and thought content.   Urinalysis: Urine Protein: Negative Urine Glucose: 2+  Assessment and Plan:  Pregnancy: G1P0000 at 774w6d  1. Encounter for supervision of normal first pregnancy in third trimester PNV qd Cont routine PNC - Misc. Devices (BREAST PUMP) MISC; Use as directed.  Dispense: 1 each; Refill: 0 Discussed preregistration, FMLA  Preterm labor symptoms and general obstetric precautions including but not limited to vaginal bleeding, contractions, leaking of fluid and fetal movement were reviewed in detail with the patient. Please refer to After Visit Summary for other counseling recommendations.  Return in about 2 weeks (around 11/24/2015) for OBF.   Bertram DenverKaren E Teague Clark, PA-C

## 2015-11-10 NOTE — Patient Instructions (Signed)
Pain Relief During Labor and Delivery Everyone experiences pain differently, but labor causes severe pain for many women. The amount of pain you experience during labor and delivery depends on your pain tolerance, contraction strength, and your baby's size and position. There are many ways to prepare for and deal with the pain, including:   Taking prenatal classes to learn about labor and delivery. The more informed you are, the less anxious and afraid you may be. This can help lessen the pain.  Taking pain-relieving medicine during labor and delivery.  Learning breathing and relaxation techniques.  Taking a shower or bath.  Getting massaged.  Changing positions.  Placing an ice pack on your back. Discuss your pain control options with your health care provider during your prenatal visits.  WHAT ARE THE TWO TYPES OF PAIN-RELIEVING MEDICINES? 1. Analgesics. These are medicines that decrease pain without total loss of feeling or muscle movement. 2. Anesthetics. These are medicines that block all feeling, including pain. There can be minor side effects of both types, such as nausea, trouble concentrating, becoming sleepy, and lowering the heart rate of the baby. However, health care providers are careful to give doses that will not seriously affect the baby.  WHAT ARE THE SPECIFIC TYPES OF ANALGESICS AND ANESTHETICS? Systemic Analgesic Systemic pain medicines affect your whole body rather than focusing pain relief on the area of your body experiencing pain. This type of medicine is given either through an IV tube in your vein or by a shot (injection) into your muscle. This medicine will lessen your pain but will not stop it completely. It may also make you sleepy, but it will not make you lose consciousness.  Local Anesthetic Local anesthetic isused tonumb a small area of your body. The medicine is injected into the area of nerves that carry feeling to the vagina, vulva, or the area between  the vagina and anus (perineum).  General Anesthetic This type of medicine causes you to lose consciousness so you do not feel pain. It is usually used only in emergency situations during labor. It is given through an IV tube or face mask. Paracervical Block A paracervical block is a form of local anesthesia given during labor. Numbing medicine is injected into the right and left sides of the cervix and vagina. It helps to lessen the pain caused by contractions and stretching of the cervix. It may have to be given more than once.  Pudendal Block A pudendal block is another form of local anesthesia. It is used to relieve the pain associated with pushing or stretching of the perineum at the time of delivery. An injection is given deep through the vaginal wall into the pudendal nerve in the pelvis, numbing the perineum.  Epidural Anesthetic An epidural is an injection of numbing medicine given in the lower back and into the epidural space near your spinal cord. The epidural numbs the lower half of your body. You may be able to move your legs but will not be allowed to walk. Epidurals can be used for labor, delivery, or cesarean deliveries.  To prevent the medicine from wearing off, a small tube (catheter) may be threaded into the epidural space and taped in place to prevent it from slipping out. Medicine can then be given continuously in small doses through the tube until you deliver. Spinal Block A spinal block is similar to an epidural, but the medicine is injected into the spinal fluid, not the epidural space. A spinal block is only given   once. It starts to relieve pain quickly but lasts only 1-2 hours. Spinal blocks can also be used for cesarean deliveries.  Combined Spinal-Epidural Block Combined spinal-epidural blocks combine the benefits of both the spinal and epidural blocks. The spinal part acts quickly to relieve pain and the epidural provides continuous pain relief. Hydrotherapy Immersion in  warm water during labor may provide comfort and relaxation. It may also help to lessen pain, the use of anesthesia, and the length of labor. However, immersion in water during the delivery (water birth) may have some risk involved and studies to determine safety and risks are ongoing. If you are a healthy woman who is expecting an uncomplicated birth, talk with your health care provider to see if water birth is an option for you.    This information is not intended to replace advice given to you by your health care provider. Make sure you discuss any questions you have with your health care provider.   Document Released: 03/16/2009 Document Revised: 12/03/2013 Document Reviewed: 04/18/2013 Elsevier Interactive Patient Education 2016 [employer]  

## 2015-11-24 ENCOUNTER — Encounter: Payer: Self-pay | Admitting: *Deleted

## 2015-11-24 ENCOUNTER — Encounter: Payer: BC Managed Care – PPO | Admitting: Obstetrics & Gynecology

## 2015-11-25 ENCOUNTER — Encounter: Payer: Self-pay | Admitting: Family Medicine

## 2015-11-25 ENCOUNTER — Ambulatory Visit (INDEPENDENT_AMBULATORY_CARE_PROVIDER_SITE_OTHER): Payer: BC Managed Care – PPO | Admitting: Family Medicine

## 2015-11-25 VITALS — BP 108/70 | HR 80 | Wt 166.0 lb

## 2015-11-25 DIAGNOSIS — Z3403 Encounter for supervision of normal first pregnancy, third trimester: Secondary | ICD-10-CM

## 2015-11-25 DIAGNOSIS — B001 Herpesviral vesicular dermatitis: Secondary | ICD-10-CM

## 2015-11-25 MED ORDER — VALACYCLOVIR HCL 1 G PO TABS: 1000.0000 mg | ORAL_TABLET | Freq: Every day | ORAL | Status: DC

## 2015-11-25 NOTE — Patient Instructions (Addendum)
Third Trimester of Pregnancy The third trimester is from week 29 through week 42, months 7 through 9. The third trimester is a time when the fetus is growing rapidly. At the end of the ninth month, the fetus is about 20 inches in length and weighs 6-10 pounds.  BODY CHANGES Your body goes through many changes during pregnancy. The changes vary from woman to woman.   Your weight will continue to increase. You can expect to gain 25-35 pounds (11-16 kg) by the end of the pregnancy.  You may begin to get stretch marks on your hips, abdomen, and breasts.  You may urinate more often because the fetus is moving lower into your pelvis and pressing on your bladder.  You may develop or continue to have heartburn as a result of your pregnancy.  You may develop constipation because certain hormones are causing the muscles that push waste through your intestines to slow down.  You may develop hemorrhoids or swollen, bulging veins (varicose veins).  You may have pelvic pain because of the weight gain and pregnancy hormones relaxing your joints between the bones in your pelvis. Backaches may result from overexertion of the muscles supporting your posture.  You may have changes in your hair. These can include thickening of your hair, rapid growth, and changes in texture. Some women also have hair loss during or after pregnancy, or hair that feels dry or thin. Your hair will most likely return to normal after your baby is born.  Your breasts will continue to grow and be tender. A yellow discharge may leak from your breasts called colostrum.  Your belly button may stick out.  You may feel short of breath because of your expanding uterus.  You may notice the fetus "dropping," or moving lower in your abdomen.  You may have a bloody mucus discharge. This usually occurs a few days to a week before labor begins.  Your cervix becomes thin and soft (effaced) near your due date. WHAT TO EXPECT AT YOUR  PRENATAL EXAMS  You will have prenatal exams every 2 weeks until week 36. Then, you will have weekly prenatal exams. During a routine prenatal visit:  You will be weighed to make sure you and the fetus are growing normally.  Your blood pressure is taken.  Your abdomen will be measured to track your baby's growth.  The fetal heartbeat will be listened to.  Any test results from the previous visit will be discussed.  You may have a cervical check near your due date to see if you have effaced. At around 36 weeks, your caregiver will check your cervix. At the same time, your caregiver will also perform a test on the secretions of the vaginal tissue. This test is to determine if a type of bacteria, Group B streptococcus, is present. Your caregiver will explain this further. Your caregiver may ask you:  What your birth plan is.  How you are feeling.  If you are feeling the baby move.  If you have had any abnormal symptoms, such as leaking fluid, bleeding, severe headaches, or abdominal cramping.  If you are using any tobacco products, including cigarettes, chewing tobacco, and electronic cigarettes.  If you have any questions. Other tests or screenings that may be performed during your third trimester include:  Blood tests that check for low iron levels (anemia).  Fetal testing to check the health, activity level, and growth of the fetus. Testing is done if you have certain medical conditions or if  there are problems during the pregnancy.  HIV (human immunodeficiency virus) testing. If you are at high risk, you may be screened for HIV during your third trimester of pregnancy. FALSE LABOR You may feel small, irregular contractions that eventually go away. These are called Braxton Hicks contractions, or false labor. Contractions may last for hours, days, or even weeks before true labor sets in. If contractions come at regular intervals, intensify, or become painful, it is best to be seen  by your caregiver.  SIGNS OF LABOR   Menstrual-like cramps.  Contractions that are 5 minutes apart or less.  Contractions that start on the top of the uterus and spread down to the lower abdomen and back.  A sense of increased pelvic pressure or back pain.  A watery or bloody mucus discharge that comes from the vagina. If you have any of these signs before the 37th week of pregnancy, call your caregiver right away. You need to go to the hospital to get checked immediately. HOME CARE INSTRUCTIONS   Avoid all smoking, herbs, alcohol, and unprescribed drugs. These chemicals affect the formation and growth of the baby.  Do not use any tobacco products, including cigarettes, chewing tobacco, and electronic cigarettes. If you need help quitting, ask your health care provider. You may receive counseling support and other resources to help you quit.  Follow your caregiver's instructions regarding medicine use. There are medicines that are either safe or unsafe to take during pregnancy.  Exercise only as directed by your caregiver. Experiencing uterine cramps is a good sign to stop exercising.  Continue to eat regular, healthy meals.  Wear a good support bra for breast tenderness.  Do not use hot tubs, steam rooms, or saunas.  Wear your seat belt at all times when driving.  Avoid raw meat, uncooked cheese, cat litter boxes, and soil used by cats. These carry germs that can cause birth defects in the baby.  Take your prenatal vitamins.  Take 1500-2000 mg of calcium daily starting at the 20th week of pregnancy until you deliver your baby.  Try taking a stool softener (if your caregiver approves) if you develop constipation. Eat more high-fiber foods, such as fresh vegetables or fruit and whole grains. Drink plenty of fluids to keep your urine clear or pale yellow.  Take warm sitz baths to soothe any pain or discomfort caused by hemorrhoids. Use hemorrhoid cream if your caregiver  approves.  If you develop varicose veins, wear support hose. Elevate your feet for 15 minutes, 3-4 times a day. Limit salt in your diet.  Avoid heavy lifting, wear low heal shoes, and practice good posture.  Rest a lot with your legs elevated if you have leg cramps or low back pain.  Visit your dentist if you have not gone during your pregnancy. Use a soft toothbrush to brush your teeth and be gentle when you floss.  A sexual relationship may be continued unless your caregiver directs you otherwise.  Do not travel far distances unless it is absolutely necessary and only with the approval of your caregiver.  Take prenatal classes to understand, practice, and ask questions about the labor and delivery.  Make a trial run to the hospital.  Pack your hospital bag.  Prepare the baby's nursery.  Continue to go to all your prenatal visits as directed by your caregiver. SEEK MEDICAL CARE IF:  You are unsure if you are in labor or if your water has broken.  You have dizziness.  You have  mild pelvic cramps, pelvic pressure, or nagging pain in your abdominal area.  You have persistent nausea, vomiting, or diarrhea.  You have a bad smelling vaginal discharge.  You have pain with urination. SEEK IMMEDIATE MEDICAL CARE IF:   You have a fever.  You are leaking fluid from your vagina.  You have spotting or bleeding from your vagina.  You have severe abdominal cramping or pain.  You have rapid weight loss or gain.  You have shortness of breath with chest pain.  You notice sudden or extreme swelling of your face, hands, ankles, feet, or legs.  You have not felt your baby move in over an hour.  You have severe headaches that do not go away with medicine.  You have vision changes.   This information is not intended to replace advice given to you by your health care provider. Make sure you discuss any questions you have with your health care provider.   Document Released:  11/22/2001 Document Revised: 12/19/2014 Document Reviewed: 01/29/2013 Elsevier Interactive Patient Education [employer].  Breastfeeding Deciding to breastfeed is one of the best choices you can make for you and your baby. A change in hormones during pregnancy causes your breast tissue to grow and increases the number and size of your milk ducts. These hormones also allow proteins, sugars, and fats from your blood supply to make breast milk in your milk-producing glands. Hormones prevent breast milk from being released before your baby is born as well as prompt milk flow after birth. Once breastfeeding has begun, thoughts of your baby, as well as his or her sucking or crying, can stimulate the release of milk from your milk-producing glands.  BENEFITS OF BREASTFEEDING For Your Baby  Your first milk (colostrum) helps your baby's digestive system function better.  There are antibodies in your milk that help your baby fight off infections.  Your baby has a lower incidence of asthma, allergies, and sudden infant death syndrome.  The nutrients in breast milk are better for your baby than infant formulas and are designed uniquely for your baby's needs.  Breast milk improves your baby's brain development.  Your baby is less likely to develop other conditions, such as childhood obesity, asthma, or type 2 diabetes mellitus. For You  Breastfeeding helps to create a very special bond between you and your baby.  Breastfeeding is convenient. Breast milk is always available at the correct temperature and costs nothing.  Breastfeeding helps to burn calories and helps you lose the weight gained during pregnancy.  Breastfeeding makes your uterus contract to its prepregnancy size faster and slows bleeding (lochia) after you give birth.   Breastfeeding helps to lower your risk of developing type 2 diabetes mellitus, osteoporosis, and breast or ovarian cancer later in life. SIGNS THAT YOUR BABY IS  HUNGRY Early Signs of Hunger  Increased alertness or activity.  Stretching.  Movement of the head from side to side.  Movement of the head and opening of the mouth when the corner of the mouth or cheek is stroked (rooting).  Increased sucking sounds, smacking lips, cooing, sighing, or squeaking.  Hand-to-mouth movements.  Increased sucking of fingers or hands. Late Signs of Hunger  Fussing.  Intermittent crying. Extreme Signs of Hunger Signs of extreme hunger will require calming and consoling before your baby will be able to breastfeed successfully. Do not wait for the following signs of extreme hunger to occur before you initiate breastfeeding:  Restlessness.  A loud, strong cry.  Screaming.  BREASTFEEDING BASICS Breastfeeding Initiation  Find a comfortable place to sit or lie down, with your neck and back well supported.  Place a pillow or rolled up blanket under your baby to bring him or her to the level of your breast (if you are seated). Nursing pillows are specially designed to help support your arms and your baby while you breastfeed.  Make sure that your baby's abdomen is facing your abdomen.  Gently massage your breast. With your fingertips, massage from your chest wall toward your nipple in a circular motion. This encourages milk flow. You may need to continue this action during the feeding if your milk flows slowly.  Support your breast with 4 fingers underneath and your thumb above your nipple. Make sure your fingers are well away from your nipple and your baby's mouth.  Stroke your baby's lips gently with your finger or nipple.  When your baby's mouth is open wide enough, quickly bring your baby to your breast, placing your entire nipple and as much of the colored area around your nipple (areola) as possible into your baby's mouth.  More areola should be visible above your baby's upper lip than below the lower lip.  Your baby's tongue should be between his  or her lower gum and your breast.  Ensure that your baby's mouth is correctly positioned around your nipple (latched). Your baby's lips should create a seal on your breast and be turned out (everted).  It is common for your baby to suck about 2-3 minutes in order to start the flow of breast milk. Latching Teaching your baby how to latch on to your breast properly is very important. An improper latch can cause nipple pain and decreased milk supply for you and poor weight gain in your baby. Also, if your baby is not latched onto your nipple properly, he or she may swallow some air during feeding. This can make your baby fussy. Burping your baby when you switch breasts during the feeding can help to get rid of the air. However, teaching your baby to latch on properly is still the best way to prevent fussiness from swallowing air while breastfeeding. Signs that your baby has successfully latched on to your nipple:  Silent tugging or silent sucking, without causing you pain.  Swallowing heard between every 3-4 sucks.  Muscle movement above and in front of his or her ears while sucking. Signs that your baby has not successfully latched on to nipple:  Sucking sounds or smacking sounds from your baby while breastfeeding.  Nipple pain. If you think your baby has not latched on correctly, slip your finger into the corner of your baby's mouth to break the suction and place it between your baby's gums. Attempt breastfeeding initiation again. Signs of Successful Breastfeeding Signs from your baby:  A gradual decrease in the number of sucks or complete cessation of sucking.  Falling asleep.  Relaxation of his or her body.  Retention of a small amount of milk in his or her mouth.  Letting go of your breast by himself or herself. Signs from you:  Breasts that have increased in firmness, weight, and size 1-3 hours after feeding.  Breasts that are softer immediately after  breastfeeding.  Increased milk volume, as well as a change in milk consistency and color by the fifth day of breastfeeding.  Nipples that are not sore, cracked, or bleeding. Signs That Your Baby is Getting Enough Milk  Wetting at least 3 diapers in a 24-hour period.   The urine should be clear and pale yellow by age 38 days.  At least 3 stools in a 24-hour period by age 38 days. The stool should be soft and yellow.  At least 3 stools in a 24-hour period by age 54 days. The stool should be seedy and yellow.  No loss of weight greater than 10% of birth weight during the first 77 days of age.  Average weight gain of 4-7 ounces (113-198 g) per week after age 39 days.  Consistent daily weight gain by age 3 days, without weight loss after the age of 2 weeks. After a feeding, your baby may spit up a small amount. This is common. BREASTFEEDING FREQUENCY AND DURATION Frequent feeding will help you make more milk and can prevent sore nipples and breast engorgement. Breastfeed when you feel the need to reduce the fullness of your breasts or when your baby shows signs of hunger. This is called "breastfeeding on demand." Avoid introducing a pacifier to your baby while you are working to establish breastfeeding (the first 4-6 weeks after your baby is born). After this time you may choose to use a pacifier. Research has shown that pacifier use during the first year of a baby's life decreases the risk of sudden infant death syndrome (SIDS). Allow your baby to feed on each breast as long as he or she wants. Breastfeed until your baby is finished feeding. When your baby unlatches or falls asleep while feeding from the first breast, offer the second breast. Because newborns are often sleepy in the first few weeks of life, you may need to awaken your baby to get him or her to feed. Breastfeeding times will vary from baby to baby. However, the following rules can serve as a guide to help you ensure that your baby is  properly fed:  Newborns (babies 25 weeks of age or younger) may breastfeed every 1-3 hours.  Newborns should not go longer than 3 hours during the day or 5 hours during the night without breastfeeding.  You should breastfeed your baby a minimum of 8 times in a 24-hour period until you begin to introduce solid foods to your baby at around 53 months of age. BREAST MILK PUMPING Pumping and storing breast milk allows you to ensure that your baby is exclusively fed your breast milk, even at times when you are unable to breastfeed. This is especially important if you are going back to work while you are still breastfeeding or when you are not able to be present during feedings. Your lactation consultant can give you guidelines on how long it is safe to store breast milk. A breast pump is a machine that allows you to pump milk from your breast into a sterile bottle. The pumped breast milk can then be stored in a refrigerator or freezer. Some breast pumps are operated by hand, while others use electricity. Ask your lactation consultant which type will work best for you. Breast pumps can be purchased, but some hospitals and breastfeeding support groups lease breast pumps on a monthly basis. A lactation consultant can teach you how to hand express breast milk, if you prefer not to use a pump. CARING FOR YOUR BREASTS WHILE YOU BREASTFEED Nipples can become dry, cracked, and sore while breastfeeding. The following recommendations can help keep your breasts moisturized and healthy:  Avoid using soap on your nipples.  Wear a supportive bra. Although not required, special nursing bras and tank tops are designed to allow access to your  breasts for breastfeeding without taking off your entire bra or top. Avoid wearing underwire-style bras or extremely tight bras.  Air dry your nipples for 3-9mnutes after each feeding.  Use only cotton bra pads to absorb leaked breast milk. Leaking of breast milk between feedings  is normal.  Use lanolin on your nipples after breastfeeding. Lanolin helps to maintain your skin's normal moisture barrier. If you use pure lanolin, you do not need to wash it off before feeding your baby again. Pure lanolin is not toxic to your baby. You may also hand express a few drops of breast milk and gently massage that milk into your nipples and allow the milk to air dry. In the first few weeks after giving birth, some women experience extremely full breasts (engorgement). Engorgement can make your breasts feel heavy, warm, and tender to the touch. Engorgement peaks within 3-5 days after you give birth. The following recommendations can help ease engorgement:  Completely empty your breasts while breastfeeding or pumping. You may want to start by applying warm, moist heat (in the shower or with warm water-soaked hand towels) just before feeding or pumping. This increases circulation and helps the milk flow. If your baby does not completely empty your breasts while breastfeeding, pump any extra milk after he or she is finished.  Wear a snug bra (nursing or regular) or tank top for 1-2 days to signal your body to slightly decrease milk production.  Apply ice packs to your breasts, unless this is too uncomfortable for you.  Make sure that your baby is latched on and positioned properly while breastfeeding. If engorgement persists after 48 hours of following these recommendations, contact your health care provider or a lScience writer OVERALL HEALTH CARE RECOMMENDATIONS WHILE BREASTFEEDING  Eat healthy foods. Alternate between meals and snacks, eating 3 of each per day. Because what you eat affects your breast milk, some of the foods may make your baby more irritable than usual. Avoid eating these foods if you are sure that they are negatively affecting your baby.  Drink milk, fruit juice, and water to satisfy your thirst (about 10 glasses a day).  Rest often, relax, and continue to take  your prenatal vitamins to prevent fatigue, stress, and anemia.  Continue breast self-awareness checks.  Avoid chewing and smoking tobacco. Chemicals from cigarettes that pass into breast milk and exposure to secondhand smoke may harm your baby.  Avoid alcohol and drug use, including marijuana. Some medicines that may be harmful to your baby can pass through breast milk. It is important to ask your health care provider before taking any medicine, including all over-the-counter and prescription medicine as well as vitamin and herbal supplements. It is possible to become pregnant while breastfeeding. If birth control is desired, ask your health care provider about options that will be safe for your baby. SEEK MEDICAL CARE IF:  You feel like you want to stop breastfeeding or have become frustrated with breastfeeding.  You have painful breasts or nipples.  Your nipples are cracked or bleeding.  Your breasts are red, tender, or warm.  You have a swollen area on either breast.  You have a fever or chills.  You have nausea or vomiting.  You have drainage other than breast milk from your nipples.  Your breasts do not become full before feedings by the fifth day after you give birth.  You feel sad and depressed.  Your baby is too sleepy to eat well.  Your baby is having trouble sleeping.  Your baby is wetting less than 3 diapers in a 24-hour period.  Your baby has less than 3 stools in a 24-hour period.  Your baby's skin or the white part of his or her eyes becomes yellow.   Your baby is not gaining weight by 2 days of age. SEEK IMMEDIATE MEDICAL CARE IF:  Your baby is overly tired (lethargic) and does not want to wake up and feed.  Your baby develops an unexplained fever.   This information is not intended to replace advice given to you by your health care provider. Make sure you discuss any questions you have with your health care provider.   Document Released: 11/28/2005  Document Revised: 08/19/2015 Document Reviewed: 05/22/2013 Elsevier Interactive Patient Education 2016 [employer]???  You must attend a Doren Custard class at [facility]  3rd Wednesday of every month from 7-9pm  Free  Register by calling [phone] or online at [email or web address]  Bring [place] the certificate from the class   Waterbirth supplies needed for [facility] Department patients:  Our practice has a [employer] manager in a Box tub at the hospital that you can borrow  You will need to purchase an accessory kit that has all needed supplies through [facility] [phone]) or online $175.00  Or you can purchase the supplies separately: o Single-use disposable tub liner for [employer] in a Box (REGULAR size) o New garden hose labeled "lead-free", "suitable for drinking water", o Electric drain pump to remove water (We recommend 792 gallon per hour or greater pump.)  o  "non-toxic" OR "water potable" o Garden hose to remove the dirty water o Fish net o Bathing suit top (optional) o Long-handled mirror (optional)  [employer] sells tubs for ~ $120 if you would rather purchase your own tub  [employer] (www.[email or web address]) $275 for tub rental/set-up & take down/kit    Things that would prevent you from having a waterbirth:  Premature, <37wks  Previous cesarean birth  Presence of thick meconium-stained fluid  Multiple gestation (Twins, triplets, etc.)  Uncontrolled diabetes  Hypertension  Heavy vaginal bleeding  Non-reassuring fetal heart rate  Active infection (MRSA, etc.)  If your labor has to be induced and induction method requires continuous monitoring of the baby's heart rate  Other risks/issues identified by your obstetrical provider

## 2015-11-25 NOTE — Progress Notes (Signed)
Subjective:  Danielle Esparza is a 34 y.o. G1P0000 at 264w0d being seen today for ongoing prenatal care.  She is currently monitored for the following issues for this low-risk pregnancy and has Nipple discharge; Lump or mass in breast; Cervical high risk HPV (human papillomavirus) test positive; Supervision of normal first pregnancy; Rubella non-immune status, antepartum; and Hemoglobin A-S genotype/sickle cell trait on her problem list.  Patient reports no complaints.  Contractions: Not present. Vag. Bleeding: None.  Movement: Present. Denies leaking of fluid.   The following portions of the patient's history were reviewed and updated as appropriate: allergies, current medications, past family history, past medical history, past social history, past surgical history and problem list. Problem list updated.  Objective:   Filed Vitals:   11/25/15 0846  BP: 108/70  Pulse: 80  Weight: 166 lb (75.297 kg)    Fetal Status: Fetal Heart Rate (bpm): 156 Fundal Height: 34 cm Movement: Present     General:  Alert, oriented and cooperative. Patient is in no acute distress.  Skin: Skin is warm and dry. No rash noted.   Cardiovascular: Normal heart rate noted  Respiratory: Normal respiratory effort, no problems with respiration noted  Abdomen: Soft, gravid, appropriate for gestational age. Pain/Pressure: Present     Pelvic: Vag. Bleeding: None Vag D/C Character: Thin   Cervical exam deferred        Extremities: Normal range of motion.  Edema: None  Mental Status: Normal mood and affect. Normal behavior. Normal judgment and thought content.   Urinalysis: Urine Protein: Negative Urine Glucose: Negative  Assessment and Plan:  Pregnancy: G1P0000 at 974w0d  1. Encounter for supervision of normal first pregnancy in third trimester Continue routine prenatal care.  2. Cold sore Doing well - valACYclovir (VALTREX) 1000 MG tablet; Take 1 tablet (1,000 mg total) by mouth daily.  Dispense: 5 tablet; Refill:  2  Preterm labor symptoms and general obstetric precautions including but not limited to vaginal bleeding, contractions, leaking of fluid and fetal movement were reviewed in detail with the patient. Please refer to After Visit Summary for other counseling recommendations.  Return in 2 weeks (on 12/09/2015).   Reva Boresanya S Kristinia Leavy, MD

## 2015-12-09 ENCOUNTER — Encounter: Payer: Self-pay | Admitting: Obstetrics & Gynecology

## 2015-12-09 ENCOUNTER — Ambulatory Visit (INDEPENDENT_AMBULATORY_CARE_PROVIDER_SITE_OTHER): Payer: BC Managed Care – PPO | Admitting: Obstetrics & Gynecology

## 2015-12-09 VITALS — BP 103/69 | HR 93 | Wt 168.0 lb

## 2015-12-09 DIAGNOSIS — Z36 Encounter for antenatal screening of mother: Secondary | ICD-10-CM | POA: Diagnosis not present

## 2015-12-09 DIAGNOSIS — Z3403 Encounter for supervision of normal first pregnancy, third trimester: Secondary | ICD-10-CM

## 2015-12-09 DIAGNOSIS — Z113 Encounter for screening for infections with a predominantly sexual mode of transmission: Secondary | ICD-10-CM

## 2015-12-09 LAB — OB RESULTS CONSOLE GBS: GBS: NEGATIVE

## 2015-12-09 NOTE — Progress Notes (Signed)
Subjective:  Danielle Esparza is a 34 y.o. M AA G1P0000 at 5010w0d being seen today for ongoing prenatal care.  She is currently monitored for the following issues for this low-risk pregnancy and has Nipple discharge; Lump or mass in breast; Cervical high risk HPV (human papillomavirus) test positive; Supervision of normal first pregnancy; Rubella non-immune status, antepartum; and Hemoglobin A-S genotype/sickle cell trait on her problem list.  Patient reports no complaints.  Contractions: Not present. Vag. Bleeding: None.  Movement: Present. Denies leaking of fluid.   The following portions of the patient's history were reviewed and updated as appropriate: allergies, current medications, past family history, past medical history, past social history, past surgical history and problem list. Problem list updated.  Objective:   Filed Vitals:   12/09/15 0828  BP: 103/69  Pulse: 93  Weight: 168 lb (76.204 kg)    Fetal Status: Fetal Heart Rate (bpm): 155   Movement: Present     General:  Alert, oriented and cooperative. Patient is in no acute distress.  Skin: Skin is warm and dry. No rash noted.   Cardiovascular: Normal heart rate noted  Respiratory: Normal respiratory effort, no problems with respiration noted  Abdomen: Soft, gravid, appropriate for gestational age. Pain/Pressure: Present     Pelvic: Vag. Bleeding: None Vag D/C Character: Thin   Cervical exam performed        Extremities: Normal range of motion.  Edema: None  Mental Status: Normal mood and affect. Normal behavior. Normal judgment and thought content.   Urinalysis: Urine Protein: Negative Urine Glucose: Negative  Assessment and Plan:  Pregnancy: G1P0000 at 1610w0d  1. Encounter for supervision of normal first pregnancy in third trimester  - Culture, beta strep (group b only) - GC/Chlamydia Probe Amp  Term labor symptoms and general obstetric precautions including but not limited to vaginal bleeding, contractions, leaking  of fluid and fetal movement were reviewed in detail with the patient. Please refer to After Visit Summary for other counseling recommendations.  No Follow-up on file.   Allie BossierMyra C Sheriff Rodenberg, MD

## 2015-12-11 ENCOUNTER — Ambulatory Visit (INDEPENDENT_AMBULATORY_CARE_PROVIDER_SITE_OTHER): Payer: BC Managed Care – PPO | Admitting: Physician Assistant

## 2015-12-11 VITALS — BP 106/62 | HR 94 | Temp 99.2°F | Resp 18 | Wt 175.0 lb

## 2015-12-11 DIAGNOSIS — K649 Unspecified hemorrhoids: Secondary | ICD-10-CM | POA: Diagnosis not present

## 2015-12-11 LAB — CULTURE, BETA STREP (GROUP B ONLY)

## 2015-12-11 MED ORDER — HYDROCORTISONE ACETATE 25 MG RE SUPP
25.0000 mg | Freq: Two times a day (BID) | RECTAL | Status: DC
Start: 2015-12-11 — End: 2016-01-20

## 2015-12-11 NOTE — Patient Instructions (Signed)
Hemorrhoids Hemorrhoids are swollen veins around the rectum or anus. There are two types of hemorrhoids:   Internal hemorrhoids. These occur in the veins just inside the rectum. They may poke through to the outside and become irritated and painful.  External hemorrhoids. These occur in the veins outside the anus and can be felt as a painful swelling or hard lump near the anus. CAUSES  Pregnancy.   Obesity.   Constipation or diarrhea.   Straining to have a bowel movement.   Sitting for long periods on the toilet.  Heavy lifting or other activity that caused you to strain.  Anal intercourse. SYMPTOMS   Pain.   Anal itching or irritation.   Rectal bleeding.   Fecal leakage.   Anal swelling.   One or more lumps around the anus.  DIAGNOSIS  Your caregiver may be able to diagnose hemorrhoids by visual examination. Other examinations or tests that may be performed include:   Examination of the rectal area with a gloved hand (digital rectal exam).   Examination of anal canal using a small tube (scope).   A blood test if you have lost a significant amount of blood.  A test to look inside the colon (sigmoidoscopy or colonoscopy). TREATMENT Most hemorrhoids can be treated at home. However, if symptoms do not seem to be getting better or if you have a lot of rectal bleeding, your caregiver may perform a procedure to help make the hemorrhoids get smaller or remove them completely. Possible treatments include:   Placing a rubber band at the base of the hemorrhoid to cut off the circulation (rubber band ligation).   Injecting a chemical to shrink the hemorrhoid (sclerotherapy).   Using a tool to burn the hemorrhoid (infrared light therapy).   Surgically removing the hemorrhoid (hemorrhoidectomy).   Stapling the hemorrhoid to block blood flow to the tissue (hemorrhoid stapling).  HOME CARE INSTRUCTIONS   Eat foods with fiber, such as whole grains, beans,  nuts, fruits, and vegetables. Ask your doctor about taking products with added fiber in them (fibersupplements).  Increase fluid intake. Drink enough water and fluids to keep your urine clear or pale yellow.   Exercise regularly.   Go to the bathroom when you have the urge to have a bowel movement. Do not wait.   Avoid straining to have bowel movements.   Keep the anal area dry and clean. Use wet toilet paper or moist towelettes after a bowel movement.   Medicated creams and suppositories may be used or applied as directed.   Only take over-the-counter or prescription medicines as directed by your caregiver.   Take warm sitz baths for 15-20 minutes, 3-4 times a day to ease pain and discomfort.   Place ice packs on the hemorrhoids if they are tender and swollen. Using ice packs between sitz baths may be helpful.   Put ice in a plastic bag.   Place a towel between your skin and the bag.   Leave the ice on for 15-20 minutes, 3-4 times a day.   Do not use a donut-shaped pillow or sit on the toilet for long periods. This increases blood pooling and pain.  SEEK MEDICAL CARE IF:  You have increasing pain and swelling that is not controlled by treatment or medicine.  You have uncontrolled bleeding.  You have difficulty or you are unable to have a bowel movement.  You have pain or inflammation outside the area of the hemorrhoids. MAKE SURE YOU:  Understand these instructions.    Will watch your condition.  Will get help right away if you are not doing well or get worse.   This information is not intended to replace advice given to you by your health care provider. Make sure you discuss any questions you have with your health care provider.   Document Released: 11/25/2000 Document Revised: 11/14/2012 Document Reviewed: 10/02/2012 Elsevier Interactive Patient Education 2016 Elsevier Inc. Hydrocortisone suppositories What is this medicine? HYDROCORTISONE (hye droe  KOR ti sone) is a corticosteroid. It is used to decrease swelling, itching, and pain that is caused by minor skin irritations or by hemorrhoids. This medicine may be used for other purposes; ask your health care provider or pharmacist if you have questions. What should I tell my health care provider before I take this medicine? They need to know if you have any of these conditions: -an unusual or allergic reaction to hydrocortisone, corticosteroids, other medicines, foods, dyes, or preservatives -pregnant or trying to get pregnant -breast-feeding How should I use this medicine? This medicine is for rectal use only. Do not take by mouth. Wash your hands before and after use. Take off the foil wrapping. Wet the tip of the suppository with cold tap water to make it easier to use. Lie on your side with your lower leg straightened out and your upper leg bent forward toward your stomach. Lift upper buttock to expose the rectal area. Apply gentle pressure to insert the suppository completely into the rectum, pointed end first. Hold buttocks together for a few seconds. Remain lying down for about 15 minutes to avoid having the suppository come out. Do not use more often than directed. Talk to your pediatrician regarding the use of this medicine in children. Special care may be needed. Overdosage: If you think you have taken too much of this medicine contact a poison control center or emergency room at once. NOTE: This medicine is only for you. Do not share this medicine with others. What if I miss a dose? If you miss a dose, use it as soon as you can. If it is almost time for your next dose, use only that dose. Do not use double or extra doses. What may interact with this medicine? Interactions are not expected. Do not use any other rectal products on the affected area without telling your doctor or health care professional. This list may not describe all possible interactions. Give your health care provider  a list of all the medicines, herbs, non-prescription drugs, or dietary supplements you use. Also tell them if you smoke, drink alcohol, or use illegal drugs. Some items may interact with your medicine. What should I watch for while using this medicine? Visit your doctor or health care professional for regular checks on your progress. Tell your doctor or health care professional if your symptoms do not improve after a few days of use. Do not use if there is blood in your stools. If you get any type of infection while using this medicine, you may need to stop using this medicine until our infections clears up. Ask your doctor or health care professional for advice. What side effects may I notice from receiving this medicine? Side effects that you should report to your doctor or health care professional as soon as possible: -bloody or black, tarry stools -painful, red, pus filled blisters in hair follicles -rectal pain, burning or bleeding after use of medicine Side effects that usually do not require medical attention (report to your doctor or health care professional if  they continue or are bothersome): -changes in skin color -dry skin -itching or irritation This list may not describe all possible side effects. Call your doctor for medical advice about side effects. You may report side effects to FDA at 1-800-FDA-1088. Where should I keep my medicine? Keep out of the reach of children. Store at room temperature between 20 and 25 degrees C (68 and 77 degrees F). Protect from heat and freezing. Throw away any unused medicine after the expiration date. NOTE: This sheet is a summary. It may not cover all possible information. If you have questions about this medicine, talk to your doctor, pharmacist, or health care provider.    2016, Elsevier/Gold Standard. (2008-04-11 16:07:24) How to Take a Sitz Bath A sitz bath is a warm water bath that is taken while you are sitting down. The water should only  come up to your hips and should cover your buttocks. Your health care provider may recommend a sitz bath to help you:   Clean the lower part of your body, including your genital area.  With itching.  With pain.  With sore muscles or muscles that tighten or spasm. HOW TO TAKE A SITZ BATH Take 3-4 sitz baths per day or as told by your health care provider.  Partially fill a bathtub with warm water. You will only need the water to be deep enough to cover your hips and buttocks when you are sitting in it.  If your health care provider told you to put medicine in the water, follow the directions exactly.  Sit in the water and open the tub drain a little.  Turn on the warm water again to keep the tub at the correct level. Keep the water running constantly.  Soak in the water for 15-20 minutes or as told by your health care provider.  After the sitz bath, pat the affected area dry first. Do not rub it.  Be careful when you stand up after the sitz bath because you may feel dizzy. SEEK MEDICAL CARE IF:  Your symptoms get worse. Do not continue with sitz baths if your symptoms get worse.  You have new symptoms. Do not continue with sitz baths until you talk with your health care provider.   This information is not intended to replace advice given to you by your health care provider. Make sure you discuss any questions you have with your health care provider.   Document Released: 08/20/2004 Document Revised: 04/14/2015 Document Reviewed: 11/26/2014 Elsevier Interactive Patient Education [employer]2016 Elsevier Inc.

## 2015-12-11 NOTE — Progress Notes (Signed)
Patient ID: Danielle MaidensAjalyn D Esparza, female   DOB: [birth year], 34 y.o.   MRN: [id number]       Patient: Danielle Esparza Female    DOB: [birth year]   34 y.o.   MRN: [id number] Visit Date: 12/11/2015  Today's Provider: Margaretann LovelessJennifer M Ailee Pates, PA-C   Chief Complaint  Patient presents with  . Hemorrhoids   Subjective:    HPI  Patient is here to discuss hemorrhoids. She saw her gyn on Tuesday December 27th for pregnancy follow up and they did vaginal culture and the doctor told patient that she has internal hemorrhoids at that time. She was not provided treatment. Then yesterday she saw hemorrhoid on the outside, she has had some burning sensation but when she urinates in rectal area. No pain, no burning. She had a bowel movement about 1 hour ago and saw blood with the bowel movement, "she states blood was not in the stool but came out separately."    No Known Allergies Previous Medications   MISC. DEVICES (BREAST PUMP) MISC    Use as directed.   PRENATAL VITAMIN W/FE, FA (NATACHEW) 29-1 MG CHEW CHEWABLE TABLET    Chew 1 tablet by mouth daily at 12 noon.   VALACYCLOVIR (VALTREX) 1000 MG TABLET    Take 1 tablet (1,000 mg total) by mouth daily.    Review of Systems  Constitutional: Negative.   Respiratory: Negative.   Cardiovascular: Negative.   Gastrointestinal: Positive for constipation (currently on miralax per Dr. Marice Potterove) and blood in stool. Negative for nausea, vomiting, abdominal pain, diarrhea, anal bleeding and rectal pain.    Social History  Substance Use Topics  . Smoking status: Never Smoker   . Smokeless tobacco: Never Used  . Alcohol Use: No   Objective:   BP 106/62 mmHg  Pulse 94  Temp(Src) 99.2 F (37.3 C)  Resp 18  Wt 175 lb (79.379 kg)  LMP 04/01/2015 (Exact Date)  Physical Exam  Constitutional: She appears well-developed and well-nourished. No distress.  Cardiovascular: Normal rate, regular rhythm and normal heart sounds.  Exam reveals no gallop and no friction rub.     No murmur heard. Pulmonary/Chest: Effort normal and breath sounds normal. No respiratory distress. She has no wheezes. She has no rales.  Genitourinary: Rectal exam shows internal hemorrhoid (prolapsed and with mild bloody discharge. I was able to reduce prolapsed hemorrhoid). Rectal exam shows no external hemorrhoid, no fissure, no mass, no tenderness and anal tone normal. Guaiac positive stool.  Skin: She is not diaphoretic.  Vitals reviewed.       Assessment & Plan:     1. Hemorrhoids, unspecified hemorrhoid type She is already currently on MiraLAX for constipation per her OB. I will add Anusol as below. I also discussed and explained to her how to do a sitz bath. She may do this once or twice daily as needed.I advised her to make sure to be drinking plenty of fluid as well as continuing MiraLAX as directed.  She is to call the office if conservative treatment does not reduce symptoms. - hydrocortisone (ANUSOL-HC) 25 MG suppository; Place 1 suppository (25 mg total) rectally 2 (two) times daily.  Dispense: 12 suppository; Refill: 0       Margaretann LovelessJennifer M Zoiee Wimmer, PA-C  [facility]

## 2015-12-12 LAB — URINE CYTOLOGY ANCILLARY ONLY
CHLAMYDIA, DNA PROBE: NEGATIVE
NEISSERIA GONORRHEA: NEGATIVE

## 2015-12-13 NOTE — L&D Delivery Note (Signed)
Patient is 35 y.o. G1P0000 [redacted]w[redacted]d admitted for SROM, hx of rubella non-immune.    Delivery Note At 3:08 PM a viable female was delivered via Vaginal, Spontaneous Delivery (Presentation: Left Occiput Anterior).  APGARS: baby vigorous, good tone and cry - refer to delivery summary ; weight pending .   Placenta status: intact.  Cord:  with the following complications: Short.    Anesthesia: Epidural  Episiotomy:  none Lacerations:  1st degree L sulcus Suture Repair: 3.0 vicryl Est. Blood Loss (mL):  150  Mom to postpartum.  Baby to Couplet care / Skin to Skin.  Gilliam Hawkes 01/02/2016, 3:40 PM     Patient developed Fever 101F at time of delivery - abx not given due to timing.   Upon arrival patient was complete and pushing. She pushed with good maternal effort to deliver a healthy baby girl. Baby delivered without difficulty, was noted to have good tone and place on maternal abdomen for oral suctioning, drying and stimulation. Delayed cord clamping performed for 3 minutes. Placenta delivered intact with 3V cord. Vaginal canal and perineum was inspected and 1st degree L sulcal tear repaired in standard fashion; hemostatic. Pitocin was started and uterus massaged until bleeding slowed. Counts of sharps, instruments, and lap pads were all correct.   Wynne Dust, MD, PGY-1

## 2015-12-14 ENCOUNTER — Encounter: Payer: Self-pay | Admitting: *Deleted

## 2015-12-15 ENCOUNTER — Encounter: Payer: Self-pay | Admitting: Physician Assistant

## 2015-12-17 ENCOUNTER — Ambulatory Visit (INDEPENDENT_AMBULATORY_CARE_PROVIDER_SITE_OTHER): Payer: BC Managed Care – PPO | Admitting: Obstetrics and Gynecology

## 2015-12-17 ENCOUNTER — Encounter: Payer: Self-pay | Admitting: Obstetrics and Gynecology

## 2015-12-17 VITALS — BP 108/74 | HR 112 | Wt 171.0 lb

## 2015-12-17 DIAGNOSIS — Z283 Underimmunization status: Secondary | ICD-10-CM

## 2015-12-17 DIAGNOSIS — Z2839 Other underimmunization status: Secondary | ICD-10-CM

## 2015-12-17 DIAGNOSIS — D573 Sickle-cell trait: Secondary | ICD-10-CM

## 2015-12-17 DIAGNOSIS — O9989 Other specified diseases and conditions complicating pregnancy, childbirth and the puerperium: Secondary | ICD-10-CM

## 2015-12-17 DIAGNOSIS — Z3403 Encounter for supervision of normal first pregnancy, third trimester: Secondary | ICD-10-CM

## 2015-12-17 NOTE — Progress Notes (Signed)
Subjective:  Danielle Esparza is a 35 y.o. G1P0000 at 2779w1d being seen today for ongoing prenatal care.  She is currently monitored for the following issues for this low-risk pregnancy and has Nipple discharge; Lump or mass in breast; Cervical high risk HPV (human papillomavirus) test positive; Supervision of normal first pregnancy; Rubella non-immune status, antepartum; and Hemoglobin A-S genotype/sickle cell trait on her problem list.  Patient reports no complaints.  Contractions: Not present. Vag. Bleeding: None.  Movement: Present. Denies leaking of fluid.   The following portions of the patient's history were reviewed and updated as appropriate: allergies, current medications, past family history, past medical history, past social history, past surgical history and problem list. Problem list updated.  Objective:   Filed Vitals:   12/17/15 0902  BP: 108/74  Pulse: 112  Weight: 171 lb (77.565 kg)    Fetal Status: Fetal Heart Rate (bpm): 143 Fundal Height: 37 cm Movement: Present  Presentation: Vertex  General:  Alert, oriented and cooperative. Patient is in no acute distress.  Skin: Skin is warm and dry. No rash noted.   Cardiovascular: Normal heart rate noted  Respiratory: Normal respiratory effort, no problems with respiration noted  Abdomen: Soft, gravid, appropriate for gestational age. Pain/Pressure: Absent     Pelvic: Vag. Bleeding: None Vag D/C Character: Thin   Cervical exam performed Dilation: Fingertip Effacement (%): 30 Station: Ballotable0.5/30/high  Extremities: Normal range of motion.  Edema: None  Mental Status: Normal mood and affect. Normal behavior. Normal judgment and thought content.   Urinalysis: Urine Protein: Negative Urine Glucose: Negative  Assessment and Plan:  Pregnancy: G1P0000 at 6479w1d  1. Encounter for supervision of normal first pregnancy in third trimester Patient is doing well  2. Hemoglobin A-S genotype/sickle cell trait   3. Rubella  non-immune status, antepartum Will need postpartum vaccination  Term labor symptoms and general obstetric precautions including but not limited to vaginal bleeding, contractions, leaking of fluid and fetal movement were reviewed in detail with the patient. Please refer to After Visit Summary for other counseling recommendations.  Return in about 1 week (around 12/24/2015).   Catalina AntiguaPeggy Ifeanyi Mickelson, MD

## 2015-12-22 ENCOUNTER — Ambulatory Visit (INDEPENDENT_AMBULATORY_CARE_PROVIDER_SITE_OTHER): Payer: BC Managed Care – PPO | Admitting: Family Medicine

## 2015-12-22 VITALS — BP 106/70 | HR 80 | Wt 176.0 lb

## 2015-12-22 DIAGNOSIS — N898 Other specified noninflammatory disorders of vagina: Secondary | ICD-10-CM

## 2015-12-22 DIAGNOSIS — Z3403 Encounter for supervision of normal first pregnancy, third trimester: Secondary | ICD-10-CM

## 2015-12-22 MED ORDER — TERCONAZOLE 0.4 % VA CREA: 1.0000 | TOPICAL_CREAM | Freq: Every day | VAGINAL | Status: DC

## 2015-12-22 NOTE — Patient Instructions (Signed)
Third Trimester of Pregnancy The third trimester is from week 29 through week 42, months 7 through 9. The third trimester is a time when the fetus is growing rapidly. At the end of the ninth month, the fetus is about 20 inches in length and weighs 6-10 pounds.  BODY CHANGES Your body goes through many changes during pregnancy. The changes vary from woman to woman.   Your weight will continue to increase. You can expect to gain 25-35 pounds (11-16 kg) by the end of the pregnancy.  You may begin to get stretch marks on your hips, abdomen, and breasts.  You may urinate more often because the fetus is moving lower into your pelvis and pressing on your bladder.  You may develop or continue to have heartburn as a result of your pregnancy.  You may develop constipation because certain hormones are causing the muscles that push waste through your intestines to slow down.  You may develop hemorrhoids or swollen, bulging veins (varicose veins).  You may have pelvic pain because of the weight gain and pregnancy hormones relaxing your joints between the bones in your pelvis. Backaches may result from overexertion of the muscles supporting your posture.  You may have changes in your hair. These can include thickening of your hair, rapid growth, and changes in texture. Some women also have hair loss during or after pregnancy, or hair that feels dry or thin. Your hair will most likely return to normal after your baby is born.  Your breasts will continue to grow and be tender. A yellow discharge may leak from your breasts called colostrum.  Your belly button may stick out.  You may feel short of breath because of your expanding uterus.  You may notice the fetus "dropping," or moving lower in your abdomen.  You may have a bloody mucus discharge. This usually occurs a few days to a week before labor begins.  Your cervix becomes thin and soft (effaced) near your due date. WHAT TO EXPECT AT YOUR  PRENATAL EXAMS  You will have prenatal exams every 2 weeks until week 36. Then, you will have weekly prenatal exams. During a routine prenatal visit:  You will be weighed to make sure you and the fetus are growing normally.  Your blood pressure is taken.  Your abdomen will be measured to track your baby's growth.  The fetal heartbeat will be listened to.  Any test results from the previous visit will be discussed.  You may have a cervical check near your due date to see if you have effaced. At around 36 weeks, your caregiver will check your cervix. At the same time, your caregiver will also perform a test on the secretions of the vaginal tissue. This test is to determine if a type of bacteria, Group B streptococcus, is present. Your caregiver will explain this further. Your caregiver may ask you:  What your birth plan is.  How you are feeling.  If you are feeling the baby move.  If you have had any abnormal symptoms, such as leaking fluid, bleeding, severe headaches, or abdominal cramping.  If you are using any tobacco products, including cigarettes, chewing tobacco, and electronic cigarettes.  If you have any questions. Other tests or screenings that may be performed during your third trimester include:  Blood tests that check for low iron levels (anemia).  Fetal testing to check the health, activity level, and growth of the fetus. Testing is done if you have certain medical conditions or if   there are problems during the pregnancy.  HIV (human immunodeficiency virus) testing. If you are at high risk, you may be screened for HIV during your third trimester of pregnancy. FALSE LABOR You may feel small, irregular contractions that eventually go away. These are called Braxton Hicks contractions, or false labor. Contractions may last for hours, days, or even weeks before true labor sets in. If contractions come at regular intervals, intensify, or become painful, it is best to be seen  by your caregiver.  SIGNS OF LABOR   Menstrual-like cramps.  Contractions that are 5 minutes apart or less.  Contractions that start on the top of the uterus and spread down to the lower abdomen and back.  A sense of increased pelvic pressure or back pain.  A watery or bloody mucus discharge that comes from the vagina. If you have any of these signs before the 37th week of pregnancy, call your caregiver right away. You need to go to the hospital to get checked immediately. HOME CARE INSTRUCTIONS   Avoid all smoking, herbs, alcohol, and unprescribed drugs. These chemicals affect the formation and growth of the baby.  Do not use any tobacco products, including cigarettes, chewing tobacco, and electronic cigarettes. If you need help quitting, ask your health care provider. You may receive counseling support and other resources to help you quit.  Follow your caregiver's instructions regarding medicine use. There are medicines that are either safe or unsafe to take during pregnancy.  Exercise only as directed by your caregiver. Experiencing uterine cramps is a good sign to stop exercising.  Continue to eat regular, healthy meals.  Wear a good support bra for breast tenderness.  Do not use hot tubs, steam rooms, or saunas.  Wear your seat belt at all times when driving.  Avoid raw meat, uncooked cheese, cat litter boxes, and soil used by cats. These carry germs that can cause birth defects in the baby.  Take your prenatal vitamins.  Take 1500-2000 mg of calcium daily starting at the 20th week of pregnancy until you deliver your baby.  Try taking a stool softener (if your caregiver approves) if you develop constipation. Eat more high-fiber foods, such as fresh vegetables or fruit and whole grains. Drink plenty of fluids to keep your urine clear or pale yellow.  Take warm sitz baths to soothe any pain or discomfort caused by hemorrhoids. Use hemorrhoid cream if your caregiver  approves.  If you develop varicose veins, wear support hose. Elevate your feet for 15 minutes, 3-4 times a day. Limit salt in your diet.  Avoid heavy lifting, wear low heal shoes, and practice good posture.  Rest a lot with your legs elevated if you have leg cramps or low back pain.  Visit your dentist if you have not gone during your pregnancy. Use a soft toothbrush to brush your teeth and be gentle when you floss.  A sexual relationship may be continued unless your caregiver directs you otherwise.  Do not travel far distances unless it is absolutely necessary and only with the approval of your caregiver.  Take prenatal classes to understand, practice, and ask questions about the labor and delivery.  Make a trial run to the hospital.  Pack your hospital bag.  Prepare the baby's nursery.  Continue to go to all your prenatal visits as directed by your caregiver. SEEK MEDICAL CARE IF:  You are unsure if you are in labor or if your water has broken.  You have dizziness.  You have   mild pelvic cramps, pelvic pressure, or nagging pain in your abdominal area.  You have persistent nausea, vomiting, or diarrhea.  You have a bad smelling vaginal discharge.  You have pain with urination. SEEK IMMEDIATE MEDICAL CARE IF:   You have a fever.  You are leaking fluid from your vagina.  You have spotting or bleeding from your vagina.  You have severe abdominal cramping or pain.  You have rapid weight loss or gain.  You have shortness of breath with chest pain.  You notice sudden or extreme swelling of your face, hands, ankles, feet, or legs.  You have not felt your baby move in over an hour.  You have severe headaches that do not go away with medicine.  You have vision changes.   This information is not intended to replace advice given to you by your health care provider. Make sure you discuss any questions you have with your health care provider.   Document Released:  11/22/2001 Document Revised: 12/19/2014 Document Reviewed: 01/29/2013 Elsevier Interactive Patient Education 2016 [employer]  Breastfeeding Deciding to breastfeed is one of the best choices you can make for you and your baby. A change in hormones during pregnancy causes your breast tissue to grow and increases the number and size of your milk ducts. These hormones also allow proteins, sugars, and fats from your blood supply to make breast milk in your milk-producing glands. Hormones prevent breast milk from being released before your baby is born as well as prompt milk flow after birth. Once breastfeeding has begun, thoughts of your baby, as well as his or her sucking or crying, can stimulate the release of milk from your milk-producing glands.  BENEFITS OF BREASTFEEDING For Your Baby  Your first milk (colostrum) helps your baby's digestive system function better.  There are antibodies in your milk that help your baby fight off infections.  Your baby has a lower incidence of asthma, allergies, and sudden infant death syndrome.  The nutrients in breast milk are better for your baby than infant formulas and are designed uniquely for your baby's needs.  Breast milk improves your baby's brain development.  Your baby is less likely to develop other conditions, such as childhood obesity, asthma, or type 2 diabetes mellitus. For You  Breastfeeding helps to create a very special bond between you and your baby.  Breastfeeding is convenient. Breast milk is always available at the correct temperature and costs nothing.  Breastfeeding helps to burn calories and helps you lose the weight gained during pregnancy.  Breastfeeding makes your uterus contract to its prepregnancy size faster and slows bleeding (lochia) after you give birth.   Breastfeeding helps to lower your risk of developing type 2 diabetes mellitus, osteoporosis, and breast or ovarian cancer later in life. SIGNS THAT YOUR BABY IS  HUNGRY Early Signs of Hunger  Increased alertness or activity.  Stretching.  Movement of the head from side to side.  Movement of the head and opening of the mouth when the corner of the mouth or cheek is stroked (rooting).  Increased sucking sounds, smacking lips, cooing, sighing, or squeaking.  Hand-to-mouth movements.  Increased sucking of fingers or hands. Late Signs of Hunger  Fussing.  Intermittent crying. Extreme Signs of Hunger Signs of extreme hunger will require calming and consoling before your baby will be able to breastfeed successfully. Do not wait for the following signs of extreme hunger to occur before you initiate breastfeeding:  Restlessness.  A loud, strong cry.  Screaming.   BREASTFEEDING BASICS Breastfeeding Initiation  Find a comfortable place to sit or lie down, with your neck and back well supported.  Place a pillow or rolled up blanket under your baby to bring him or her to the level of your breast (if you are seated). Nursing pillows are specially designed to help support your arms and your baby while you breastfeed.  Make sure that your baby's abdomen is facing your abdomen.  Gently massage your breast. With your fingertips, massage from your chest wall toward your nipple in a circular motion. This encourages milk flow. You may need to continue this action during the feeding if your milk flows slowly.  Support your breast with 4 fingers underneath and your thumb above your nipple. Make sure your fingers are well away from your nipple and your baby's mouth.  Stroke your baby's lips gently with your finger or nipple.  When your baby's mouth is open wide enough, quickly bring your baby to your breast, placing your entire nipple and as much of the colored area around your nipple (areola) as possible into your baby's mouth.  More areola should be visible above your baby's upper lip than below the lower lip.  Your baby's tongue should be between his  or her lower gum and your breast.  Ensure that your baby's mouth is correctly positioned around your nipple (latched). Your baby's lips should create a seal on your breast and be turned out (everted).  It is common for your baby to suck about 2-3 minutes in order to start the flow of breast milk. Latching Teaching your baby how to latch on to your breast properly is very important. An improper latch can cause nipple pain and decreased milk supply for you and poor weight gain in your baby. Also, if your baby is not latched onto your nipple properly, he or she may swallow some air during feeding. This can make your baby fussy. Burping your baby when you switch breasts during the feeding can help to get rid of the air. However, teaching your baby to latch on properly is still the best way to prevent fussiness from swallowing air while breastfeeding. Signs that your baby has successfully latched on to your nipple:  Silent tugging or silent sucking, without causing you pain.  Swallowing heard between every 3-4 sucks.  Muscle movement above and in front of his or her ears while sucking. Signs that your baby has not successfully latched on to nipple:  Sucking sounds or smacking sounds from your baby while breastfeeding.  Nipple pain. If you think your baby has not latched on correctly, slip your finger into the corner of your baby's mouth to break the suction and place it between your baby's gums. Attempt breastfeeding initiation again. Signs of Successful Breastfeeding Signs from your baby:  A gradual decrease in the number of sucks or complete cessation of sucking.  Falling asleep.  Relaxation of his or her body.  Retention of a small amount of milk in his or her mouth.  Letting go of your breast by himself or herself. Signs from you:  Breasts that have increased in firmness, weight, and size 1-3 hours after feeding.  Breasts that are softer immediately after  breastfeeding.  Increased milk volume, as well as a change in milk consistency and color by the fifth day of breastfeeding.  Nipples that are not sore, cracked, or bleeding. Signs That Your Baby is Getting Enough Milk  Wetting at least 3 diapers in a 24-hour period.   The urine should be clear and pale yellow by age 5 days.  At least 3 stools in a 24-hour period by age 5 days. The stool should be soft and yellow.  At least 3 stools in a 24-hour period by age 7 days. The stool should be seedy and yellow.  No loss of weight greater than 10% of birth weight during the first 3 days of age.  Average weight gain of 4-7 ounces (113-198 g) per week after age 4 days.  Consistent daily weight gain by age 5 days, without weight loss after the age of 2 weeks. After a feeding, your baby may spit up a small amount. This is common. BREASTFEEDING FREQUENCY AND DURATION Frequent feeding will help you make more milk and can prevent sore nipples and breast engorgement. Breastfeed when you feel the need to reduce the fullness of your breasts or when your baby shows signs of hunger. This is called "breastfeeding on demand." Avoid introducing a pacifier to your baby while you are working to establish breastfeeding (the first 4-6 weeks after your baby is born). After this time you may choose to use a pacifier. Research has shown that pacifier use during the first year of a baby's life decreases the risk of sudden infant death syndrome (SIDS). Allow your baby to feed on each breast as long as he or she wants. Breastfeed until your baby is finished feeding. When your baby unlatches or falls asleep while feeding from the first breast, offer the second breast. Because newborns are often sleepy in the first few weeks of life, you may need to awaken your baby to get him or her to feed. Breastfeeding times will vary from baby to baby. However, the following rules can serve as a guide to help you ensure that your baby is  properly fed:  Newborns (babies 4 weeks of age or younger) may breastfeed every 1-3 hours.  Newborns should not go longer than 3 hours during the day or 5 hours during the night without breastfeeding.  You should breastfeed your baby a minimum of 8 times in a 24-hour period until you begin to introduce solid foods to your baby at around 6 months of age. BREAST MILK PUMPING Pumping and storing breast milk allows you to ensure that your baby is exclusively fed your breast milk, even at times when you are unable to breastfeed. This is especially important if you are going back to work while you are still breastfeeding or when you are not able to be present during feedings. Your lactation consultant can give you guidelines on how long it is safe to store breast milk. A breast pump is a machine that allows you to pump milk from your breast into a sterile bottle. The pumped breast milk can then be stored in a refrigerator or freezer. Some breast pumps are operated by hand, while others use electricity. Ask your lactation consultant which type will work best for you. Breast pumps can be purchased, but some hospitals and breastfeeding support groups lease breast pumps on a monthly basis. A lactation consultant can teach you how to hand express breast milk, if you prefer not to use a pump. CARING FOR YOUR BREASTS WHILE YOU BREASTFEED Nipples can become dry, cracked, and sore while breastfeeding. The following recommendations can help keep your breasts moisturized and healthy:  Avoid using soap on your nipples.  Wear a supportive bra. Although not required, special nursing bras and tank tops are designed to allow access to your   breasts for breastfeeding without taking off your entire bra or top. Avoid wearing underwire-style bras or extremely tight bras.  Air dry your nipples for 3-4minutes after each feeding.  Use only cotton bra pads to absorb leaked breast milk. Leaking of breast milk between feedings  is normal.  Use lanolin on your nipples after breastfeeding. Lanolin helps to maintain your skin's normal moisture barrier. If you use pure lanolin, you do not need to wash it off before feeding your baby again. Pure lanolin is not toxic to your baby. You may also hand express a few drops of breast milk and gently massage that milk into your nipples and allow the milk to air dry. In the first few weeks after giving birth, some women experience extremely full breasts (engorgement). Engorgement can make your breasts feel heavy, warm, and tender to the touch. Engorgement peaks within 3-5 days after you give birth. The following recommendations can help ease engorgement:  Completely empty your breasts while breastfeeding or pumping. You may want to start by applying warm, moist heat (in the shower or with warm water-soaked hand towels) just before feeding or pumping. This increases circulation and helps the milk flow. If your baby does not completely empty your breasts while breastfeeding, pump any extra milk after he or she is finished.  Wear a snug bra (nursing or regular) or tank top for 1-2 days to signal your body to slightly decrease milk production.  Apply ice packs to your breasts, unless this is too uncomfortable for you.  Make sure that your baby is latched on and positioned properly while breastfeeding. If engorgement persists after 48 hours of following these recommendations, contact your health care provider or a lactation consultant. OVERALL HEALTH CARE RECOMMENDATIONS WHILE BREASTFEEDING  Eat healthy foods. Alternate between meals and snacks, eating 3 of each per day. Because what you eat affects your breast milk, some of the foods may make your baby more irritable than usual. Avoid eating these foods if you are sure that they are negatively affecting your baby.  Drink milk, fruit juice, and water to satisfy your thirst (about 10 glasses a day).  Rest often, relax, and continue to take  your prenatal vitamins to prevent fatigue, stress, and anemia.  Continue breast self-awareness checks.  Avoid chewing and smoking tobacco. Chemicals from cigarettes that pass into breast milk and exposure to secondhand smoke may harm your baby.  Avoid alcohol and drug use, including marijuana. Some medicines that may be harmful to your baby can pass through breast milk. It is important to ask your health care provider before taking any medicine, including all over-the-counter and prescription medicine as well as vitamin and herbal supplements. It is possible to become pregnant while breastfeeding. If birth control is desired, ask your health care provider about options that will be safe for your baby. SEEK MEDICAL CARE IF:  You feel like you want to stop breastfeeding or have become frustrated with breastfeeding.  You have painful breasts or nipples.  Your nipples are cracked or bleeding.  Your breasts are red, tender, or warm.  You have a swollen area on either breast.  You have a fever or chills.  You have nausea or vomiting.  You have drainage other than breast milk from your nipples.  Your breasts do not become full before feedings by the fifth day after you give birth.  You feel sad and depressed.  Your baby is too sleepy to eat well.  Your baby is having trouble sleeping.     Your baby is wetting less than 3 diapers in a 24-hour period.  Your baby has less than 3 stools in a 24-hour period.  Your baby's skin or the white part of his or her eyes becomes yellow.   Your baby is not gaining weight by 5 days of age. SEEK IMMEDIATE MEDICAL CARE IF:  Your baby is overly tired (lethargic) and does not want to wake up and feed.  Your baby develops an unexplained fever.   This information is not intended to replace advice given to you by your health care provider. Make sure you discuss any questions you have with your health care provider.   Document Released: 11/28/2005  Document Revised: 08/19/2015 Document Reviewed: 05/22/2013 Elsevier Interactive Patient Education 2016 [employer]  

## 2015-12-22 NOTE — Progress Notes (Signed)
Subjective:  Danielle Esparza is a 35 y.o. G1P0000 at 2369w6d being seen today for ongoing prenatal care.  She is currently monitored for the following issues for this low-risk pregnancy and has Nipple discharge; Lump or mass in breast; Cervical high risk HPV (human papillomavirus) test positive; Supervision of normal first pregnancy; Rubella non-immune status, antepartum; and Hemoglobin A-S genotype/sickle cell trait on her problem list.  Patient reports no complaints.  Contractions: Irregular. Vag. Bleeding: None.  Movement: Present. Denies leaking of fluid.   The following portions of the patient's history were reviewed and updated as appropriate: allergies, current medications, past family history, past medical history, past social history, past surgical history and problem list. Problem list updated.  Objective:   Filed Vitals:   12/22/15 0854  BP: 106/70  Pulse: 80  Weight: 176 lb (79.833 kg)    Fetal Status: Fetal Heart Rate (bpm): 147 Fundal Height: 36 cm Movement: Present  Presentation: Vertex  General:  Alert, oriented and cooperative. Patient is in no acute distress.  Skin: Skin is warm and dry. No rash noted.   Cardiovascular: Normal heart rate noted  Respiratory: Normal respiratory effort, no problems with respiration noted  Abdomen: Soft, gravid, appropriate for gestational age. Pain/Pressure: Present     Pelvic: Vag. Bleeding: None Vag D/C Character: Thin   Cervical exam performed Dilation: 1 Effacement (%): 60 Station: -2  Extremities: Normal range of motion.  Edema: None  Mental Status: Normal mood and affect. Normal behavior. Normal judgment and thought content.   Urinalysis: Urine Protein: Negative Urine Glucose: Negative  Assessment and Plan:  Pregnancy: G1P0000 at 5669w6d  1. Encounter for supervision of normal first pregnancy in third trimester Continue routine prenatal care.  2. Vaginal discharge Likely yeast--will treat presumptively - terconazole (TERAZOL 7)  0.4 % vaginal cream; Place 1 applicator vaginally at bedtime.  Dispense: 45 g; Refill: 0  Term labor symptoms and general obstetric precautions including but not limited to vaginal bleeding, contractions, leaking of fluid and fetal movement were reviewed in detail with the patient. Please refer to After Visit Summary for other counseling recommendations.  Return in 1 week (on 12/29/2015).   Reva Boresanya S Ogden Handlin, MD

## 2015-12-24 ENCOUNTER — Encounter: Payer: BC Managed Care – PPO | Admitting: Obstetrics and Gynecology

## 2015-12-29 ENCOUNTER — Ambulatory Visit (INDEPENDENT_AMBULATORY_CARE_PROVIDER_SITE_OTHER): Payer: BC Managed Care – PPO | Admitting: Obstetrics & Gynecology

## 2015-12-29 VITALS — BP 120/78 | HR 108 | Wt 180.0 lb

## 2015-12-29 DIAGNOSIS — Z3403 Encounter for supervision of normal first pregnancy, third trimester: Secondary | ICD-10-CM

## 2015-12-29 NOTE — Progress Notes (Signed)
Subjective:  Danielle Esparza is a 35 y.o. G1P0000 at [redacted]w[redacted]d being seen today for ongoing prenatal care.  She is currently monitored for the following issues for this low-risk pregnancy and has Nipple discharge; Lump or mass in breast; Cervical high risk HPV (human papillomavirus) test positive; Supervision of normal first pregnancy; Rubella non-immune status, antepartum; and Hemoglobin A-S genotype/sickle cell trait on her problem list.  Patient reports no complaints.  Contractions: Irregular. Vag. Bleeding: None.  Movement: Present. Denies leaking of fluid.   The following portions of the patient's history were reviewed and updated as appropriate: allergies, current medications, past family history, past medical history, past social history, past surgical history and problem list. Problem list updated.  Objective:   Filed Vitals:   12/29/15 1453  BP: 120/78  Pulse: 108  Weight: 180 lb (81.647 kg)    Fetal Status: Fetal Heart Rate (bpm): 149 Fundal Height: 40 cm Movement: Present  Presentation: Vertex  General:  Alert, oriented and cooperative. Patient is in no acute distress.  Skin: Skin is warm and dry. No rash noted.   Cardiovascular: Normal heart rate noted  Respiratory: Normal respiratory effort, no problems with respiration noted  Abdomen: Soft, gravid, appropriate for gestational age. Pain/Pressure: Present     Pelvic: Vag. Bleeding: None Vag D/C Character: Thin   Cervical exam performed Dilation: 1 Effacement (%): 60 Station: -2  Extremities: Normal range of motion.  Edema: None  Mental Status: Normal mood and affect. Normal behavior. Normal judgment and thought content.   Urinalysis: Urine Protein: Negative Urine Glucose: Negative  Assessment and Plan:  Pregnancy: G1P0000 at [redacted]w[redacted]d  1. Encounter for supervision of normal first pregnancy in third trimester Postdates testing to start next week. Term labor symptoms and general obstetric precautions including but not limited to  vaginal bleeding, contractions, leaking of fluid and fetal movement were reviewed in detail with the patient. Please refer to After Visit Summary for other counseling recommendations.  Return in about 9 days (around 01/07/2016) for OB Visit, NST, AFI.  01/11/16 NST only.   Tereso Newcomer, MD

## 2015-12-29 NOTE — Patient Instructions (Addendum)
Return to clinic for any obstetric concerns or go to MAU for evaluation Labor Induction Labor induction is when steps are taken to cause a pregnant woman to begin the labor process. Most women go into labor on their own between 37 weeks and 42 weeks of the pregnancy. When this does not happen or when there is a medical need, methods may be used to induce labor. Labor induction causes a pregnant woman's uterus to contract. It also causes the cervix to soften (ripen), open (dilate), and thin out (efface). Usually, labor is not induced before 39 weeks of the pregnancy unless there is a problem with the baby or mother.  Before inducing labor, your health care provider will consider a number of factors, including the following:  The medical condition of you and the baby.   How many weeks along you are.   The status of the baby's lung maturity.   The condition of the cervix.   The position of the baby.  WHAT ARE THE REASONS FOR LABOR INDUCTION? Labor may be induced for the following reasons:  The health of the baby or mother is at risk.   The pregnancy is overdue by 1 week or more.   The water breaks but labor does not start on its own.   The mother has a health condition or serious illness, such as high blood pressure, infection, placental abruption, or diabetes.  The amniotic fluid amounts are low around the baby.   The baby is distressed.  Convenience or wanting the baby to be born on a certain date is not a reason for inducing labor. WHAT METHODS ARE USED FOR LABOR INDUCTION? Several methods of labor induction may be used, such as:   Prostaglandin medicine. This medicine causes the cervix to dilate and ripen. The medicine will also start contractions. It can be taken by mouth or by inserting a suppository into the vagina.   Inserting a thin tube (catheter) with a balloon on the end into the vagina to dilate the cervix. Once inserted, the balloon is expanded with water,  which causes the cervix to open.   Stripping the membranes. Your health care provider separates amniotic sac tissue from the cervix, causing the cervix to be stretched and causing the release of a hormone called progesterone. This may cause the uterus to contract. It is often done during an office visit. You will be sent home to wait for the contractions to begin. You will then come in for an induction.   Breaking the water. Your health care provider makes a hole in the amniotic sac using a small instrument. Once the amniotic sac breaks, contractions should begin. This may still take hours to see an effect.   Medicine to trigger or strengthen contractions. This medicine is given through an IV access tube inserted into a vein in your arm.  All of the methods of induction, besides stripping the membranes, will be done in the hospital. Induction is done in the hospital so that you and the baby can be carefully monitored.  HOW LONG DOES IT TAKE FOR LABOR TO BE INDUCED? Some inductions can take up to 2-3 days. Depending on the cervix, it usually takes less time. It takes longer when you are induced early in the pregnancy or if this is your first pregnancy. If a mother is still pregnant and the induction has been going on for 2-3 days, either the mother will be sent home or a cesarean delivery will be needed. WHAT ARE  THE RISKS ASSOCIATED WITH LABOR INDUCTION? Some of the risks of induction include:   Changes in fetal heart rate, such as too high, too low, or erratic.   Fetal distress.   Chance of infection for the mother and baby.   Increased chance of having a cesarean delivery.   Breaking off (abruption) of the placenta from the uterus (rare).   Uterine rupture (very rare).  When induction is needed for medical reasons, the benefits of induction may outweigh the risks. WHAT ARE SOME REASONS FOR NOT INDUCING LABOR? Labor induction should not be done if:   It is shown that your baby  does not tolerate labor.   You have had previous surgeries on your uterus, such as a myomectomy or the removal of fibroids.   Your placenta lies very low in the uterus and blocks the opening of the cervix (placenta previa).   Your baby is not in a head-down position.   The umbilical cord drops down into the birth canal in front of the baby. This could cut off the baby's blood and oxygen supply.   You have had a previous cesarean delivery.   There are unusual circumstances, such as the baby being extremely premature.    This information is not intended to replace advice given to you by your health care provider. Make sure you discuss any questions you have with your health care provider.   Document Released: 04/19/2007 Document Revised: 12/19/2014 Document Reviewed: 06/27/2013 Elsevier Interactive Patient Education [employer]2016 Elsevier Inc.

## 2015-12-31 ENCOUNTER — Inpatient Hospital Stay (EMERGENCY_DEPARTMENT_HOSPITAL)
Admission: AD | Admit: 2015-12-31 | Discharge: 2015-12-31 | Disposition: A | Discharge status: Home / Self Care | Payer: BC Managed Care – PPO | Source: Ambulatory Visit | Attending: Obstetrics & Gynecology | Admitting: Obstetrics & Gynecology

## 2015-12-31 ENCOUNTER — Encounter (HOSPITAL_COMMUNITY): Payer: Self-pay

## 2015-12-31 DIAGNOSIS — O471 False labor at or after 37 completed weeks of gestation: Secondary | ICD-10-CM | POA: Diagnosis not present

## 2015-12-31 DIAGNOSIS — O09899 Supervision of other high risk pregnancies, unspecified trimester: Secondary | ICD-10-CM

## 2015-12-31 DIAGNOSIS — Z283 Underimmunization status: Secondary | ICD-10-CM

## 2015-12-31 DIAGNOSIS — Z2839 Other underimmunization status: Secondary | ICD-10-CM

## 2015-12-31 DIAGNOSIS — O479 False labor, unspecified: Secondary | ICD-10-CM

## 2015-12-31 DIAGNOSIS — O9989 Other specified diseases and conditions complicating pregnancy, childbirth and the puerperium: Principal | ICD-10-CM

## 2015-12-31 LAB — POCT FERN TEST: POCT Fern Test: NEGATIVE

## 2015-12-31 NOTE — MAU Note (Signed)
Pt presents with complaint of ? Leaking since 2100. Some ? Contractions and back pain.

## 2015-12-31 NOTE — Discharge Instructions (Signed)
Braxton Hicks Contractions °Contractions of the uterus can occur throughout pregnancy. Contractions are not always a sign that you are in labor.  °WHAT ARE BRAXTON HICKS CONTRACTIONS?  °Contractions that occur before labor are called Braxton Hicks contractions, or false labor. Toward the end of pregnancy (32-34 weeks), these contractions can develop more often and may become more forceful. This is not true labor because these contractions do not result in opening (dilatation) and thinning of the cervix. They are sometimes difficult to tell apart from true labor because these contractions can be forceful and people have different pain tolerances. You should not feel embarrassed if you go to the hospital with false labor. Sometimes, the only way to tell if you are in true labor is for your health care provider to look for changes in the cervix. °If there are no prenatal problems or other health problems associated with the pregnancy, it is completely safe to be sent home with false labor and await the onset of true labor. °HOW CAN YOU TELL THE DIFFERENCE BETWEEN TRUE AND FALSE LABOR? °False Labor °· The contractions of false labor are usually shorter and not as hard as those of true labor.   °· The contractions are usually irregular.   °· The contractions are often felt in the front of the lower abdomen and in the groin.   °· The contractions may go away when you walk around or change positions while lying down.   °· The contractions get weaker and are shorter lasting as time goes on.   °· The contractions do not usually become progressively stronger, regular, and closer together as with true labor.   °True Labor °· Contractions in true labor last 30-70 seconds, become very regular, usually become more intense, and increase in frequency.   °· The contractions do not go away with walking.   °· The discomfort is usually felt in the top of the uterus and spreads to the lower abdomen and low back.   °· True labor can be  determined by your health care provider with an exam. This will show that the cervix is dilating and getting thinner.   °WHAT TO REMEMBER °· Keep up with your usual exercises and follow other instructions given by your health care provider.   °· Take medicines as directed by your health care provider.   °· Keep your regular prenatal appointments.   °· Eat and drink lightly if you think you are going into labor.   °· If Braxton Hicks contractions are making you uncomfortable:   °¨ Change your position from lying down or resting to walking, or from walking to resting.   °¨ Sit and rest in a tub of warm water.   °¨ Drink 2-3 glasses of water. Dehydration may cause these contractions.   °¨ Do slow and deep breathing several times an hour.   °WHEN SHOULD I SEEK IMMEDIATE MEDICAL CARE? °Seek immediate medical care if: °· Your contractions become stronger, more regular, and closer together.   °· You have fluid leaking or gushing from your vagina.   °· You have a fever.   °· You pass blood-tinged mucus.   °· You have vaginal bleeding.   °· You have continuous abdominal pain.   °· You have low back pain that you never had before.   °· You feel your baby's head pushing down and causing pelvic pressure.   °· Your baby is not moving as much as it used to.   °  °This information is not intended to replace advice given to you by your health care provider. Make sure you discuss any questions you have with your health care   provider. °  °Document Released: 11/28/2005 Document Revised: 12/03/2013 Document Reviewed: 09/09/2013 °Elsevier Interactive Patient Education ©2016 Elsevier Inc. ° °

## 2015-12-31 NOTE — Progress Notes (Signed)
Called to give report regarding pt arrival in MAU and exam with bleeding. Notified of FHR variable and interventions. Will watch on monitor and CNM to see pt

## 2015-12-31 NOTE — MAU Provider Note (Signed)
None     Chief Complaint:  Contractions   Danielle Esparza is  35 y.o. G1P0000 at [redacted]w[redacted]d presents complaining of Contractions .  She states irregular, every 6-8 minutes contractions are associated with minimal vaginal bleeding, intact membranes, along with active fetal movement.   Obstetrical/Gynecological History: OB History    Gravida Para Term Preterm AB TAB SAB Ectopic Multiple Living        Obstetric Comments   Menstrual age: 43  Age 1st Pregnancy: N/A     Past Medical History: Past Medical History  Diagnosis Date  . Allergy     seasonal  . Nipple discharge 2014  . Hemoglobin A-S genotype/sickle cell trait 05/28/2015    Past Surgical History: History reviewed. No pertinent past surgical history.  Family History: Family History  Problem Relation Age of Onset  . Prostate cancer Father   . Sarcoidosis Father   . Breast cancer Paternal Grandmother   . Breast cancer Maternal Aunt 57    ovarian cancer at age 37    Social History: Social History  Substance Use Topics  . Smoking status: Never Smoker   . Smokeless tobacco: Never Used  . Alcohol Use: No    Allergies: No Known Allergies  Meds:  Prescriptions prior to admission  Medication Sig Dispense Refill Last Dose  . hydrocortisone (ANUSOL-HC) 25 MG suppository Place 1 suppository (25 mg total) rectally 2 (two) times daily. 12 suppository 0 Taking  . Misc. Devices (BREAST PUMP) MISC Use as directed. 1 each 0 Taking  . prenatal vitamin w/FE, FA (NATACHEW) 29-1 MG CHEW chewable tablet Chew 1 tablet by mouth daily at 12 noon.   Taking    Review of Systems   Constitutional: Negative for fever and chills Eyes: Negative for visual disturbances Respiratory: Negative for shortness of breath, dyspnea Cardiovascular: Negative for chest pain or palpitations  Gastrointestinal: Negative for vomiting, diarrhea and constipation Genitourinary: Negative for dysuria and urgency Musculoskeletal:  Negative for back pain, joint pain, myalgias.  Normal ROM  Neurological: Negative for dizziness and headaches    Physical Exam  Blood pressure 125/78, pulse 84, temperature 98.4 F (36.9 C), temperature source Oral, resp. rate 20, height 5' 4.75" (1.645 m), weight 81.194 kg (179 lb), last menstrual period 04/01/2015, SpO2 100 %. GENERAL: Well-developed, well-nourished female in no acute distress.  LUNGS: Clear to auscultation bilaterally.  HEART: Regular rate and rhythm. ABDOMEN: Soft, nontender, nondistended, gravid.  EXTREMITIES: Nontender, no edema, 2+ distal pulses. DTR's 2+ PELVIC:  SSE:  Small amount mucusy blood DC. Fern Neg.  CERVICAL EXAM: Dilatation 1cm   Effacement 0%   Station -2   Presentation: cephalic FHT:  Baseline rate 150 bpm   Variability moderate  Accelerations present   Decelerations:pt had a variable decel while on her back.  Turned over and FHR once again very reactive without decels Contractions:rare   Labs: Results for orders placed or performed during the hospital encounter of 12/31/15 (from the past 24 hour(s))  [facility] Test   Collection Time: 12/31/15 11:28 PM  Result Value Ref Range   POCT Fern Test Negative = intact amniotic membranes    Imaging Studies:  No results found.  Assessment: Danielle Esparza is  35 y.o. G1P0000 at [redacted]w[redacted]d presents with braxton hicks.  Plan: DC home. Labor precautions given  CRESENZO-DISHMAN,Aleksandar Duve 1/19/201711:31 PM

## 2016-01-02 ENCOUNTER — Inpatient Hospital Stay (HOSPITAL_COMMUNITY): Payer: BC Managed Care – PPO | Admitting: Anesthesiology

## 2016-01-02 ENCOUNTER — Inpatient Hospital Stay (HOSPITAL_COMMUNITY)
Admission: AD | Admit: 2016-01-02 | Discharge: 2016-01-04 | DRG: 775 | Disposition: A | Discharge status: Home / Self Care | Payer: BC Managed Care – PPO | Source: Ambulatory Visit | Attending: Obstetrics & Gynecology | Admitting: Obstetrics & Gynecology

## 2016-01-02 ENCOUNTER — Encounter (HOSPITAL_COMMUNITY): Payer: Self-pay | Admitting: *Deleted

## 2016-01-02 DIAGNOSIS — Z3A39 39 weeks gestation of pregnancy: Secondary | ICD-10-CM | POA: Diagnosis not present

## 2016-01-02 DIAGNOSIS — O9989 Other specified diseases and conditions complicating pregnancy, childbirth and the puerperium: Secondary | ICD-10-CM

## 2016-01-02 DIAGNOSIS — IMO0001 Reserved for inherently not codable concepts without codable children: Secondary | ICD-10-CM

## 2016-01-02 DIAGNOSIS — O411230 Chorioamnionitis, third trimester, not applicable or unspecified: Secondary | ICD-10-CM | POA: Diagnosis present

## 2016-01-02 DIAGNOSIS — O9902 Anemia complicating childbirth: Secondary | ICD-10-CM | POA: Diagnosis present

## 2016-01-02 DIAGNOSIS — D573 Sickle-cell trait: Secondary | ICD-10-CM | POA: Diagnosis present

## 2016-01-02 DIAGNOSIS — Z2839 Other underimmunization status: Secondary | ICD-10-CM

## 2016-01-02 DIAGNOSIS — O4292 Full-term premature rupture of membranes, unspecified as to length of time between rupture and onset of labor: Secondary | ICD-10-CM | POA: Diagnosis not present

## 2016-01-02 DIAGNOSIS — Z3403 Encounter for supervision of normal first pregnancy, third trimester: Secondary | ICD-10-CM

## 2016-01-02 DIAGNOSIS — Z283 Underimmunization status: Secondary | ICD-10-CM

## 2016-01-02 LAB — TYPE AND SCREEN
ABO/RH(D): O POS
Antibody Screen: NEGATIVE

## 2016-01-02 LAB — CBC
HEMATOCRIT: 33.2 % — AB (ref 36.0–46.0)
HEMOGLOBIN: 11.6 g/dL — AB (ref 12.0–15.0)
MCH: 29.6 pg (ref 26.0–34.0)
MCHC: 34.9 g/dL (ref 30.0–36.0)
MCV: 84.7 fL (ref 78.0–100.0)
PLATELETS: 232 10*3/uL (ref 150–400)
RBC: 3.92 MIL/uL (ref 3.87–5.11)
RDW: 14.2 % (ref 11.5–15.5)
WBC: 11 10*3/uL — AB (ref 4.0–10.5)

## 2016-01-02 LAB — POCT FERN TEST: POCT Fern Test: POSITIVE

## 2016-01-02 MED ORDER — ACETAMINOPHEN 325 MG PO TABS: 325.0000 mg | ORAL_TABLET | Freq: Once | ORAL | Status: DC

## 2016-01-02 MED ORDER — SENNOSIDES-DOCUSATE SODIUM 8.6-50 MG PO TABS
2.0000 | ORAL_TABLET | ORAL | Status: DC
  Administered 2016-01-03 – 2016-01-04 (×2): 2 via ORAL
  Filled 2016-01-02 (×2): qty 2

## 2016-01-02 MED ORDER — ONDANSETRON HCL 4 MG/2ML IJ SOLN: 4.0000 mg | Freq: Four times a day (QID) | INTRAMUSCULAR | Status: DC | PRN

## 2016-01-02 MED ORDER — DIPHENHYDRAMINE HCL 50 MG/ML IJ SOLN: 12.5000 mg | INTRAMUSCULAR | Status: DC | PRN

## 2016-01-02 MED ORDER — OXYCODONE-ACETAMINOPHEN 5-325 MG PO TABS: 1.0000 | ORAL_TABLET | ORAL | Status: DC | PRN

## 2016-01-02 MED ORDER — DIBUCAINE 1 % RE OINT: 1.0000 "application " | TOPICAL_OINTMENT | RECTAL | Status: DC | PRN

## 2016-01-02 MED ORDER — ACETAMINOPHEN 325 MG PO TABS
650.0000 mg | ORAL_TABLET | ORAL | Status: DC | PRN
  Administered 2016-01-02: 650 mg via ORAL
  Filled 2016-01-02: qty 2

## 2016-01-02 MED ORDER — ACETAMINOPHEN 325 MG PO TABS: 650.0000 mg | ORAL_TABLET | ORAL | Status: DC | PRN

## 2016-01-02 MED ORDER — DIPHENHYDRAMINE HCL 25 MG PO CAPS: 25.0000 mg | ORAL_CAPSULE | Freq: Four times a day (QID) | ORAL | Status: DC | PRN

## 2016-01-02 MED ORDER — EPHEDRINE 5 MG/ML INJ
10.0000 mg | INTRAVENOUS | Status: DC | PRN
  Filled 2016-01-02: qty 2

## 2016-01-02 MED ORDER — LACTATED RINGERS IV BOLUS (SEPSIS)
1000.0000 mL | Freq: Once | INTRAVENOUS | Status: AC
  Administered 2016-01-02: 1000 mL via INTRAVENOUS

## 2016-01-02 MED ORDER — OXYCODONE-ACETAMINOPHEN 5-325 MG PO TABS
2.0000 | ORAL_TABLET | ORAL | Status: DC | PRN
Start: 2016-01-02 — End: 2016-01-02

## 2016-01-02 MED ORDER — NALBUPHINE HCL 10 MG/ML IJ SOLN
10.0000 mg | Freq: Once | INTRAMUSCULAR | Status: AC
  Administered 2016-01-02: 10 mg via INTRAMUSCULAR
  Filled 2016-01-02: qty 1

## 2016-01-02 MED ORDER — PROMETHAZINE HCL 25 MG/ML IJ SOLN
25.0000 mg | Freq: Once | INTRAMUSCULAR | Status: AC
  Administered 2016-01-02: 25 mg via INTRAMUSCULAR
  Filled 2016-01-02: qty 1

## 2016-01-02 MED ORDER — ONDANSETRON HCL 4 MG PO TABS: 4.0000 mg | ORAL_TABLET | ORAL | Status: DC | PRN

## 2016-01-02 MED ORDER — PHENYLEPHRINE 40 MCG/ML (10ML) SYRINGE FOR IV PUSH (FOR BLOOD PRESSURE SUPPORT)
80.0000 ug | PREFILLED_SYRINGE | INTRAVENOUS | Status: DC | PRN
  Filled 2016-01-02: qty 2
  Filled 2016-01-02: qty 20

## 2016-01-02 MED ORDER — SIMETHICONE 80 MG PO CHEW: 80.0000 mg | CHEWABLE_TABLET | ORAL | Status: DC | PRN

## 2016-01-02 MED ORDER — LANOLIN HYDROUS EX OINT: TOPICAL_OINTMENT | CUTANEOUS | Status: DC | PRN

## 2016-01-02 MED ORDER — ZOLPIDEM TARTRATE 5 MG PO TABS: 5.0000 mg | ORAL_TABLET | Freq: Every evening | ORAL | Status: DC | PRN

## 2016-01-02 MED ORDER — BENZOCAINE-MENTHOL 20-0.5 % EX AERO
1.0000 "application " | INHALATION_SPRAY | CUTANEOUS | Status: DC | PRN
  Administered 2016-01-03: 1 via TOPICAL
  Filled 2016-01-02: qty 56

## 2016-01-02 MED ORDER — LACTATED RINGERS IV SOLN
INTRAVENOUS | Status: DC
  Administered 2016-01-02 (×2): via INTRAVENOUS

## 2016-01-02 MED ORDER — OXYTOCIN BOLUS FROM INFUSION
500.0000 mL | INTRAVENOUS | Status: DC
  Administered 2016-01-02: 500 mL via INTRAVENOUS

## 2016-01-02 MED ORDER — WITCH HAZEL-GLYCERIN EX PADS: 1.0000 "application " | MEDICATED_PAD | CUTANEOUS | Status: DC | PRN

## 2016-01-02 MED ORDER — FENTANYL 2.5 MCG/ML BUPIVACAINE 1/10 % EPIDURAL INFUSION (WH - ANES)
14.0000 mL/h | INTRAMUSCULAR | Status: DC | PRN
  Filled 2016-01-02: qty 125

## 2016-01-02 MED ORDER — NALBUPHINE HCL 10 MG/ML IJ SOLN
10.0000 mg | Freq: Once | INTRAMUSCULAR | Status: AC
  Administered 2016-01-02: 10 mg via INTRAVENOUS
  Filled 2016-01-02: qty 1

## 2016-01-02 MED ORDER — CITRIC ACID-SODIUM CITRATE 334-500 MG/5ML PO SOLN: 30.0000 mL | ORAL | Status: DC | PRN

## 2016-01-02 MED ORDER — IBUPROFEN 600 MG PO TABS
600.0000 mg | ORAL_TABLET | Freq: Four times a day (QID) | ORAL | Status: DC
  Administered 2016-01-02 – 2016-01-04 (×7): 600 mg via ORAL
  Filled 2016-01-02 (×7): qty 1

## 2016-01-02 MED ORDER — LACTATED RINGERS IV SOLN
2.5000 [IU]/h | INTRAVENOUS | Status: DC
  Filled 2016-01-02: qty 4

## 2016-01-02 MED ORDER — MEASLES, MUMPS & RUBELLA VAC ~~LOC~~ INJ
0.5000 mL | INJECTION | Freq: Once | SUBCUTANEOUS | Status: AC
  Administered 2016-01-04: 0.5 mL via SUBCUTANEOUS
  Filled 2016-01-02: qty 0.5

## 2016-01-02 MED ORDER — FLEET ENEMA 7-19 GM/118ML RE ENEM: 1.0000 | ENEMA | RECTAL | Status: DC | PRN

## 2016-01-02 MED ORDER — LACTATED RINGERS IV SOLN
500.0000 mL | INTRAVENOUS | Status: DC | PRN
  Administered 2016-01-02 (×2): 500 mL via INTRAVENOUS

## 2016-01-02 MED ORDER — PRENATAL MULTIVITAMIN CH
1.0000 | ORAL_TABLET | Freq: Every day | ORAL | Status: DC
  Administered 2016-01-03 – 2016-01-04 (×2): 1 via ORAL
  Filled 2016-01-02 (×2): qty 1

## 2016-01-02 MED ORDER — LIDOCAINE HCL (PF) 1 % IJ SOLN
30.0000 mL | INTRAMUSCULAR | Status: DC | PRN
  Filled 2016-01-02 (×2): qty 30

## 2016-01-02 MED ORDER — ONDANSETRON HCL 4 MG/2ML IJ SOLN: 4.0000 mg | INTRAMUSCULAR | Status: DC | PRN

## 2016-01-02 MED ORDER — TETANUS-DIPHTH-ACELL PERTUSSIS 5-2.5-18.5 LF-MCG/0.5 IM SUSP: 0.5000 mL | Freq: Once | INTRAMUSCULAR | Status: DC

## 2016-01-02 NOTE — MAU Note (Signed)
Contractions since 0200. Some bloody show. Leaking some urine but don't think water is broken

## 2016-01-02 NOTE — Progress Notes (Signed)
Philipp Deputy CNM reviewed FM strip and ok to give pain meds

## 2016-01-02 NOTE — Progress Notes (Signed)
Dozing between contractions and feels better

## 2016-01-02 NOTE — Progress Notes (Signed)
Philipp Deputy CNM notified of variables.

## 2016-01-02 NOTE — H&P (Signed)
LABOR ADMISSION HISTORY AND PHYSICAL  Danielle Esparza is a 35 y.o. female G1P0000 with IUP at [redacted]w[redacted]d presenting for contractions. They began at 2 am this morning.  Some mild bloody show but no LOF.  No VB.  Good FM.  No no blurry vision, headaches or peripheral edema, and RUQ pain. Pt had SROM while in MAU triage around 7:30 this am.  Plan for admission to birthing suites for labor mgmt. She plans on breast feeding. She request OCPs for birth control.  Dating: By LMP + 7wk u/s --->  Estimated Date of Delivery: 01/06/16  Sono:    , CWD, normal anatomy, breech presentation, 257g, 62% EFW   Prenatal History/Complications: Rubella non-immune Hemoglobin AS  Past Medical History: Past Medical History  Diagnosis Date  . Allergy     seasonal  . Nipple discharge 2014  . Hemoglobin A-S genotype/sickle cell trait 05/28/2015    Past Surgical History: Past Surgical History  Procedure Laterality Date  . No past surgeries      Obstetrical History: OB History    Gravida Para Term Preterm AB TAB SAB Ectopic Multiple Living        Obstetric Comments   Menstrual age: 26  Age 1st Pregnancy: N/A      Social History: Social History   Social History  . Marital Status: Married    Spouse Name: N/A  . Number of Children: N/A  . Years of Education: N/A   Social History Main Topics  . Smoking status: Never Smoker   . Smokeless tobacco: Never Used  . Alcohol Use: No  . Drug Use: No  . Sexual Activity:    Partners: Male    Birth Control/ Protection: OCP, None   Other Topics Concern  . None   Social History Narrative    Family History: Family History  Problem Relation Age of Onset  . Prostate cancer Father   . Sarcoidosis Father   . Breast cancer Paternal Grandmother   . Breast cancer Maternal Aunt 69    ovarian cancer at age 6    Allergies: No Known Allergies  Prescriptions prior to admission  Medication Sig Dispense Refill Last Dose  . prenatal  vitamin w/FE, FA (NATACHEW) 29-1 MG CHEW chewable tablet Chew 1 tablet by mouth daily at 12 noon.   Past Week at Unknown time  . hydrocortisone (ANUSOL-HC) 25 MG suppository Place 1 suppository (25 mg total) rectally 2 (two) times daily. (Patient not taking: Reported on 01/02/2016) 12 suppository 0 Not Taking at Unknown time  . Misc. Devices (BREAST PUMP) MISC Use as directed. 1 each 0 Taking     Review of Systems   All systems reviewed and negative except as stated in HPI  BP 118/64 mmHg  Pulse 89  Temp(Src) 98.2 F (36.8 C) (Oral)  Resp 16  Ht  (1.651 m)  Wt 80.287 kg (177 lb)  BMI 29.45 kg/m2  LMP 04/01/2015 (Exact Date) General appearance: alert, cooperative, appears stated age and mild distress Lungs: clear to auscultation bilaterally Heart: regular rate and rhythm Abdomen: soft, non-tender; bowel sounds normal Pelvic: adequate, see CVE below  Extremities: Homans sign is negative, no sign of DVT, edema DTR's nl Presentation: cephalic Fetal monitoringBaseline: 150 bpm, Variability: Fair (1-6 bpm), Accelerations: Reactive and Decelerations: Variable: mild Uterine activityDuration: q2 minutes  Dilation: 2 Effacement (%): 100 Station: -1 Exam by:: Quintella Baton RNc   Prenatal labs: ABO, Rh: O/POS/-- (06/14  1330) Antibody: NEG (06/14 1330) Rubella: !Error!non-immune RPR: NON REAC (11/01 0928)  HBsAg: NEGATIVE (06/14 1330)  HIV: NONREACTIVE (11/01 0928)  GBS: Negative (12/28 0000)  1 hr Glucola T3-128 Genetic screening  wnl Anatomy US wnl  Prenatal Transfer Tool  Maternal Diabetes: No Genetic Screening: Normal Maternal Ultrasounds/Referrals: Normal Fetal Ultrasounds or other Referrals:  None Maternal Substance Abuse:  No Significant Maternal Medications:  None Significant Maternal Lab Results: Lab values include: Other: rubella non-immune  Results for orders placed or performed during the hospital encounter of 01/02/16 (from the past 24 hour(s))  Fern Test    Collection Time: 01/02/16  7:25 AM  Result Value Ref Range   POCT Fern Test Positive = ruptured amniotic membanes   CBC   Collection Time: 01/02/16  7:40 AM  Result Value Ref Range   WBC 11.0 (H) 4.0 - 10.5 K/uL   RBC 3.92 3.87 - 5.11 MIL/uL   Hemoglobin 11.6 (L) 12.0 - 15.0 g/dL   HCT 40.9 (L) 81.1 - 91.4 %   MCV 84.7 78.0 - 100.0 fL   MCH 29.6 26.0 - 34.0 pg   MCHC 34.9 30.0 - 36.0 g/dL   RDW 78.2 95.6 - 21.3 %   Platelets 232 150 - 400 K/uL    Patient Active Problem List   Diagnosis Date Noted  . Active labor at term 01/02/2016  . Hemoglobin A-S genotype/sickle cell trait 05/28/2015  . Rubella non-immune status, antepartum 05/27/2015  . Supervision of normal first pregnancy 05/26/2015  . Cervical high risk HPV (human papillomavirus) test positive 10/14/2013  . Lump or mass in breast 10/08/2013  . Nipple discharge 06/18/2013    Assessment: Danielle Esparza is a 35 y.o. G1P0000 at [redacted]w[redacted]d here for contractions, SROM, labor.   # Labor: latent labor with SROM  -admit to L&D with routine orders   # Fetal Wellbeing: fetus well appearing, Category II, intermittent variables -Continue monitoring   # Pain Control:  -analgesia prn -pt desires epidural  # GBS: neg  # Postpartum plan:  -Feeding: Br -Contraception: OCPs  Wynne Dust, MD, PGY-1  OB  I spoke with and examined patient and agree with resident/PA/SNM's note and plan of care.  Expectant management Cheral Marker, CNM, [facility] 01/02/2016 11:49 AM

## 2016-01-02 NOTE — Progress Notes (Signed)
Patient ID: Danielle Esparza, female   DOB: [birth year], 35 y.o.   MRN: [id number] Danielle Esparza is a 35 y.o. G1P0000 at [redacted]w[redacted]d admitted for rupture of membranes, early labor  Subjective: Uncomfortable w/ uc's, requesting epidural  Objective: BP 113/71 mmHg  Pulse 88  Temp(Src) 98.2 F (36.8 C) (Oral)  Resp 18  Ht  (1.651 m)  Wt 80.287 kg (177 lb)  BMI 29.45 kg/m2  SpO2 100%  LMP 04/01/2015 (Exact Date)    FHT:  FHR: 150 bpm, variability: moderate,  accelerations:  Abscent,  decelerations:  Present variable and prolonged x 1- now resolved UC:   q 1-5  SVE:   Dilation: 2 Effacement (%): 100 Station: -1 Exam by:: Quintella Baton RNc   Labs: Lab Results  Component Value Date   WBC 11.0* 01/02/2016   HGB 11.6* 01/02/2016   HCT 33.2* 01/02/2016   MCV 84.7 01/02/2016   PLT 232 01/02/2016    Assessment / Plan: Spontaneous labor, progressing normally  Labor: Progressing normally Fetal Wellbeing:  Category II Pain Control:  requesting epidural Pre-eclampsia: n/a I/D:  n/a Anticipated MOD:  NSVD  Marge Duncans CNM, [facility]NP-BC 01/02/2016, [phone]

## 2016-01-02 NOTE — Anesthesia Preprocedure Evaluation (Signed)

## 2016-01-02 NOTE — Progress Notes (Signed)
Up to BR.

## 2016-01-02 NOTE — Progress Notes (Signed)
Philipp Deputy CNM on unit and aware of pt's admission and status. Will observe and hour and reck cervix

## 2016-01-02 NOTE — Progress Notes (Signed)
pericare after srom

## 2016-01-02 NOTE — Progress Notes (Signed)
Dr Audrea Muscat notified of SROM with clear fld at [facility]. MD will put in admit orders.

## 2016-01-02 NOTE — Progress Notes (Signed)
Assisted pt oob to bathroom, voided s difficulty and emptied bladder, peri care given, back to bed s incident.

## 2016-01-03 ENCOUNTER — Encounter (HOSPITAL_COMMUNITY): Payer: Self-pay | Admitting: Women's Health

## 2016-01-03 LAB — RPR: RPR: NONREACTIVE

## 2016-01-03 LAB — ABO/RH: ABO/RH(D): O POS

## 2016-01-03 NOTE — Progress Notes (Signed)
Post Partum Day 1 Subjective: Eating, drinking, voiding, ambulating well.  +flatus.  Lochia and pain wnl.  Denies dizziness, lightheadedness, or sob. No complaints.   Objective: Blood pressure 91/57, pulse 93, temperature 98.3 F (36.8 C), temperature source Oral, resp. rate 20, height  (1.651 m), weight 80.287 kg (177 lb), last menstrual period 04/01/2015, SpO2 100 %, unknown if currently breastfeeding.  Physical Exam:  General: alert, cooperative and no distress Lochia: appropriate Uterine Fundus: firm Incision: n/a DVT Evaluation: No evidence of DVT seen on physical exam. Negative Homan's sign. No cords or calf tenderness. No significant calf/ankle edema.   Recent Labs  01/02/16 0740  HGB 11.6*  HCT 33.2*    Assessment/Plan: Plan for discharge tomorrow, Breastfeeding, Lactation consult and Contraception plans POPs   LOS: 1 day   Marge Duncans 01/03/2016, 7:28 AM

## 2016-01-03 NOTE — Lactation Note (Signed)
This note was copied from the chart of Danielle Esparza. Lactation Consultation Note Initial visit at 25 hours of age.  Mom reports having difficulty with baby being sleepy and not latching well.  Mom is unable to express colostrum from left breast.  Attempted latch in football hold on left breast.  Baby will not maintain feeding.  Assisted with latch on right breast in football hold and baby latched well with sucking bursts, but needed stimulation to maintain 15 minute feeding.  Few swallows audible and baby is not vigorous at the breast yet.  Mom is post pumping for 15 minutes and then will work on hand expression to collect EBM to give back to the baby.  Discussed cluster feeding and to all RN if baby misses a feeding.  Baby may need additional supplementation if not eating well this evening.  Encouraged mom to pump after feedings.  FOB at bedside supportive.  [facility] LC resources given and discussed.  Encouraged to feed with early cues on demand.  Early newborn behavior discussed.  Mom to call for assist as needed.      Patient Name: Danielle Esparza Today's Date: 01/03/2016 Reason for consult: Initial assessment   Maternal Data Has patient been taught Hand Expression?: Yes Does the patient have breastfeeding experience prior to this delivery?: No  Feeding Feeding Type: Breast Fed Length of feed: 15 min  LATCH Score/Interventions Latch: Repeated attempts needed to sustain latch, nipple held in mouth throughout feeding, stimulation needed to elicit sucking reflex. Intervention(s): Adjust position;Assist with latch;Breast massage;Breast compression  Audible Swallowing: A few with stimulation Intervention(s): Skin to skin Intervention(s): Skin to skin;Hand expression  Type of Nipple: Everted at rest and after stimulation  Comfort (Breast/Nipple): Soft / non-tender     Hold (Positioning): Assistance needed to correctly position infant at breast and maintain latch. Intervention(s):  Breastfeeding basics reviewed;Support Pillows;Position options;Skin to skin  LATCH Score: 7  Lactation Tools Discussed/Used     Consult Status Consult Status: Follow-up Date: 01/04/16 Follow-up type: In-patient    Mahalie Kanner, Arvella Merles 01/03/2016, 5:58 PM

## 2016-01-03 NOTE — Anesthesia Postprocedure Evaluation (Signed)
Anesthesia Post Note  Patient: [name]  Procedure(s) Performed: * No procedures listed *  Patient location during evaluation: Mother Baby Anesthesia Type: Epidural Level of consciousness: awake and alert and oriented Pain management: satisfactory to patient Vital Signs Assessment: post-procedure vital signs reviewed and stable Respiratory status: spontaneous breathing and nonlabored ventilation Cardiovascular status: stable Postop Assessment: no headache, no backache, no signs of nausea or vomiting, adequate PO intake and patient able to bend at knees (patient up walking) Anesthetic complications: no    Last Vitals:  Filed Vitals:   01/03/16 0000 01/03/16 0617  BP: 116/66 91/57  Pulse: 108 93  Temp: 37 C 36.8 C  Resp: 18 20    Last Pain:  Filed Vitals:   01/03/16 0710  PainSc: 0-No pain                 Roland Prine

## 2016-01-04 MED ORDER — NORETHINDRONE 0.35 MG PO TABS: 1.0000 | ORAL_TABLET | Freq: Every day | ORAL | Status: DC

## 2016-01-04 MED ORDER — IBUPROFEN 600 MG PO TABS: 600.0000 mg | ORAL_TABLET | Freq: Four times a day (QID) | ORAL | Status: DC

## 2016-01-04 NOTE — Lactation Note (Signed)
This note was copied from the chart of Danielle Esparza. Lactation Consultation Note Having difficulty latching baby. RN called for assistance in latching. RN had baby latched in cross cradle position perfectly, baby suckeling well. Mom has large breast, encouraged to massage at intervals  During BF. Answered a few questions. Encouraged to do some hand expression. And STS.  Patient Name: Danielle Esparza [id number] Date: 01/04/2016 Reason for consult: Follow-up assessment;Difficult latch   Maternal Data    Feeding Feeding Type: Breast Fed Length of feed: 10 min  LATCH Score/Interventions Latch: Repeated attempts needed to sustain latch, nipple held in mouth throughout feeding, stimulation needed to elicit sucking reflex. Intervention(s): Adjust position;Assist with latch;Breast massage;Breast compression  Audible Swallowing: A few with stimulation Intervention(s): Hand expression;Skin to skin Intervention(s): Hand expression;Alternate breast massage  Type of Nipple: Everted at rest and after stimulation  Comfort (Breast/Nipple): Soft / non-tender     Hold (Positioning): Assistance needed to correctly position infant at breast and maintain latch. Intervention(s): Skin to skin;Position options;Breastfeeding basics reviewed;Support Pillows  LATCH Score: 7  Lactation Tools Discussed/Used     Consult Status Consult Status: Follow-up Date: 01/04/16 Follow-up type: In-patient    Charyl Dancer 01/04/2016, 5:56 AM

## 2016-01-04 NOTE — Lactation Note (Signed)
This note was copied from the chart of Danielle Esparza. Lactation Consultation Note  Patient Name: Danielle Sharmane Dame Today's Date: 01/04/2016 Reason for consult: Follow-up assessment  Baby is for discharge today - 4% weight loss, Latch score range 8-9. Breast feeding range - 10-50 mins. 3 voids , 5 stools , Bili- check at 32 hours 7.6. Mom called out for Latch assist . LC reviewed basics of latching and assessed breast tissue with hand expressing  And noted the areolas to be semi compressible after breast massage , hand express, reverse pressure it improved  the compressibility of the areola. Baby awake and hungry, baby 1st tried to latch by nibbling onto the breast . LC worked  With mom and baby to tickle upper lip until the baby opened wide and latched with depth , firm support and baby fed for 20 mins  In the am .  2nd visit - mom called and mom was sitting on the couch , changed position to cross cradle and the baby opened wide and latched with depth while  Mom did breast compressions until swallows as instructed. Baby released short interval and mom was able to latch baby on in cross cradle with depth and  Comfort.  LC reviewed prevention and tx of sore nipples and engorgement. LC instructed mom on the shells, hand pump , increased flange when milk comes in , and comfort Gels.  Mother informed of post-discharge support and given phone number to the lactation department, including services for phone call assistance; out-patient appointments; and breastfeeding support group. List of other breastfeeding resources in the community given in the handout. Encouraged mother to call for problems or concerns related to breastfeeding.   Maternal Data Has patient been taught Hand Expression?: Yes  Feeding Feeding Type: Breast Fed (football / right breast ) Length of feed: 12 min  LATCH Score/Interventions Latch: Grasps breast easily, tongue down, lips flanged, rhythmical  sucking. Intervention(s): Adjust position;Assist with latch;Breast massage;Breast compression  Audible Swallowing: Spontaneous and intermittent Intervention(s): Hand expression;Skin to skin Intervention(s): Hand expression;Alternate breast massage  Type of Nipple: Everted at rest and after stimulation (semi compressible areolas, semi - flat )  Comfort (Breast/Nipple): Soft / non-tender     Hold (Positioning): Assistance needed to correctly position infant at breast and maintain latch. Intervention(s): Breastfeeding basics reviewed;Support Pillows;Position options  LATCH Score: 9  Lactation Tools Discussed/Used     Consult Status Consult Status: Follow-up Date: 01/04/16 Follow-up type: In-patient    Kathrin Greathouse 01/04/2016, 9:44 AM

## 2016-01-04 NOTE — Discharge Summary (Signed)
OB Discharge Summary  Patient Name: Danielle Esparza DOB: [birth year] MRN: [id number]  Date of admission: 01/02/2016 Delivering MD:     Date of discharge: 01/04/2016  Admitting diagnosis: 39wks, contractions Intrauterine pregnancy: [redacted]w[redacted]d     Secondary diagnosis:Active Problems:   Active labor at term  Additional problems:none     Discharge diagnosis: Term Pregnancy Delivered                                                                     Post partum procedures:none  Augmentation: none  Complications: None  Hospital course:  Onset of Labor With Vaginal Delivery     35 y.o. yo G1P1001 at [redacted]w[redacted]d was admitted in Active Labor on 01/02/2016. Patient had an uncomplicated labor course as follows:  Membrane Rupture Time/Date: 7:15 AM ,01/02/2016   Intrapartum Procedures: Episiotomy: None [1]                                         Lacerations:  1st degree [2];Sulcus [9]  Patient had a delivery of a Viable infant. 01/02/2016  Information for the patient's newborn:  Zeinab, Rodwell Girl Siya [[id number]]  Delivery Method: Vaginal, Spontaneous Delivery (Filed from Delivery Summary)    Pateint had an uncomplicated postpartum course.  She is ambulating, tolerating a regular diet, passing flatus, and urinating well. Patient is discharged home in stable condition on 01/04/2016.    Physical exam  Filed Vitals:   01/03/16 0000 01/03/16 0617 01/03/16 1807 01/04/16 0622  BP: 116/66 91/57 103/63 114/59  Pulse: 108 93 98 72  Temp: 98.6 F (37 C) 98.3 F (36.8 C) 97.9 F (36.6 C) 97.7 F (36.5 C)  TempSrc: Oral Oral Oral Oral  Resp: Height:      Weight:      SpO2:   96%    General: alert, cooperative and no distress Lochia: appropriate Uterine Fundus: firm Incision: N/A DVT Evaluation: Negative Homan's sign. No cords or calf tenderness. Labs: Lab Results  Component Value Date   WBC 11.0* 01/02/2016   HGB 11.6* 01/02/2016   HCT 33.2* 01/02/2016   MCV 84.7  01/02/2016   PLT 232 01/02/2016   CMP Latest Ref Rng 05/14/2014  Glucose 65-99 mg/dL 77  BUN 7-82 mg/dL 7  Creatinine 9.56-2.13 mg/dL 0.86  Sodium 578-469 mmol/L 138  Potassium 3.5-5.1 mmol/L 3.4(L)  Chloride 98-107 mmol/L 105  CO2 21-32 mmol/L 25  Calcium 8.5-10.1 mg/dL 9.2  Total Protein 6.2-9.5 g/dL 2.8(U)  Total Bilirubin 0.2-1.0 mg/dL 0.6  Alkaline Phos - 57  AST 15-37 Unit/L 28  ALT 12-78 U/L 32    Discharge instruction: per After Visit Summary and "Baby and Me Booklet".  After Visit Meds:    Medication List    ASK your doctor about these medications        Breast Pump Misc  Use as directed.     hydrocortisone 25 MG suppository  Commonly known as:  ANUSOL-HC  Place 1 suppository (25 mg total) rectally 2 (two) times daily.     prenatal vitamin w/FE, FA 29-1 MG Chew chewable tablet  Chew 1 tablet  by mouth daily at 12 noon.        Diet: routine diet  Activity: Advance as tolerated. Pelvic rest for 6 weeks.   Outpatient follow up:6 weeks Follow up Appt:Future Appointments Date Time Provider Department Center  01/07/2016 8:45 AM Tereso Newcomer, MD [facility]  01/11/2016 8:00 AM [facility] NURSE [facility]   Follow up visit: No Follow-up on file.  Postpartum contraception: Condoms  Newborn Data: Live born female  Birth Weight: 7 lb 5.3 oz (3325 g) APGAR: 8, 9  Baby Feeding: Breast Disposition:home with mother   01/04/2016 Ferdie Ping, CNM

## 2016-01-07 ENCOUNTER — Encounter: Payer: BC Managed Care – PPO | Admitting: Obstetrics & Gynecology

## 2016-01-11 ENCOUNTER — Other Ambulatory Visit: Payer: BC Managed Care – PPO

## 2016-01-13 ENCOUNTER — Encounter (HOSPITAL_COMMUNITY): Payer: Self-pay | Admitting: *Deleted

## 2016-01-19 ENCOUNTER — Ambulatory Visit (INDEPENDENT_AMBULATORY_CARE_PROVIDER_SITE_OTHER): Payer: BC Managed Care – PPO | Admitting: Obstetrics & Gynecology

## 2016-01-19 VITALS — BP 109/77 | HR 85 | Wt 159.0 lb

## 2016-01-19 DIAGNOSIS — R319 Hematuria, unspecified: Secondary | ICD-10-CM

## 2016-01-19 MED ORDER — SULFAMETHOXAZOLE-TRIMETHOPRIM 800-160 MG PO TABS: 1.0000 | ORAL_TABLET | Freq: Two times a day (BID) | ORAL | Status: DC

## 2016-01-19 NOTE — Progress Notes (Signed)
   Subjective:    Patient ID: Danielle Esparza, female    DOB: 10/16/1981, 35 y.o.   MRN: [id number]  HPI 35 yo AA lady is here with frank hematuria since yesterday. She had a vaginal delivery 3 weeks ago. She denies dysuria but did have frank hematuria with a UTI at ager 22.   Review of Systems She is breastfeeding.    Objective:   Physical Exam [id number] Breathing, conversing, and ambulating normally I removed a long piece of suture hanging free. Her vulva has healed well Minimal lochia. I placed a tampon prior to getting a urine sample. The urine was frankly bloody.       Assessment & Plan:  Hematuria- I will culture her urine and treat with bactrim Referral to urology

## 2016-01-19 NOTE — Progress Notes (Signed)
Pt believes she was having blood in her urine while urinating, denies any pain or discomfort with urination or urinary frequency.

## 2016-01-20 ENCOUNTER — Encounter: Payer: Self-pay | Admitting: Obstetrics and Gynecology

## 2016-01-20 ENCOUNTER — Ambulatory Visit (INDEPENDENT_AMBULATORY_CARE_PROVIDER_SITE_OTHER): Payer: BC Managed Care – PPO | Admitting: Obstetrics and Gynecology

## 2016-01-20 VITALS — BP 122/86 | HR 8 | Resp 16 | Ht 65.0 in | Wt 156.7 lb

## 2016-01-20 DIAGNOSIS — R310 Gross hematuria: Secondary | ICD-10-CM

## 2016-01-20 NOTE — Progress Notes (Addendum)
01/20/2016 1:43 PM   Danielle Esparza 1981-07-26 [id number]  Referring provider: Allie Bossier, MD [address] [place], [place] [address]  Chief Complaint  Patient presents with  . Hematuria  . Establish Care    HPI: Patient is a 35 year old African-American female presenting today as a referral from her OB/GYN provider or gross hematuria for the last 2 days. She is 3 weeks postpartum normal vaginal delivery. She was seen by her GYN provider yesterday and noted to have gross hematuria on UA. Per documentation she had a normal pelvic examination. Her urine was sent for a culture and she was started on Bactrim.  Patient reports that she noticed some blood in her urine beginning 2 days ago and now it has become darker with small clots. She has been experiencing some mild bilateral lower back pain since last night but reports that it has resolved today. Mild bilateral lower back pain last night.  No dysuria, fevers, nausea or vomiting.    She does report one prior episode of gross hematuria approximately 10 years ago when she was diagnosed with urinary tract infection. Gross hematuria 10 years ago with  UTI.   No prior history of kidney stones.  PMH: Past Medical History  Diagnosis Date  . Allergy     seasonal  . Nipple discharge 2014  . Hemoglobin A-S genotype/sickle cell trait 05/28/2015    Surgical History: Past Surgical History  Procedure Laterality Date  . No past surgeries      Home Medications:    Medication List       This list is accurate as of: 01/20/16  1:43 PM.  Always use your most recent med list.               JOLIVETTE 0.35 MG tablet  Generic drug:  norethindrone  TK 1 T PO QD     prenatal vitamin w/FE, FA 29-1 MG Chew chewable tablet  Chew 1 tablet by mouth daily at 12 noon.     sulfamethoxazole-trimethoprim 800-160 MG tablet  Commonly known as:  BACTRIM DS,SEPTRA DS  Take 1 tablet by mouth 2 (two) times daily.        Allergies: No Known  Allergies  Family History: Family History  Problem Relation Age of Onset  . Prostate cancer Father   . Sarcoidosis Father   . Breast cancer Paternal Grandmother   . Breast cancer Maternal Aunt 63    ovarian cancer at age 64    Social History:  reports that she has never smoked. She has never used smokeless tobacco. She reports that she does not drink alcohol or use illicit drugs.  ROS: UROLOGY Frequent Urination?: No Hard to postpone urination?: No Burning/pain with urination?: No Get up at night to urinate?: No Leakage of urine?: Yes Urine stream starts and stops?: No Trouble starting stream?: No Do you have to strain to urinate?: No Blood in urine?: Yes Urinary tract infection?: Yes Sexually transmitted disease?: No Injury to kidneys or bladder?: No Painful intercourse?: Yes Weak stream?: No Currently pregnant?: No Vaginal bleeding?: No Last menstrual period?: 03/2015  Gastrointestinal Nausea?: No Vomiting?: No Indigestion/heartburn?: No Diarrhea?: No Constipation?: Yes  Constitutional Fever: No Night sweats?: Yes Weight loss?: Yes Fatigue?: No  Skin Skin rash/lesions?: Yes Itching?: Yes  Eyes Blurred vision?: No Double vision?: No  Ears/Nose/Throat Sore throat?: No Sinus problems?: No  Hematologic/Lymphatic Swollen glands?: No Easy bruising?: No  Cardiovascular Leg swelling?: Yes Chest pain?: No  Respiratory Cough?: No Shortness  of breath?: No  Endocrine Excessive thirst?: Yes  Musculoskeletal Back pain?: Yes Joint pain?: No  Neurological Headaches?: No Dizziness?: No  Psychologic Depression?: No Anxiety?: No  Physical Exam: BP 122/86 mmHg  Pulse 8  Resp 16  Ht  (1.651 m)  Wt 156 lb 11.2 oz (71.079 kg)  BMI 26.08 kg/m2  Constitutional:  Alert and oriented, No acute distress. HEENT: [place] AT, moist mucus membranes.  Trachea midline, no masses. Cardiovascular: No clubbing, cyanosis, or edema. Respiratory: Normal  respiratory effort, no increased work of breathing. GI: Abdomen is soft, nontender, nondistended, no abdominal masses GU: No CVA tenderness.  Pelvic Exam: no vaginal bleeding, normal urinary meatus, no rashes, lesions or lacerations visible Skin: No rashes, bruises or suspicious lesions. Lymph: No cervical or inguinal adenopathy. Neurologic: Grossly intact, no focal deficits, moving all 4 extremities. Psychiatric: Normal mood and affect.  Laboratory Data:  Lab Results  Component Value Date   WBC 11.0* 01/02/2016   HGB 11.6* 01/02/2016   HCT 33.2* 01/02/2016   MCV 84.7 01/02/2016   PLT 232 01/02/2016    Lab Results  Component Value Date   CREATININE 0.79 05/14/2014    No results found for: PSA  No results found for: TESTOSTERONE  No results found for: HGBA1C  Urinalysis    Component Value Date/Time   COLORURINE Yellow 05/14/2014 1647   APPEARANCEUR Clear 05/14/2014 1647   LABSPEC 1.012 05/14/2014 1647   PHURINE 5.0 05/14/2014 1647   GLUCOSEU Negative 05/14/2014 1647   HGBUR 3+ 05/14/2014 1647   BILIRUBINUR negative 09/21/2015 1038   BILIRUBINUR Negative 05/14/2014 1647   KETONESUR Trace 05/14/2014 1647   PROTEINUR negative 09/21/2015 1038   PROTEINUR 30 mg/dL 81/19/1478 2956   UROBILINOGEN 0.2 09/21/2015 1038   NITRITE negative 09/21/2015 1038   NITRITE Negative 05/14/2014 1647   LEUKOCYTESUR large (3+)* 09/21/2015 1038   LEUKOCYTESUR Negative 05/14/2014 1647    Pertinent Imaging:   Assessment & Plan:    1. Gross Hematuria-  Suspect hemorrhagic cystitis.  Catheterized specimen with TNTC RBC. Unspun specimen suspicious for infection.  Patient to complete antibiotics as prescribed by Gyn and f/u in 2 weeks for a recheck.  - Urinalysis, Complete  Procedure: I&O catheterization performed using sterile technique to obtain urine specimen.   Catheter was inserted through the urethra and into the bladder without difficulty. Approximately 50ml of red urine was  drained. Patient tolerated procedure well.   Return in about 2 weeks (around 02/03/2016), or if symptoms worsen or fail to improve.  These notes generated with voice recognition software. I apologize for typographical errors.  Earlie Lou, FNP  [facility] [address] [place], [place] [address] [phone]

## 2016-01-21 LAB — URINALYSIS, COMPLETE

## 2016-01-21 LAB — MICROSCOPIC EXAMINATION
EPITHELIAL CELLS (NON RENAL): NONE SEEN /HPF (ref 0–10)
RBC, UA: >30 /hpf — AB (ref 0–?)
Renal Epithel, UA: NONE SEEN /hpf

## 2016-01-21 LAB — URINE CULTURE

## 2016-01-25 ENCOUNTER — Telehealth: Payer: Self-pay | Admitting: Obstetrics and Gynecology

## 2016-01-25 DIAGNOSIS — R310 Gross hematuria: Secondary | ICD-10-CM

## 2016-01-25 NOTE — Telephone Encounter (Signed)
Patient reports continued gross hematuria. I spoke with her today and we will proceed with a hematuria workup including CT urogram and follow-up cystoscopy. Please schedule her for an appointment with an M.D. for CT results and cystoscopy. I put in orders for the CT urogram.  Please cancel her follow-up appointment tomorrow morning.

## 2016-01-26 ENCOUNTER — Ambulatory Visit: Payer: BC Managed Care – PPO | Admitting: Obstetrics and Gynecology

## 2016-02-02 ENCOUNTER — Ambulatory Visit: Payer: BC Managed Care – PPO

## 2016-02-02 ENCOUNTER — Ambulatory Visit: Payer: BC Managed Care – PPO | Admitting: Obstetrics and Gynecology

## 2016-02-08 ENCOUNTER — Ambulatory Visit
Admission: RE | Admit: 2016-02-08 | Discharge: 2016-02-08 | Disposition: A | Discharge status: Home / Self Care | Payer: BC Managed Care – PPO | Source: Ambulatory Visit | Attending: Obstetrics and Gynecology | Admitting: Obstetrics and Gynecology

## 2016-02-08 DIAGNOSIS — N2889 Other specified disorders of kidney and ureter: Secondary | ICD-10-CM | POA: Diagnosis not present

## 2016-02-08 DIAGNOSIS — R310 Gross hematuria: Secondary | ICD-10-CM | POA: Insufficient documentation

## 2016-02-08 MED ORDER — IOHEXOL 300 MG/ML  SOLN
150.0000 mL | Freq: Once | INTRAMUSCULAR | Status: AC | PRN
  Administered 2016-02-08: 150 mL via INTRAVENOUS

## 2016-02-09 ENCOUNTER — Ambulatory Visit (INDEPENDENT_AMBULATORY_CARE_PROVIDER_SITE_OTHER): Payer: BC Managed Care – PPO | Admitting: Obstetrics & Gynecology

## 2016-02-09 ENCOUNTER — Encounter: Payer: Self-pay | Admitting: Obstetrics & Gynecology

## 2016-02-09 DIAGNOSIS — Z30011 Encounter for initial prescription of contraceptive pills: Secondary | ICD-10-CM

## 2016-02-09 NOTE — Progress Notes (Signed)
Patient ID: Danielle Esparza, female   DOB: 1981-06-27, 35 y.o.   MRN: [id number] Post Partum Exam  Danielle Esparza is a 35 y.o. G61P1001 female who presents for a postpartum visit. She is 5 weeks postpartum following a spontaneous vaginal delivery. I have fully reviewed the prenatal and intrapartum course. The delivery was at [redacted]w[redacted]d gestational weeks.  Anesthesia: epidural. Postpartum course has been positive for hematuria and hemorrhoids. Baby's course has been positive for trouble latching to breast but was [facility] lactation consultants [facility] and is now able to latch properly. Baby is feeding by breast. Bleeding no bleeding. Bowel function is abnormal: intermittant constipation. Bladder function is normal. Patient is not sexually active. Contraception method is oral progesterone-only contraceptive. Postpartum depression screening: Negative - Score=2  The following portions of the patient's history were reviewed and updated as appropriate: allergies, current medications, past family history, past medical history, past social history, past surgical history and problem list.  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 Needs repeat co-testing in one year (June 2016).  History of CIN I.  Repeat pap smear also normal with positive high-risk HPV on 05/26/15.  Negative HPV 16 and 18/45 testing; f/u in 1 year with co-testing.  Review of Systems Pertinent items noted in HPI and remainder of comprehensive ROS otherwise negative.   Objective:    BP 116/78 mmHg  Pulse 78  Resp 16  Ht  (1.651 m)  Wt 211 lb (95.709 kg)  BMI 35.11 kg/m2  Breastfeeding? Yes  General:  alert and no distress   Breasts:  inspection negative, no nipple discharge or bleeding, no masses or nodularity palpable  Lungs: clear to auscultation bilaterally  Heart:  regular rate and rhythm  Abdomen: soft, non-tender; bowel sounds normal; no masses,  no organomegaly   Vulva:  normal  Vagina: normal vagina, no  discharge, exudate, lesion, or erythema, well-healed laceration        Assessment:    Normal postpartum exam. Pap smear not done at today's visit.   Plan:   1. Contraception: oral progesterone-only contraceptive 2. Return after 05/25/16 for next pap smear or as needed.     Danielle Collins, MD, FACOG Attending Obstetrician & Gynecologist, [facility]

## 2016-02-12 NOTE — Telephone Encounter (Signed)
She stated to day that she wasn't sure about having the cysto done and would think about it and if she decided to she would call me back to schd it. I explained to her your reasoning for wanting to have it done and she said she understood them but was still undecided at this time.  Thanks,  michelle

## 2016-02-24 ENCOUNTER — Encounter: Payer: Self-pay | Admitting: Obstetrics and Gynecology

## 2016-02-24 ENCOUNTER — Telehealth: Payer: Self-pay | Admitting: Obstetrics and Gynecology

## 2016-02-24 NOTE — Telephone Encounter (Signed)
I will reach out to her again to see if she wants to follow up  [facility]

## 2016-02-24 NOTE — Telephone Encounter (Signed)
A letter was sent to the patient for her to call the office to schedule an appointment.  Danielle Esparza

## 2016-02-24 NOTE — Telephone Encounter (Signed)
This patient never returned to review her CT results.  Her last message states that she was unsure about having the cystoscopy.  She needs to be scheduled with one of the physicians to discuss her results and whether they recommend she still needs the cysto.

## 2016-03-01 ENCOUNTER — Telehealth: Payer: Self-pay

## 2016-03-01 NOTE — Telephone Encounter (Signed)
-----   Message from Harle BattiestShannon A McGowan, PA-C sent at 02/29/2016 10:14 PM EDT ----- We need to send this patient a certified letter regarding her need to follow-up.  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  Please ask her in the letter to call the office to schedule a cystoscopy appointment and to discuss her CT findings.

## 2016-03-01 NOTE — Telephone Encounter (Signed)
Certified letter sent. Tracking #- [id number]

## 2016-03-01 NOTE — Telephone Encounter (Signed)
See previous note

## 2016-03-04 ENCOUNTER — Telehealth: Payer: Self-pay

## 2016-03-04 ENCOUNTER — Other Ambulatory Visit: Payer: BC Managed Care – PPO

## 2016-03-04 DIAGNOSIS — R310 Gross hematuria: Secondary | ICD-10-CM

## 2016-03-04 NOTE — Telephone Encounter (Signed)
Pt was contacted in reference to cysto and CT for a hematuria work up. Pt stated at this time the gross hematuria previously stopped after abx but now has returned. Pt is 8wks post partum and has started having intercourse again. Pt also c/o lower back pain but denied n/v, f/c, dysuria, lower abd pain. Pt inquired about possibly needing another abx. Pt will drop off a urine this afternoon. Pt will see Dr. Apolinar JunesBrandon on 03/30/16 for cysto. Please advise.

## 2016-03-04 NOTE — Telephone Encounter (Signed)
Patient called back and schd her cysto and ct results for 03-30-16 with dr. Apolinar JunesBrandon   Thanks, michelle

## 2016-03-05 LAB — MICROSCOPIC EXAMINATION
RBC, UA: >30 /hpf — ABNORMAL HIGH (ref 0–?)
WBC, UA: NONE SEEN /hpf (ref 0–?)

## 2016-03-05 LAB — URINALYSIS, COMPLETE

## 2016-03-06 LAB — CULTURE, URINE COMPREHENSIVE

## 2016-03-07 NOTE — Telephone Encounter (Signed)
Patient notified, she states she is no longer burning but is still seeing some blood but she is much better than she was Friday. She was told then per Dr. Delana MeyerBrandon's note we would not treat at this time and keep her cysto apt. She is to call back if her symptoms worsen/SW

## 2016-03-07 NOTE — Telephone Encounter (Signed)
-----   Message from Vanna ScotlandAshley Brandon, MD sent at 03/06/2016  2:00 PM EDT ----- UCx grew only small amt mixed flora and UA without WBC.  No not suspect infection.  If complaining of burning and hematuria not improving, may consider treating anyway.  Let me know.    Vanna ScotlandAshley Brandon, MD

## 2016-03-30 ENCOUNTER — Encounter: Payer: Self-pay | Admitting: Urology

## 2016-03-30 ENCOUNTER — Telehealth: Payer: Self-pay | Admitting: Urology

## 2016-03-30 ENCOUNTER — Ambulatory Visit (INDEPENDENT_AMBULATORY_CARE_PROVIDER_SITE_OTHER): Payer: BC Managed Care – PPO | Admitting: Urology

## 2016-03-30 VITALS — BP 111/71 | HR 62 | Ht 65.0 in | Wt 152.3 lb

## 2016-03-30 DIAGNOSIS — R3129 Other microscopic hematuria: Secondary | ICD-10-CM | POA: Diagnosis not present

## 2016-03-30 DIAGNOSIS — N2889 Other specified disorders of kidney and ureter: Secondary | ICD-10-CM | POA: Diagnosis not present

## 2016-03-30 MED ORDER — CIPROFLOXACIN HCL 500 MG PO TABS
500.0000 mg | ORAL_TABLET | Freq: Once | ORAL | Status: AC
  Administered 2016-03-30: 500 mg via ORAL

## 2016-03-30 MED ORDER — LIDOCAINE HCL 2 % EX GEL
1.0000 "application " | Freq: Once | CUTANEOUS | Status: AC
  Administered 2016-03-30: 1 via URETHRAL

## 2016-03-30 NOTE — Progress Notes (Signed)
03/30/2016 2:24 PM   Danielle Esparza 06/24/1981 [id number]  Referring provider: Lorie PhenixNancy Maloney, MD [address] [place], [place] [id number]  Chief Complaint  Patient presents with  . Cysto    gross hematuria    HPI: 35 year old African-American female with intermittent gross hematuria.  At the time of her initial evaluation, she was newly postpartum but catheterized specimen did from the presence of gross blood. She is now 12 weeks postpartum. She has not had any further hematuria since last month.  Prior to this, she had no previous history of gross hematuria.  She presents for office cystoscopy today to complete her workup.  CT urogram did show a nonspecific 9 mm hypoattenuating lesion in the left upper pole which was not definitively characterized. A follow-up MRI was recommended. Unfortunately, we had difficulty contacting the patient had sent a registered letter.   PMH: Past Medical History  Diagnosis Date  . Allergy     seasonal  . Nipple discharge 2014  . Hemoglobin A-S genotype/sickle cell trait 05/28/2015    Surgical History: Past Surgical History  Procedure Laterality Date  . No past surgeries      Home Medications:    Medication List       This list is accurate as of: 03/30/16  2:24 PM.  Always use your most recent med list.               JOLIVETTE 0.35 MG tablet  Generic drug:  norethindrone  TK 1 T PO QD     prenatal vitamin w/FE, FA 29-1 MG Chew chewable tablet  Chew 1 tablet by mouth daily at 12 noon.        Allergies: No Known Allergies  Family History: Family History  Problem Relation Age of Onset  . Prostate cancer Father   . Sarcoidosis Father   . Breast cancer Paternal Grandmother   . Breast cancer Maternal Aunt [id number]    ovarian cancer at age 33    Social History:  reports that she has never smoked. She has never used smokeless tobacco. She reports that she does not drink alcohol or use illicit drugs.   Physical  Exam: BP 111/71 mmHg  Pulse 62  Ht 5\' 5"  (1.651 m)  Wt 152 lb 4.8 oz (69.083 kg)  BMI 25.34 kg/m2  Constitutional:  Alert and oriented, No acute distress. HEENT: [place] AT, moist mucus membranes.  Trachea midline, no masses. Cardiovascular: No clubbing, cyanosis, or edema. Respiratory: Normal respiratory effort, no increased work of breathing. GI: Abdomen is soft, nontender, nondistended, no abdominal masses GU: No CVA tenderness. Normal external genitalia and urethral meatus. Skin: No rashes, bruises or suspicious lesions. Neurologic: Grossly intact, no focal deficits, moving all 4 extremities. Psychiatric: Normal mood and affect.  Laboratory Data: Lab Results  Component Value Date   WBC 11.0* 01/02/2016   HGB 11.6* 01/02/2016   HCT 33.2* 01/02/2016   MCV 84.7 01/02/2016   PLT 232 01/02/2016    Lab Results  Component Value Date   CREATININE 0.79 05/14/2014     Urinalysis Results for orders placed or performed in visit on 03/30/16  Microscopic Examination  Result Value Ref Range   WBC, UA 0-5 0 -  5 /hpf   RBC, UA 0-2 0 -  2 /hpf   Epithelial Cells (non renal) 0-10 0 - 10 /hpf   Bacteria, UA None seen None seen/Few  Urinalysis, Complete  Result Value Ref Range   Specific Gravity, UA 1.015 1.005 -  1.030   pH, UA 7.5 5.0 - 7.5   Color, UA Yellow Yellow   Appearance Ur Clear Clear   Leukocytes, UA Negative Negative   Protein, UA Negative Negative/Trace   Glucose, UA Negative Negative   Ketones, UA Negative Negative   RBC, UA Negative Negative   Bilirubin, UA Negative Negative   Urobilinogen, Ur 0.2 0.2 - 1.0 mg/dL   Nitrite, UA Negative Negative   Microscopic Examination See below:     Pertinent Imaging:  Study Result     CLINICAL DATA: Hematuria  EXAM: CT ABDOMEN AND PELVIS WITHOUT AND WITH CONTRAST  TECHNIQUE: Multidetector CT imaging of the abdomen and pelvis was performed following the standard protocol before and following the bolus administration  of intravenous contrast.  CONTRAST: OMNIPAQUE IOHEXOL 300 MG/ML SOLN  COMPARISON: None.  FINDINGS: Lower chest: Unremarkable.  Hepatobiliary: Small area of low attenuation in the anterior liver, adjacent to the falciform ligament, is in a characteristic location for focal fatty change. Liver parenchyma otherwise unremarkable. There is no evidence for gallstones, gallbladder wall thickening, or pericholecystic fluid. No intrahepatic or extrahepatic biliary dilation.  Pancreas: No focal mass lesion. No dilatation of the main duct. No intraparenchymal cyst. No peripancreatic edema.  Spleen: No splenomegaly. No focal mass lesion.  Adrenals/Urinary Tract: No adrenal nodule or mass. No renal, ureteral, or bladder stones. Imaging after IV contrast administration shows no enhancing lesion in either kidney. A small 9 mm nonspecific hypo attenuating lesion is identified in the upper pole of the left kidney. Duplicated left intrarenal collecting system noted with no filling defects in the collecting system of either kidney. The renal pelvis of each kidney has normal imaging features. Right ureter is well opacified and is normal in appearance throughout. Distal 1/3 to 1/2 of the left ureter is opacified, but no left ureteral abnormality is evident. No focal bladder wall abnormality on delayed imaging.  Stomach/Bowel: Stomach is nondistended. No gastric wall thickening. No evidence of outlet obstruction. Duodenum is normally positioned as is the ligament of Treitz. No small bowel wall thickening. No small bowel dilatation. The terminal ileum is normal. The appendix is normal. No gross colonic mass. No colonic wall thickening. No substantial diverticular change.  Vascular/Lymphatic: No abdominal aortic aneurysm. No abdominal atherosclerotic calcification. There is no gastrohepatic or hepatoduodenal ligament lymphadenopathy. No intraperitoneal or retroperitoneal  lymphadenopathy. No pelvic sidewall lymphadenopathy.  Reproductive: The uterus has normal CT imaging appearance. There is no adnexal mass.  Other: No intraperitoneal free fluid.  Musculoskeletal: Bone windows reveal no worrisome lytic or sclerotic osseous lesions.  IMPRESSION: 1. No urinary stones. No definite findings to explain the patient's history of hematuria. There is a 9 mm nonspecific hypo attenuating lesion in the upper pole of the left kidney which cannot be definitively characterized on today's study. Given that this has attenuation higher than would be expected for a simple cyst and the patient has a history of hematuria, followup MRI without and with contrast is recommended to exclude neoplasm such as papillary renal cell carcinoma.   Electronically Signed  By: Kennith Center M.D.  On: 02/08/2016 10:11     CT scan personally reviewed today and with the patient.  Cystoscopy Procedure Note  Patient identification was confirmed, informed consent was obtained, and patient was prepped using Betadine solution.  Lidocaine jelly was administered per urethral meatus.    Preoperative abx where received prior to procedure.    Procedure: - Flexible cystoscope introduced, without any difficulty.   - Thorough search  of the bladder revealed:    normal urethral meatus    normal urothelium    no stones    no ulcers     no tumors    no urethral polyps    no trabeculation  - Ureteral orifices were normal in position and appearance.  Post-Procedure: - Patient tolerated the procedure well  Assessment & Plan:    1. Microscopic hematuria Cystoscopy today negative. Incidental 9 mm incompletely characterized renal mass on CT urogram. This is not likely source of her bleeding. Suspect hemorrhagic cystitis  which is now resolved. - Urinalysis, Complete - lidocaine (XYLOCAINE) 2 % jelly 1 application; Place 1 application into the urethra once. - ciprofloxacin (CIPRO)  tablet 500 mg; Take 1 tablet (500 mg total) by mouth once. - MR Abdomen W Wo Contrast; Future  2. Renal mass 9 mm left upper pole possible renal mass which is incompletely characterized on CT urogram. Findings were reviewed today with the patient. I've recommended abdominal MR with an without contrast for further evaluation. She is agreeable with this plan. -MRI  We'll call her with these results to discuss. Further workup/treatment pending the above study.  Vanna Scotland, MD  [facility] [address], [address] [place], [place] [address] [phone]

## 2016-03-30 NOTE — Telephone Encounter (Signed)
Just a reminder I will need a new order for her MRI that says renal mass for the diagnosis. I have already gotten it approved with her insurance company. So once i get that i can go ahead and release it for them to call her to schedule it.   Thanks,  michelle

## 2016-03-31 ENCOUNTER — Encounter: Payer: Self-pay | Admitting: Urology

## 2016-03-31 LAB — URINALYSIS, COMPLETE
Bilirubin, UA: NEGATIVE
Glucose, UA: NEGATIVE
KETONES UA: NEGATIVE
LEUKOCYTES UA: NEGATIVE
Nitrite, UA: NEGATIVE
Protein, UA: NEGATIVE
RBC, UA: NEGATIVE
SPEC GRAV UA: 1.015 (ref 1.005–1.030)
Urobilinogen, Ur: 0.2 mg/dL (ref 0.2–1.0)
pH, UA: 7.5 (ref 5.0–7.5)

## 2016-03-31 LAB — MICROSCOPIC EXAMINATION: Bacteria, UA: NONE SEEN

## 2016-04-27 ENCOUNTER — Ambulatory Visit
Admission: RE | Admit: 2016-04-27 | Discharge: 2016-04-27 | Disposition: A | Discharge status: Home / Self Care | Payer: BC Managed Care – PPO | Source: Ambulatory Visit | Attending: Urology | Admitting: Urology

## 2016-04-27 DIAGNOSIS — N2889 Other specified disorders of kidney and ureter: Secondary | ICD-10-CM

## 2016-04-27 MED ORDER — GADOBENATE DIMEGLUMINE 529 MG/ML IV SOLN
15.0000 mL | Freq: Once | INTRAVENOUS | Status: AC | PRN
  Administered 2016-04-27: 15 mL via INTRAVENOUS

## 2016-05-03 ENCOUNTER — Telehealth: Payer: Self-pay | Admitting: Urology

## 2016-05-03 NOTE — Telephone Encounter (Signed)
Pt called and wants to know if she is able to see her MRI results on MyChart if she needs to come in for MRI results appt tomorrow at 2:15.  Pt's cell# is [phone].  Please advise.

## 2016-05-03 NOTE — Telephone Encounter (Signed)
Reviewed MRI results with patient. Renal mass has completely resolved. All of her questions were answered. No need for follow-up.  Vanna ScotlandAshley Kacyn Souder, MD

## 2016-05-04 ENCOUNTER — Ambulatory Visit: Payer: BC Managed Care – PPO | Admitting: Urology

## 2016-05-27 ENCOUNTER — Ambulatory Visit (INDEPENDENT_AMBULATORY_CARE_PROVIDER_SITE_OTHER): Payer: BC Managed Care – PPO | Admitting: Family Medicine

## 2016-05-27 ENCOUNTER — Encounter: Payer: Self-pay | Admitting: Family Medicine

## 2016-05-27 ENCOUNTER — Other Ambulatory Visit: Payer: Self-pay | Admitting: Family Medicine

## 2016-05-27 VITALS — BP 112/71 | HR 67 | Resp 18 | Ht 64.75 in | Wt 147.0 lb

## 2016-05-27 DIAGNOSIS — Z3403 Encounter for supervision of normal first pregnancy, third trimester: Secondary | ICD-10-CM

## 2016-05-27 DIAGNOSIS — Z01419 Encounter for gynecological examination (general) (routine) without abnormal findings: Secondary | ICD-10-CM | POA: Diagnosis not present

## 2016-05-27 DIAGNOSIS — Z124 Encounter for screening for malignant neoplasm of cervix: Secondary | ICD-10-CM

## 2016-05-27 DIAGNOSIS — Z1151 Encounter for screening for human papillomavirus (HPV): Secondary | ICD-10-CM | POA: Diagnosis not present

## 2016-05-27 DIAGNOSIS — R87810 Cervical high risk human papillomavirus (HPV) DNA test positive: Secondary | ICD-10-CM

## 2016-05-27 LAB — LIPID PANEL
Cholesterol: 132 mg/dL (ref 125–200)
HDL: 72 mg/dL (ref >=46)
LDL CALC: 54 mg/dL (ref <130)
Total CHOL/HDL Ratio: 1.8 Ratio (ref <=5.0)
Triglycerides: 29 mg/dL (ref <150)
VLDL: 6 mg/dL (ref <30)

## 2016-05-27 LAB — COMPREHENSIVE METABOLIC PANEL
ALBUMIN: 4.2 g/dL (ref 3.6–5.1)
ALT: 18 U/L (ref 6–29)
AST: 21 U/L (ref 10–30)
Alkaline Phosphatase: 68 U/L (ref 33–115)
BILIRUBIN TOTAL: 0.6 mg/dL (ref 0.2–1.2)
BUN: 8 mg/dL (ref 7–25)
CO2: 24 mmol/L (ref 20–31)
CREATININE: 0.82 mg/dL (ref 0.50–1.10)
Calcium: 9.3 mg/dL (ref 8.6–10.2)
Chloride: 108 mmol/L (ref 98–110)
GLUCOSE: 70 mg/dL (ref 65–99)
Potassium: 4 mmol/L (ref 3.5–5.3)
SODIUM: 140 mmol/L (ref 135–146)
Total Protein: 7.1 g/dL (ref 6.1–8.1)

## 2016-05-27 LAB — CBC
HCT: 34.3 % — ABNORMAL LOW (ref 35.0–45.0)
Hemoglobin: 11.2 g/dL — ABNORMAL LOW (ref 11.7–15.5)
MCH: 28.1 pg (ref 27.0–33.0)
MCHC: 32.7 g/dL (ref 32.0–36.0)
MCV: 86 fL (ref 80.0–100.0)
MPV: 9.7 fL (ref 7.5–12.5)
Platelets: 221 10*3/uL (ref 140–400)
RBC: 3.99 MIL/uL (ref 3.80–5.10)
RDW: 14.1 % (ref 11.0–15.0)
WBC: 2.8 10*3/uL — AB (ref 3.8–10.8)

## 2016-05-27 LAB — TSH: TSH: 0.3 m[IU]/L — AB

## 2016-05-27 NOTE — Patient Instructions (Signed)
Preventive Care for Adults, Female A healthy lifestyle and preventive care can promote health and wellness. Preventive health guidelines for women include the following key practices.  A routine yearly physical is a good way to check with your health care provider about your health and preventive screening. It is a chance to share any concerns and updates on your health and to receive a thorough exam.  Visit your dentist for a routine exam and preventive care every 6 months. Brush your teeth twice a day and floss once a day. Good oral hygiene prevents tooth decay and gum disease.  The frequency of eye exams is based on your age, health, family medical history, use of contact lenses, and other factors. Follow your health care provider's recommendations for frequency of eye exams.  Eat a healthy diet. Foods like vegetables, fruits, whole grains, low-fat dairy products, and lean protein foods contain the nutrients you need without too many calories. Decrease your intake of foods high in solid fats, added sugars, and salt. Eat the right amount of calories for you.Get information about a proper diet from your health care provider, if necessary.  Regular physical exercise is one of the most important things you can do for your health. Most adults should get at least 150 minutes of moderate-intensity exercise (any activity that increases your heart rate and causes you to sweat) each week. In addition, most adults need muscle-strengthening exercises on 2 or more days a week.  Maintain a healthy weight. The body mass index (BMI) is a screening tool to identify possible weight problems. It provides an estimate of body fat based on height and weight. Your health care provider can find your BMI and can help you achieve or maintain a healthy weight.For adults 20 years and older:  A BMI below 18.5 is considered underweight.  A BMI of 18.5 to 24.9 is normal.  A BMI of 25 to 29.9 is considered overweight.  A  BMI of 30 and above is considered obese.  Maintain normal blood lipids and cholesterol levels by exercising and minimizing your intake of saturated fat. Eat a balanced diet with plenty of fruit and vegetables. Blood tests for lipids and cholesterol should begin at age 45 and be repeated every 5 years. If your lipid or cholesterol levels are high, you are over 50, or you are at high risk for heart disease, you may need your cholesterol levels checked more frequently.Ongoing high lipid and cholesterol levels should be treated with medicines if diet and exercise are not working.  If you smoke, find out from your health care provider how to quit. If you do not use tobacco, do not start.  Lung cancer screening is recommended for adults aged 45-80 years who are at high risk for developing lung cancer because of a history of smoking. A yearly low-dose CT scan of the lungs is recommended for people who have at least a 30-pack-year history of smoking and are a current smoker or have quit within the past 15 years. A pack year of smoking is smoking an average of 1 pack of cigarettes a day for 1 year (for example: 1 pack a day for 30 years or 2 packs a day for 15 years). Yearly screening should continue until the smoker has stopped smoking for at least 15 years. Yearly screening should be stopped for people who develop a health problem that would prevent them from having lung cancer treatment.  If you are pregnant, do not drink alcohol. If you are  breastfeeding, be very cautious about drinking alcohol. If you are not pregnant and choose to drink alcohol, do not have more than 1 drink per day. One drink is considered to be 12 ounces (355 mL) of beer, 5 ounces (148 mL) of wine, or 1.5 ounces (44 mL) of liquor.  Avoid use of street drugs. Do not share needles with anyone. Ask for help if you need support or instructions about stopping the use of drugs.  High blood pressure causes heart disease and increases the risk  of stroke. Your blood pressure should be checked at least every 1 to 2 years. Ongoing high blood pressure should be treated with medicines if weight loss and exercise do not work.  If you are 55-79 years old, ask your health care provider if you should take aspirin to prevent strokes.  Diabetes screening is done by taking a blood sample to check your blood glucose level after you have not eaten for a certain period of time (fasting). If you are not overweight and you do not have risk factors for diabetes, you should be screened once every 3 years starting at age 45. If you are overweight or obese and you are 40-70 years of age, you should be screened for diabetes every year as part of your cardiovascular risk assessment.  Breast cancer screening is essential preventive care for women. You should practice "breast self-awareness." This means understanding the normal appearance and feel of your breasts and may include breast self-examination. Any changes detected, no matter how small, should be reported to a health care provider. Women in their 20s and 30s should have a clinical breast exam (CBE) by a health care provider as part of a regular health exam every 1 to 3 years. After age 40, women should have a CBE every year. Starting at age 40, women should consider having a mammogram (breast X-ray test) every year. Women who have a family history of breast cancer should talk to their health care provider about genetic screening. Women at a high risk of breast cancer should talk to their health care providers about having an MRI and a mammogram every year.  Breast cancer gene (BRCA)-related cancer risk assessment is recommended for women who have family members with BRCA-related cancers. BRCA-related cancers include breast, ovarian, tubal, and peritoneal cancers. Having family members with these cancers may be associated with an increased risk for harmful changes (mutations) in the breast cancer genes BRCA1 and  BRCA2. Results of the assessment will determine the need for genetic counseling and BRCA1 and BRCA2 testing.  Your health care provider may recommend that you be screened regularly for cancer of the pelvic organs (ovaries, uterus, and vagina). This screening involves a pelvic examination, including checking for microscopic changes to the surface of your cervix (Pap test). You may be encouraged to have this screening done every 3 years, beginning at age 21.  For women ages 30-65, health care providers may recommend pelvic exams and Pap testing every 3 years, or they may recommend the Pap and pelvic exam, combined with testing for human papilloma virus (HPV), every 5 years. Some types of HPV increase your risk of cervical cancer. Testing for HPV may also be done on women of any age with unclear Pap test results.  Other health care providers may not recommend any screening for nonpregnant women who are considered low risk for pelvic cancer and who do not have symptoms. Ask your health care provider if a screening pelvic exam is right for   you.  If you have had past treatment for cervical cancer or a condition that could lead to cancer, you need Pap tests and screening for cancer for at least 20 years after your treatment. If Pap tests have been discontinued, your risk factors (such as having a new sexual partner) need to be reassessed to determine if screening should resume. Some women have medical problems that increase the chance of getting cervical cancer. In these cases, your health care provider may recommend more frequent screening and Pap tests.  Colorectal cancer can be detected and often prevented. Most routine colorectal cancer screening begins at the age of 50 years and continues through age 75 years. However, your health care provider may recommend screening at an earlier age if you have risk factors for colon cancer. On a yearly basis, your health care provider may provide home test kits to check  for hidden blood in the stool. Use of a small camera at the end of a tube, to directly examine the colon (sigmoidoscopy or colonoscopy), can detect the earliest forms of colorectal cancer. Talk to your health care provider about this at age 50, when routine screening begins. Direct exam of the colon should be repeated every 5-10 years through age 75 years, unless early forms of precancerous polyps or small growths are found.  People who are at an increased risk for hepatitis B should be screened for this virus. You are considered at high risk for hepatitis B if:  You were born in a country where hepatitis B occurs often. Talk with your health care provider about which countries are considered high risk.  Your parents were born in a high-risk country and you have not received a shot to protect against hepatitis B (hepatitis B vaccine).  You have HIV or AIDS.  You use needles to inject street drugs.  You live with, or have sex with, someone who has hepatitis B.  You get hemodialysis treatment.  You take certain medicines for conditions like cancer, organ transplantation, and autoimmune conditions.  Hepatitis C blood testing is recommended for all people born from 1945 through 1965 and any individual with known risks for hepatitis C.  Practice safe sex. Use condoms and avoid high-risk sexual practices to reduce the spread of sexually transmitted infections (STIs). STIs include gonorrhea, chlamydia, syphilis, trichomonas, herpes, HPV, and human immunodeficiency virus (HIV). Herpes, HIV, and HPV are viral illnesses that have no cure. They can result in disability, cancer, and death.  You should be screened for sexually transmitted illnesses (STIs) including gonorrhea and chlamydia if:  You are sexually active and are younger than 24 years.  You are older than 24 years and your health care provider tells you that you are at risk for this type of infection.  Your sexual activity has changed  since you were last screened and you are at an increased risk for chlamydia or gonorrhea. Ask your health care provider if you are at risk.  If you are at risk of being infected with HIV, it is recommended that you take a prescription medicine daily to prevent HIV infection. This is called preexposure prophylaxis (PrEP). You are considered at risk if:  You are sexually active and do not regularly use condoms or know the HIV status of your partner(s).  You take drugs by injection.  You are sexually active with a partner who has HIV.  Talk with your health care provider about whether you are at high risk of being infected with HIV. If   you choose to begin PrEP, you should first be tested for HIV. You should then be tested every 3 months for as long as you are taking PrEP.  Osteoporosis is a disease in which the bones lose minerals and strength with aging. This can result in serious bone fractures or breaks. The risk of osteoporosis can be identified using a bone density scan. Women ages 67 years and over and women at risk for fractures or osteoporosis should discuss screening with their health care providers. Ask your health care provider whether you should take a calcium supplement or vitamin D to reduce the rate of osteoporosis.  Menopause can be associated with physical symptoms and risks. Hormone replacement therapy is available to decrease symptoms and risks. You should talk to your health care provider about whether hormone replacement therapy is right for you.  Use sunscreen. Apply sunscreen liberally and repeatedly throughout the day. You should seek shade when your shadow is shorter than you. Protect yourself by wearing long sleeves, pants, a wide-brimmed hat, and sunglasses year round, whenever you are outdoors.  Once a month, do a whole body skin exam, using a mirror to look at the skin on your back. Tell your health care provider of new moles, moles that have irregular borders, moles that  are larger than a pencil eraser, or moles that have changed in shape or color.  Stay current with required vaccines (immunizations).  Influenza vaccine. All adults should be immunized every year.  Tetanus, diphtheria, and acellular pertussis (Td, Tdap) vaccine. Pregnant women should receive 1 dose of Tdap vaccine during each pregnancy. The dose should be obtained regardless of the length of time since the last dose. Immunization is preferred during the 27th-36th week of gestation. An adult who has not previously received Tdap or who does not know her vaccine status should receive 1 dose of Tdap. This initial dose should be followed by tetanus and diphtheria toxoids (Td) booster doses every 10 years. Adults with an unknown or incomplete history of completing a 3-dose immunization series with Td-containing vaccines should begin or complete a primary immunization series including a Tdap dose. Adults should receive a Td booster every 10 years.  Varicella vaccine. An adult without evidence of immunity to varicella should receive 2 doses or a second dose if she has previously received 1 dose. Pregnant females who do not have evidence of immunity should receive the first dose after pregnancy. This first dose should be obtained before leaving the health care facility. The second dose should be obtained 4-8 weeks after the first dose.  Human papillomavirus (HPV) vaccine. Females aged 13-26 years who have not received the vaccine previously should obtain the 3-dose series. The vaccine is not recommended for use in pregnant females. However, pregnancy testing is not needed before receiving a dose. If a female is found to be pregnant after receiving a dose, no treatment is needed. In that case, the remaining doses should be delayed until after the pregnancy. Immunization is recommended for any person with an immunocompromised condition through the age of 61 years if she did not get any or all doses earlier. During the  3-dose series, the second dose should be obtained 4-8 weeks after the first dose. The third dose should be obtained 24 weeks after the first dose and 16 weeks after the second dose.  Zoster vaccine. One dose is recommended for adults aged 30 years or older unless certain conditions are present.  Measles, mumps, and rubella (MMR) vaccine. Adults born  before 1957 generally are considered immune to measles and mumps. Adults born in 1957 or later should have 1 or more doses of MMR vaccine unless there is a contraindication to the vaccine or there is laboratory evidence of immunity to each of the three diseases. A routine second dose of MMR vaccine should be obtained at least 28 days after the first dose for students attending postsecondary schools, health care workers, or international travelers. People who received inactivated measles vaccine or an unknown type of measles vaccine during 1963-1967 should receive 2 doses of MMR vaccine. People who received inactivated mumps vaccine or an unknown type of mumps vaccine before 1979 and are at high risk for mumps infection should consider immunization with 2 doses of MMR vaccine. For females of childbearing age, rubella immunity should be determined. If there is no evidence of immunity, females who are not pregnant should be vaccinated. If there is no evidence of immunity, females who are pregnant should delay immunization until after pregnancy. Unvaccinated health care workers born before 1957 who lack laboratory evidence of measles, mumps, or rubella immunity or laboratory confirmation of disease should consider measles and mumps immunization with 2 doses of MMR vaccine or rubella immunization with 1 dose of MMR vaccine.  Pneumococcal 13-valent conjugate (PCV13) vaccine. When indicated, a person who is uncertain of his immunization history and has no record of immunization should receive the PCV13 vaccine. All adults 65 years of age and older should receive this  vaccine. An adult aged 19 years or older who has certain medical conditions and has not been previously immunized should receive 1 dose of PCV13 vaccine. This PCV13 should be followed with a dose of pneumococcal polysaccharide (PPSV23) vaccine. Adults who are at high risk for pneumococcal disease should obtain the PPSV23 vaccine at least 8 weeks after the dose of PCV13 vaccine. Adults older than 33 years of age who have normal immune system function should obtain the PPSV23 vaccine dose at least 1 year after the dose of PCV13 vaccine.  Pneumococcal polysaccharide (PPSV23) vaccine. When PCV13 is also indicated, PCV13 should be obtained first. All adults aged 65 years and older should be immunized. An adult younger than age 65 years who has certain medical conditions should be immunized. Any person who resides in a nursing home or long-term care facility should be immunized. An adult smoker should be immunized. People with an immunocompromised condition and certain other conditions should receive both PCV13 and PPSV23 vaccines. People with human immunodeficiency virus (HIV) infection should be immunized as soon as possible after diagnosis. Immunization during chemotherapy or radiation therapy should be avoided. Routine use of PPSV23 vaccine is not recommended for American Indians, [place] Natives, or people younger than 65 years unless there are medical conditions that require PPSV23 vaccine. When indicated, people who have unknown immunization and have no record of immunization should receive PPSV23 vaccine. One-time revaccination 5 years after the first dose of PPSV23 is recommended for people aged 19-64 years who have chronic kidney failure, nephrotic syndrome, asplenia, or immunocompromised conditions. People who received 1-2 doses of PPSV23 before age 65 years should receive another dose of PPSV23 vaccine at age 65 years or later if at least 5 years have passed since the previous dose. Doses of PPSV23 are not  needed for people immunized with PPSV23 at or after age 65 years.  Meningococcal vaccine. Adults with asplenia or persistent complement component deficiencies should receive 2 doses of quadrivalent meningococcal conjugate (MenACWY-D) vaccine. The doses should be obtained   at least 2 months apart. Microbiologists working with certain meningococcal bacteria, [place] recruits, people at risk during an outbreak, and people who travel to or live in countries with a high rate of meningitis should be immunized. A first-year college student up through age 34 years who is living in a residence hall should receive a dose if she did not receive a dose on or after her 16th birthday. Adults who have certain high-risk conditions should receive one or more doses of vaccine.  Hepatitis A vaccine. Adults who wish to be protected from this disease, have certain high-risk conditions, work with hepatitis A-infected animals, work in hepatitis A research labs, or travel to or work in countries with a high rate of hepatitis A should be immunized. Adults who were previously unvaccinated and who anticipate close contact with an international adoptee during the first 60 days after arrival in the [place] from a country with a high rate of hepatitis A should be immunized.  Hepatitis B vaccine. Adults who wish to be protected from this disease, have certain high-risk conditions, may be exposed to blood or other infectious body fluids, are household contacts or sex partners of hepatitis B positive people, are clients or workers in certain care facilities, or travel to or work in countries with a high rate of hepatitis B should be immunized.  Haemophilus influenzae type b (Hib) vaccine. A previously unvaccinated person with asplenia or sickle cell disease or having a scheduled splenectomy should receive 1 dose of Hib vaccine. Regardless of previous immunization, a recipient of a hematopoietic stem cell transplant should receive a  3-dose series 6-12 months after her successful transplant. Hib vaccine is not recommended for adults with HIV infection. Preventive Services / Frequency Ages 35 to 4 years  Blood pressure check.** / Every 3-5 years.  Lipid and cholesterol check.** / Every 5 years beginning at age 60.  Clinical breast exam.** / Every 3 years for women in their 71s and 10s.  BRCA-related cancer risk assessment.** / For women who have family members with a BRCA-related cancer (breast, ovarian, tubal, or peritoneal cancers).  Pap test.** / Every 2 years from ages 76 through 26. Every 3 years starting at age 61 through age 76 or 93 with a history of 3 consecutive normal Pap tests.  HPV screening.** / Every 3 years from ages 37 through ages 60 to 51 with a history of 3 consecutive normal Pap tests.  Hepatitis C blood test.** / For any individual with known risks for hepatitis C.  Skin self-exam. / Monthly.  Influenza vaccine. / Every year.  Tetanus, diphtheria, and acellular pertussis (Tdap, Td) vaccine.** / Consult your health care provider. Pregnant women should receive 1 dose of Tdap vaccine during each pregnancy. 1 dose of Td every 10 years.  Varicella vaccine.** / Consult your health care provider. Pregnant females who do not have evidence of immunity should receive the first dose after pregnancy.  HPV vaccine. / 3 doses over 6 months, if 93 and younger. The vaccine is not recommended for use in pregnant females. However, pregnancy testing is not needed before receiving a dose.  Measles, mumps, rubella (MMR) vaccine.** / You need at least 1 dose of MMR if you were born in 1957 or later. You may also need a 2nd dose. For females of childbearing age, rubella immunity should be determined. If there is no evidence of immunity, females who are not pregnant should be vaccinated. If there is no evidence of immunity, females who are  pregnant should delay immunization until after pregnancy.  Pneumococcal  13-valent conjugate (PCV13) vaccine.** / Consult your health care provider.  Pneumococcal polysaccharide (PPSV23) vaccine.** / 1 to 2 doses if you smoke cigarettes or if you have certain conditions.  Meningococcal vaccine.** / 1 dose if you are age 68 to 8 years and a Market researcher living in a residence hall, or have one of several medical conditions, you need to get vaccinated against meningococcal disease. You may also need additional booster doses.  Hepatitis A vaccine.** / Consult your health care provider.  Hepatitis B vaccine.** / Consult your health care provider.  Haemophilus influenzae type b (Hib) vaccine.** / Consult your health care provider. Ages 7 to 53 years  Blood pressure check.** / Every year.  Lipid and cholesterol check.** / Every 5 years beginning at age 25 years.  Lung cancer screening. / Every year if you are aged 11-80 years and have a 30-pack-year history of smoking and currently smoke or have quit within the past 15 years. Yearly screening is stopped once you have quit smoking for at least 15 years or develop a health problem that would prevent you from having lung cancer treatment.  Clinical breast exam.** / Every year after age 48 years.  BRCA-related cancer risk assessment.** / For women who have family members with a BRCA-related cancer (breast, ovarian, tubal, or peritoneal cancers).  Mammogram.** / Every year beginning at age 41 years and continuing for as long as you are in good health. Consult with your health care provider.  Pap test.** / Every 3 years starting at age 65 years through age 37 or 70 years with a history of 3 consecutive normal Pap tests.  HPV screening.** / Every 3 years from ages 72 years through ages 60 to 40 years with a history of 3 consecutive normal Pap tests.  Fecal occult blood test (FOBT) of stool. / Every year beginning at age 21 years and continuing until age 5 years. You may not need to do this test if you get  a colonoscopy every 10 years.  Flexible sigmoidoscopy or colonoscopy.** / Every 5 years for a flexible sigmoidoscopy or every 10 years for a colonoscopy beginning at age 35 years and continuing until age 48 years.  Hepatitis C blood test.** / For all people born from [birth year] through 1965 and any individual with known risks for hepatitis C.  Skin self-exam. / Monthly.  Influenza vaccine. / Every year.  Tetanus, diphtheria, and acellular pertussis (Tdap/Td) vaccine.** / Consult your health care provider. Pregnant women should receive 1 dose of Tdap vaccine during each pregnancy. 1 dose of Td every 10 years.  Varicella vaccine.** / Consult your health care provider. Pregnant females who do not have evidence of immunity should receive the first dose after pregnancy.  Zoster vaccine.** / 1 dose for adults aged 30 years or older.  Measles, mumps, rubella (MMR) vaccine.** / You need at least 1 dose of MMR if you were born in 1957 or later. You may also need a second dose. For females of childbearing age, rubella immunity should be determined. If there is no evidence of immunity, females who are not pregnant should be vaccinated. If there is no evidence of immunity, females who are pregnant should delay immunization until after pregnancy.  Pneumococcal 13-valent conjugate (PCV13) vaccine.** / Consult your health care provider.  Pneumococcal polysaccharide (PPSV23) vaccine.** / 1 to 2 doses if you smoke cigarettes or if you have certain conditions.  Meningococcal vaccine.** /  Consult your health care provider.  Hepatitis A vaccine.** / Consult your health care provider.  Hepatitis B vaccine.** / Consult your health care provider.  Haemophilus influenzae type b (Hib) vaccine.** / Consult your health care provider. Ages 64 years and over  Blood pressure check.** / Every year.  Lipid and cholesterol check.** / Every 5 years beginning at age 23 years.  Lung cancer screening. / Every year if you  are aged 16-80 years and have a 30-pack-year history of smoking and currently smoke or have quit within the past 15 years. Yearly screening is stopped once you have quit smoking for at least 15 years or develop a health problem that would prevent you from having lung cancer treatment.  Clinical breast exam.** / Every year after age 74 years.  BRCA-related cancer risk assessment.** / For women who have family members with a BRCA-related cancer (breast, ovarian, tubal, or peritoneal cancers).  Mammogram.** / Every year beginning at age 44 years and continuing for as long as you are in good health. Consult with your health care provider.  Pap test.** / Every 3 years starting at age 58 years through age 22 or 39 years with 3 consecutive normal Pap tests. Testing can be stopped between 65 and 70 years with 3 consecutive normal Pap tests and no abnormal Pap or HPV tests in the past 10 years.  HPV screening.** / Every 3 years from ages 64 years through ages 70 or 61 years with a history of 3 consecutive normal Pap tests. Testing can be stopped between 65 and 70 years with 3 consecutive normal Pap tests and no abnormal Pap or HPV tests in the past 10 years.  Fecal occult blood test (FOBT) of stool. / Every year beginning at age 40 years and continuing until age 27 years. You may not need to do this test if you get a colonoscopy every 10 years.  Flexible sigmoidoscopy or colonoscopy.** / Every 5 years for a flexible sigmoidoscopy or every 10 years for a colonoscopy beginning at age 7 years and continuing until age 32 years.  Hepatitis C blood test.** / For all people born from [birth year] through 1963 and any individual with known risks for hepatitis C.  Osteoporosis screening.** / A one-time screening for women ages 30 years and over and women at risk for fractures or osteoporosis.  Skin self-exam. / Monthly.  Influenza vaccine. / Every year.  Tetanus, diphtheria, and acellular pertussis (Tdap/Td)  vaccine.** / 1 dose of Td every 10 years.  Varicella vaccine.** / Consult your health care provider.  Zoster vaccine.** / 1 dose for adults aged 35 years or older.  Pneumococcal 13-valent conjugate (PCV13) vaccine.** / Consult your health care provider.  Pneumococcal polysaccharide (PPSV23) vaccine.** / 1 dose for all adults aged 46 years and older.  Meningococcal vaccine.** / Consult your health care provider.  Hepatitis A vaccine.** / Consult your health care provider.  Hepatitis B vaccine.** / Consult your health care provider.  Haemophilus influenzae type b (Hib) vaccine.** / Consult your health care provider. ** Family history and personal history of risk and conditions may change your health care provider's recommendations.   This information is not intended to replace advice given to you by your health care provider. Make sure you discuss any questions you have with your health care provider.   Document Released: 01/24/2002 Document Revised: 12/19/2014 Document Reviewed: 04/25/2011 Elsevier Interactive Patient Education [employer].

## 2016-05-27 NOTE — Progress Notes (Signed)
Pt here today, c/o occasional right lower abdominal discomfort that occurs on and off.

## 2016-05-27 NOTE — Progress Notes (Signed)
  Subjective:     Danielle Esparza is a [age] y.o. female and is here for a comprehensive physical exam. The patient reports no problems. Continues nursing. Infant born in 12/2013. Needs f/u pap for + HPV on previous.  Social History   Social History  . Marital Status: Married    Spouse Name: N/A  . Number of Children: N/A  . Years of Education: N/A   Occupational History  . Not on file.   Social History Main Topics  . Smoking status: Never Smoker   . Smokeless tobacco: Never Used  . Alcohol Use: No  . Drug Use: No  . Sexual Activity:    Partners: Male    Birth Control/ Protection: OCP, Pill   Other Topics Concern  . Not on file   Social History Narrative   Health Maintenance  Topic Date Due  . INFLUENZA VACCINE  07/12/2016  . PAP SMEAR  05/25/2018  . TETANUS/TDAP  10/12/2025  . HIV Screening  Completed    The following portions of the patient's history were reviewed and updated as appropriate: allergies, current medications, past family history, past medical history, past social history, past surgical history and problem list.  Review of Systems Pertinent items noted in HPI and remainder of comprehensive ROS otherwise negative.   Objective:    BP 112/71 mmHg  Pulse 67  Resp 18  Ht 5' 4.75" (1.645 m)  Wt 147 lb (66.679 kg)  BMI 24.64 kg/m2  LMP 04/22/2016  Breastfeeding? Yes General appearance: alert, cooperative and appears stated age Head: Normocephalic, without obvious abnormality, atraumatic Neck: no adenopathy, supple, symmetrical, trachea midline and thyroid not enlarged, symmetric, no tenderness/mass/nodules Lungs: clear to auscultation bilaterally Breasts: normal appearance, no masses or tenderness Heart: regular rate and rhythm, S1, S2 normal, no murmur, click, rub or gallop Abdomen: soft, non-tender; bowel sounds normal; no masses,  no organomegaly Pelvic: cervix normal in appearance, external genitalia normal, no adnexal masses or tenderness, no  cervical motion tenderness, uterus normal size, shape, and consistency and vagina normal without discharge Extremities: extremities normal, atraumatic, no cyanosis or edema Pulses: 2+ and symmetric Skin: Skin color, texture, turgor normal. No rashes or lesions Lymph nodes: Cervical, supraclavicular, and axillary nodes normal. Neurologic: Grossly normal    Assessment:    Healthy female exam.      Plan:      Problem List Items Addressed This Visit      High   RESOLVED: Supervision of normal first pregnancy     Unprioritized   Cervical high risk HPV (human papillomavirus) test positive    Other Visit Diagnoses    Encounter for routine gynecological examination    -  Primary    Relevant Orders    CBC    Comprehensive metabolic panel    Lipid panel    TSH    Screening for malignant neoplasm of cervix        Relevant Orders    Cytology - PAP       See After Visit Summary for Counseling Recommendations

## 2016-05-30 LAB — T4, FREE: FREE T4: 1.1 ng/dL (ref 0.8–1.8)

## 2016-05-30 LAB — T3, FREE: T3 FREE: 3 pg/mL (ref 2.3–4.2)

## 2016-05-31 LAB — CYTOLOGY - PAP

## 2016-06-01 ENCOUNTER — Telehealth: Payer: Self-pay | Admitting: *Deleted

## 2016-06-01 NOTE — Telephone Encounter (Signed)
Called pt to adv no appt set for labs. LM for her to rtn call to make nurse only visit for lab

## 2016-06-01 NOTE — Telephone Encounter (Signed)
-----   Message from Reva Boresanya S Pratt, MD sent at 05/31/2016  4:32 PM EDT ----- I know, she is going to come in for TSH, Free T3, Free T4 and CBC in 3-4 wks. ----- Message -----    From: Arne ClevelandMandy J Hutchinson, CMA    Sent: 05/31/2016   3:36 PM      To: Reva Boresanya S Pratt, MD  T3 and T4 are resulted   ----- Message -----    From: Reva Boresanya S Pratt, MD    Sent: 05/30/2016   7:56 AM      To: Gita KudoKristen S Lassiter, RN, #  Please add Free T 3 and Free T 4 to labs, repeat CBC and TFT's  in 3-4 wks

## 2016-06-23 ENCOUNTER — Other Ambulatory Visit: Payer: BC Managed Care – PPO | Admitting: *Deleted

## 2016-06-23 DIAGNOSIS — R7989 Other specified abnormal findings of blood chemistry: Secondary | ICD-10-CM

## 2016-06-24 LAB — CBC
HCT: 31.8 % — ABNORMAL LOW (ref 35.0–45.0)
HEMOGLOBIN: 10.6 g/dL — AB (ref 11.7–15.5)
MCH: 28.7 pg (ref 27.0–33.0)
MCHC: 33.3 g/dL (ref 32.0–36.0)
MCV: 86.2 fL (ref 80.0–100.0)
MPV: 10 fL (ref 7.5–12.5)
Platelets: 247 10*3/uL (ref 140–400)
RBC: 3.69 MIL/uL — AB (ref 3.80–5.10)
RDW: 14.6 % (ref 11.0–15.0)
WBC: 4.5 10*3/uL (ref 3.8–10.8)

## 2016-06-24 LAB — T4, FREE: Free T4: 1.2 ng/dL (ref 0.8–1.8)

## 2016-06-24 LAB — T3, FREE: T3, Free: 3.1 pg/mL (ref 2.3–4.2)

## 2016-06-24 LAB — TSH: TSH: 0.71 mIU/L

## 2016-10-08 IMAGING — US US MFM FETAL NUCHAL TRANSLUCENCY
1 series · 13 of 28 positions shown · non-contrast
Comparison: none

[Series 1: nt · 0.19mm/px · 13 of 58 slices shown]
[im 3/58]
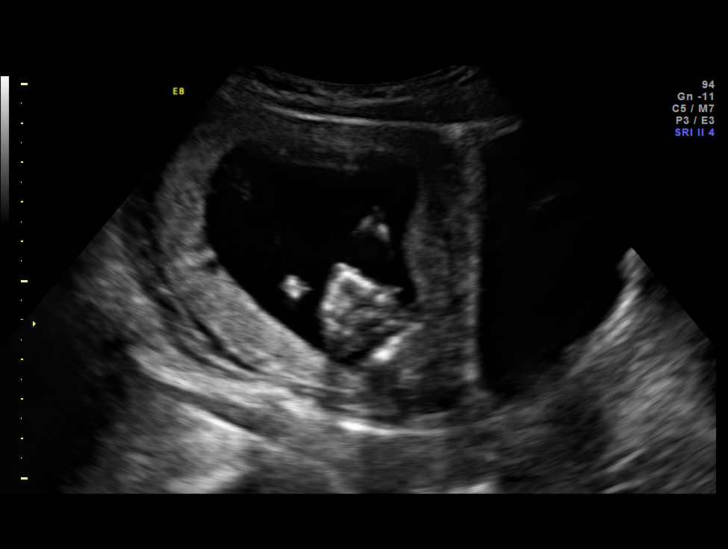
[im 7/58]
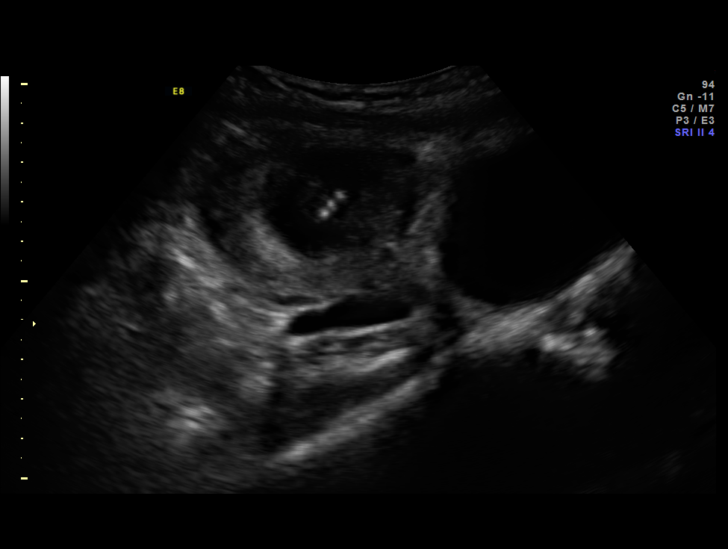
[im 11/58]
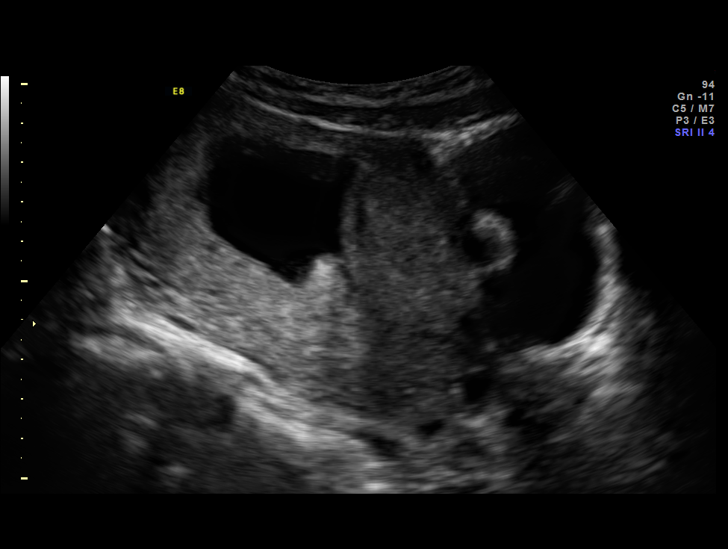
[im 15/58]
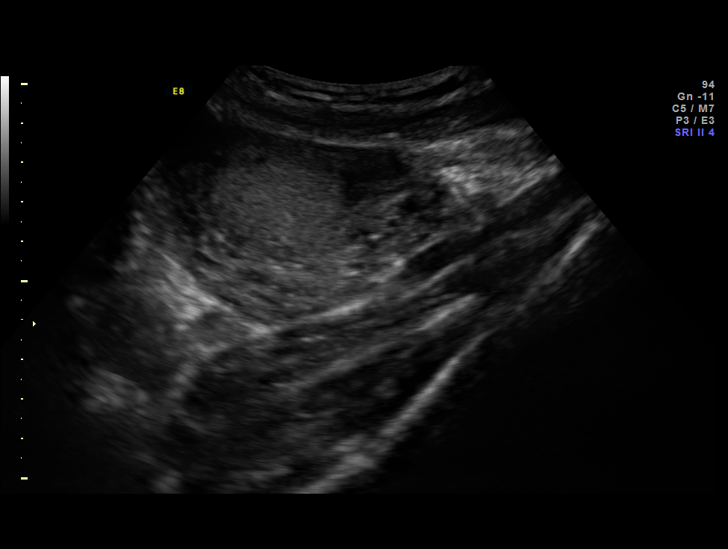
[im 20/58]
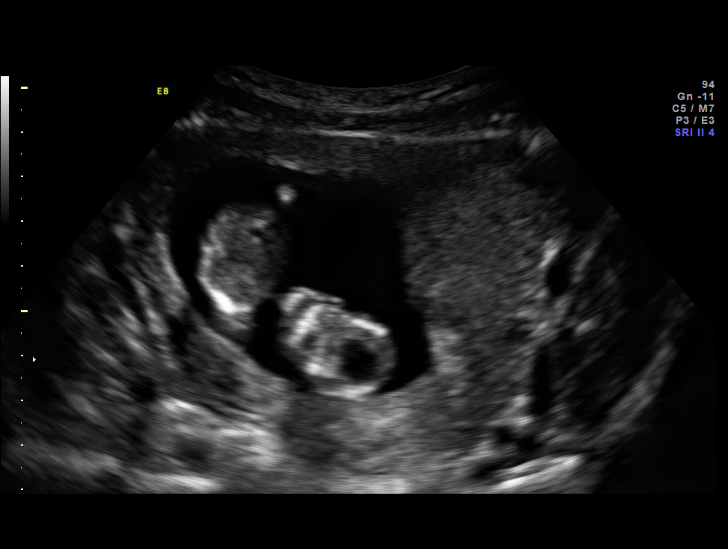
[im 24/58]
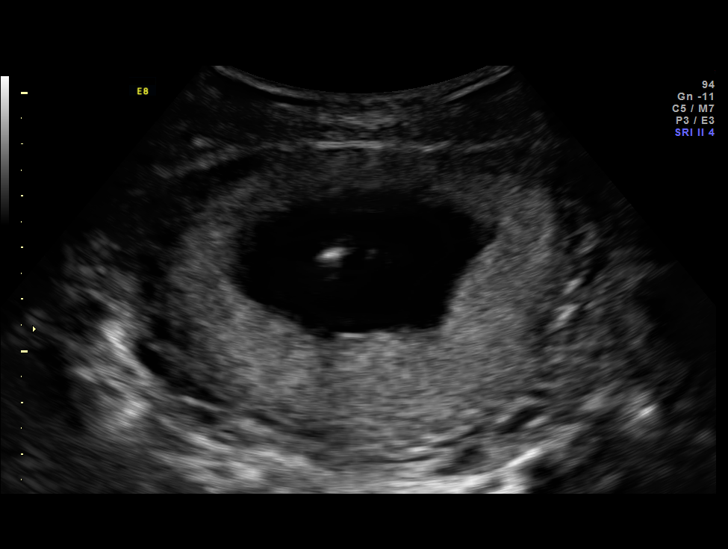
[im 30/58]
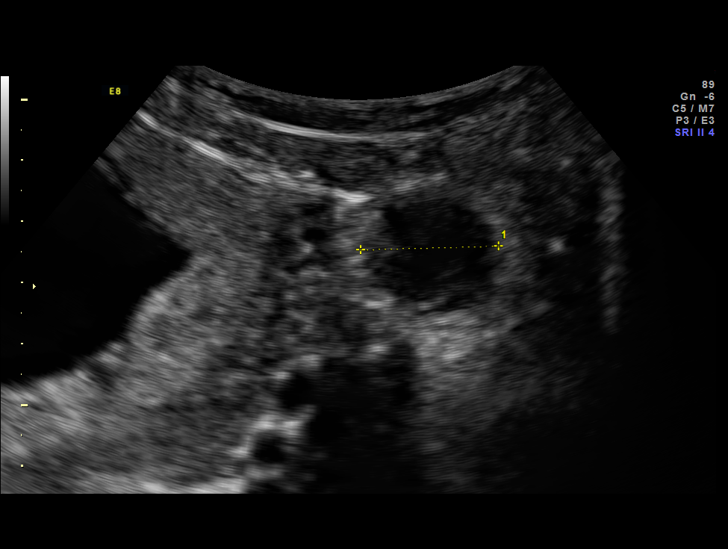
[im 34/58]
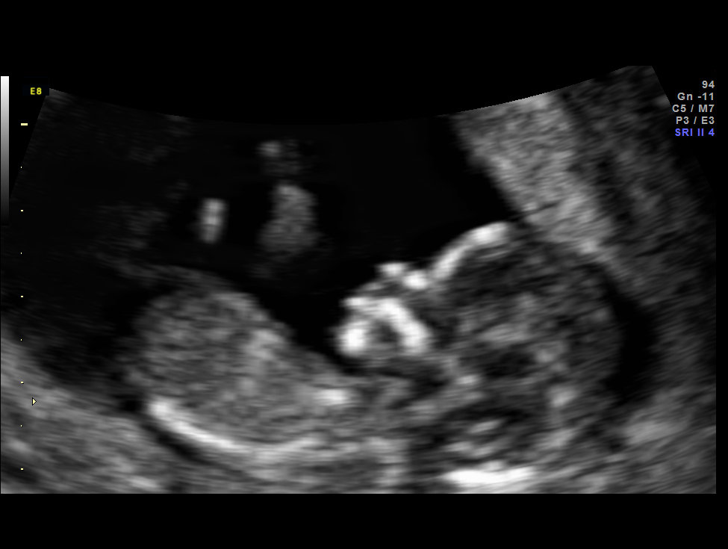
[im 39/58]
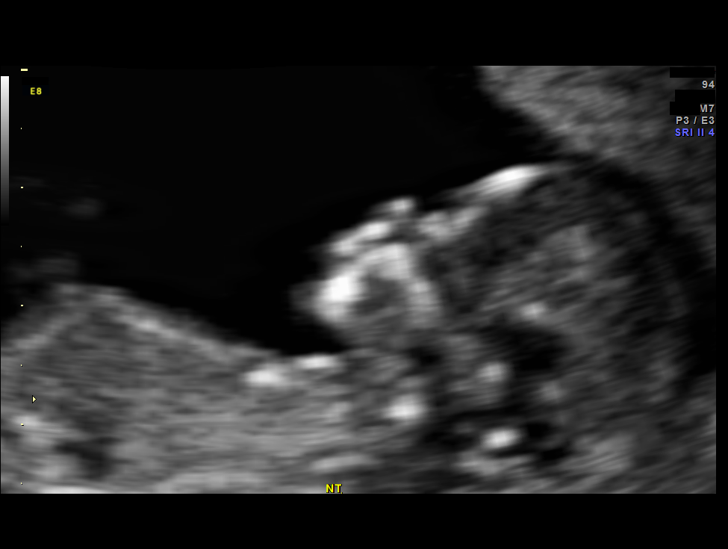
[im 43/58]
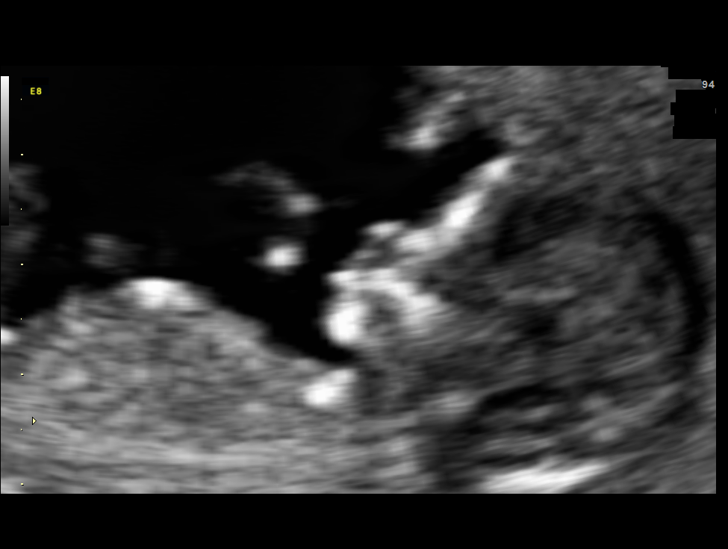
[im 47/58]
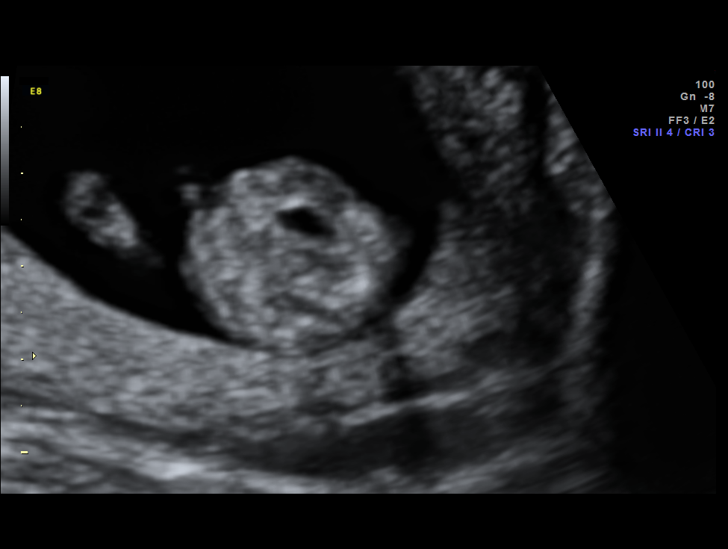
[im 51/58]
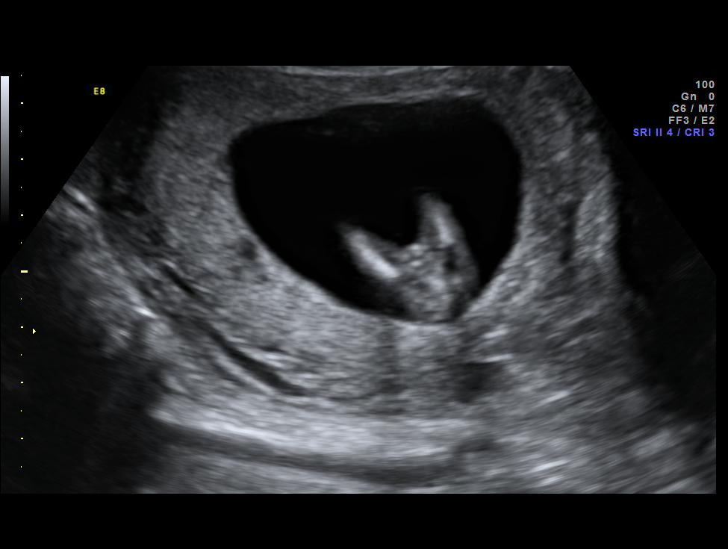
[im 55/58]
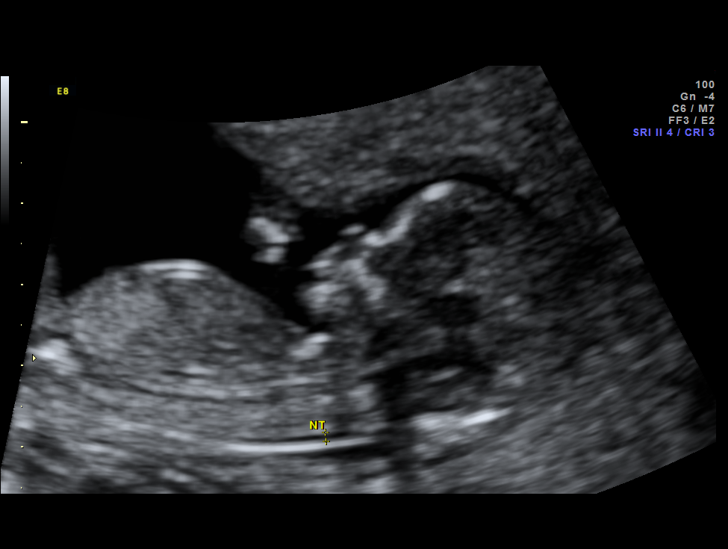

[13 of 28 positions shown; findings below may reference images not displayed]

OBSTETRICS REPORT
(Signed Final 06/30/2015 [DATE])

Date:

Service(s) Provided

Indications

12 weeks gestation of pregnancy
First trimester aneuploidy screen (NT)                 Z36
Fetal Evaluation

Num Of             1
Fetuses:
Preg. Location:    Intrauterine
Gest. Sac:         Intrauterine
Yolk Sac:          Not visualized
Fetal Pole:        Visualized
Fetal Heart        159                          bpm
Rate:
Cardiac Activity:  Observed

Amniotic Fluid
AFI FV:      Subjectively within normal limits
Biometry

CRL:     69.9   m    G. Age:   13w 0d                  EDD:   01/05/16
m

NT:        1.4  m
m
Gestational Age

LMP:           12w 6d        Date:  04/01/15                  EDD:   01/06/16
Best:          12w 6d    Det. By:   LMP  (04/01/15)           EDD:   01/06/16
1st Trimester Genetic Sonogram Screening

CRL:            69.9  mm     G. Age:   13w 0d                 EDD:   01/05/16
Nuc Trans:       1.4  mm

Nasal Bone:                  Present
Cervix Uterus Adnexa

Cervix:       Normal appearance by transabdominal scan.
Uterus:       No abnormality visualized.
Cul De Sac:   No free fluid seen.

Left Ovary:    Within normal limits.
Right Ovary:   Not visualized.

Adnexa:     No abnormality visualized.
Impression

SIUP at 12+6 weeks
No gross abnormalities identified
NT measurement was within normal limits for this GA; NB
present
Normal amniotic fluid volume
Measurements consistent with LMP dating
Recommendations

Offer MSAFP in the second trimester for ONTD screening
Offer anatomy U/S by 18 weeks

## 2016-11-01 ENCOUNTER — Ambulatory Visit: Payer: BC Managed Care – PPO | Admitting: Family Medicine

## 2016-11-01 NOTE — Progress Notes (Deleted)
   Patient: Danielle Esparza Female    DOB: [birth year]   35 y.o.   MRN: [id number] Visit Date: 11/01/2016  Today's Provider: Dortha Kernennis Chrismon, PA   No chief complaint on file.  Subjective:    URI        Previous Medications   JOLIVETTE 0.35 MG TABLET    TK 1 T PO QD   PRENATAL VITAMIN W/FE, FA (NATACHEW) 29-1 MG CHEW CHEWABLE TABLET    Chew 1 tablet by mouth daily at 12 noon. Reported on 05/27/2016    Review of Systems  Social History  Substance Use Topics  . Smoking status: Never Smoker  . Smokeless tobacco: Never Used  . Alcohol use No   Objective:   There were no vitals taken for this visit.  Physical Exam      Assessment & Plan:       Follow up: No Follow-up on file.

## 2016-11-02 ENCOUNTER — Ambulatory Visit (INDEPENDENT_AMBULATORY_CARE_PROVIDER_SITE_OTHER): Payer: BC Managed Care – PPO | Admitting: Family Medicine

## 2016-11-02 ENCOUNTER — Encounter: Payer: Self-pay | Admitting: Family Medicine

## 2016-11-02 VITALS — BP 110/60 | HR 60 | Temp 98.4°F | Resp 16 | Wt 137.0 lb

## 2016-11-02 DIAGNOSIS — J40 Bronchitis, not specified as acute or chronic: Secondary | ICD-10-CM

## 2016-11-02 DIAGNOSIS — J019 Acute sinusitis, unspecified: Secondary | ICD-10-CM | POA: Diagnosis not present

## 2016-11-02 MED ORDER — AZITHROMYCIN 250 MG PO TABS: ORAL_TABLET | ORAL | 0 refills | Status: AC

## 2016-11-02 NOTE — Patient Instructions (Signed)
Cough, Adult Coughing is a reflex that clears your throat and your airways. Coughing helps to heal and protect your lungs. It is normal to cough occasionally, but a cough that happens with other symptoms or lasts a long time may be a sign of a condition that needs treatment. A cough may last only 2-3 weeks (acute), or it may last longer than 8 weeks (chronic). What are the causes? Coughing is commonly caused by:  Breathing in substances that irritate your lungs.  A viral or bacterial respiratory infection.  Allergies.  Asthma.  Postnasal drip.  Smoking.  Acid backing up from the stomach into the esophagus (gastroesophageal reflux).  Certain medicines.  Chronic lung problems, including COPD (or rarely, lung cancer).  Other medical conditions such as heart failure.  Follow these instructions at home: Pay attention to any changes in your symptoms. Take these actions to help with your discomfort:  Take medicines only as told by your health care provider. ? If you were prescribed an antibiotic medicine, take it as told by your health care provider. Do not stop taking the antibiotic even if you start to feel better. ? Talk with your health care provider before you take a cough suppressant medicine.  Drink enough fluid to keep your urine clear or pale yellow.  If the air is dry, use a cold steam vaporizer or humidifier in your bedroom or your home to help loosen secretions.  Avoid anything that causes you to cough at work or at home.  If your cough is worse at night, try sleeping in a semi-upright position.  Avoid cigarette smoke. If you smoke, quit smoking. If you need help quitting, ask your health care provider.  Avoid caffeine.  Avoid alcohol.  Rest as needed.  Contact a health care provider if:  You have new symptoms.  You cough up pus.  Your cough does not get better after 2-3 weeks, or your cough gets worse.  You cannot control your cough with suppressant  medicines and you are losing sleep.  You develop pain that is getting worse or pain that is not controlled with pain medicines.  You have a fever.  You have unexplained weight loss.  You have night sweats. Get help right away if:  You cough up blood.  You have difficulty breathing.  Your heartbeat is very fast. This information is not intended to replace advice given to you by your health care provider. Make sure you discuss any questions you have with your health care provider. Document Released: 05/27/2011 Document Revised: 05/05/2016 Document Reviewed: 02/04/2015 Elsevier Interactive Patient Education  2017 Elsevier Inc.  

## 2016-11-02 NOTE — Progress Notes (Signed)
Patient: Danielle Esparza Female    DOB: 09/20/1981   [age] y.o.   MRN: [id number] Visit Date: 11/02/2016  Today's Provider: Mila Merryonald Yanelle Sousa, MD   Chief Complaint  Patient presents with  . Cough   Subjective:    Cough started 8 days ago. Patient reports symptoms of chest congestion, nasal congestion, cough, headaches, left ear pain, and sinus pressure. Also some sob and mild body aches. Patient stated that she took otc benadryl with mild relief.    Cough  This is a new problem. The current episode started in the past 7 days (8 days ago). The problem has been gradually improving. The problem occurs every few minutes. The cough is productive of sputum. Associated symptoms include ear congestion, ear pain, headaches, myalgias, nasal congestion, postnasal drip, rhinorrhea, shortness of breath and weight loss. Pertinent negatives include no chest pain, chills, fever, heartburn, hemoptysis, rash, sore throat, sweats or wheezing. Associated symptoms comments: Left ear pain. Nothing aggravates the symptoms. Treatments tried: benadryl  The treatment provided moderate relief. Her past medical history is significant for environmental allergies. There is no history of asthma, bronchiectasis, bronchitis, COPD, emphysema or pneumonia.      No Known Allergies   Current Outpatient Prescriptions:  .  JOLIVETTE 0.35 MG tablet, TK 1 T PO QD, Disp: , Rfl: 11  Review of Systems  Constitutional: Positive for weight loss. Negative for appetite change, chills, fatigue and fever.  HENT: Positive for congestion, ear pain, postnasal drip, rhinorrhea and sinus pressure. Negative for sore throat.   Respiratory: Positive for cough, chest tightness and shortness of breath. Negative for hemoptysis and wheezing.   Cardiovascular: Negative for chest pain and palpitations.  Gastrointestinal: Negative for abdominal pain, heartburn, nausea and vomiting.  Musculoskeletal: Positive for myalgias.  Skin: Negative for  rash.  Allergic/Immunologic: Positive for environmental allergies.  Neurological: Positive for headaches. Negative for dizziness and weakness.    Social History  Substance Use Topics  . Smoking status: Never Smoker  . Smokeless tobacco: Never Used  . Alcohol use No   Objective:   BP 110/60 (BP Location: Right Arm, Patient Position: Sitting, Cuff Size: Normal)   Pulse 60   Temp 98.4 F (36.9 C) (Oral)   Resp 16   Wt 137 lb (62.1 kg)   SpO2 99%   BMI 22.97 kg/m   Physical Exam  General Appearance:    Alert, cooperative, no distress  HENT:   bilateral TM normal without fluid or infection, neck without nodes, maxillary  sinus tender and nasal mucosa pale and congested  Eyes:    PERRL, conjunctiva/corneas clear, EOM's intact       Lungs:     Occasional expiratory wheeze, no rales, , respirations unlabored  Heart:    Regular rate and rhythm  Neurologic:   Awake, alert, oriented x 3. No apparent focal neurological           defect.           Assessment & Plan:      1. Bronchitis  - azithromycin (ZITHROMAX) 250 MG tablet; 2 by mouth today, then 1 daily for 4 days  Dispense: 6 tablet; Refill: 0  2. Acute sinusitis, recurrence not specified, unspecified location  - azithromycin (ZITHROMAX) 250 MG tablet; 2 by mouth today, then 1 daily for 4 days  Dispense: 6 tablet; Refill: 0  Call if symptoms change or if not rapidly improving.   The entirety of the information documented in the  History of Present Illness, Review of Systems and Physical Exam were personally obtained by me. Portions of this information were initially documented by [name], CMA and reviewed by me for thoroughness and accuracy.          Mila Merryonald Rayn Shorb, MD  [facility]

## 2016-11-29 ENCOUNTER — Encounter: Payer: Self-pay | Admitting: Family Medicine

## 2016-11-29 ENCOUNTER — Ambulatory Visit (INDEPENDENT_AMBULATORY_CARE_PROVIDER_SITE_OTHER): Payer: BC Managed Care – PPO | Admitting: Family Medicine

## 2016-11-29 VITALS — BP 104/72 | HR 68 | Temp 98.9°F | Resp 16 | Wt 135.0 lb

## 2016-11-29 DIAGNOSIS — J029 Acute pharyngitis, unspecified: Secondary | ICD-10-CM

## 2016-11-29 DIAGNOSIS — J069 Acute upper respiratory infection, unspecified: Secondary | ICD-10-CM | POA: Diagnosis not present

## 2016-11-29 LAB — POCT RAPID STREP A (OFFICE): RAPID STREP A SCREEN: NEGATIVE

## 2016-11-29 LAB — POCT INFLUENZA A/B
INFLUENZA A, POC: NEGATIVE
INFLUENZA B, POC: NEGATIVE

## 2016-11-29 MED ORDER — AMOXICILLIN 500 MG PO CAPS: 1000.0000 mg | ORAL_CAPSULE | Freq: Two times a day (BID) | ORAL | 0 refills | Status: DC

## 2016-11-29 MED ORDER — AZITHROMYCIN 250 MG PO TABS: ORAL_TABLET | ORAL | 0 refills | Status: DC

## 2016-11-29 MED ORDER — AMOXICILLIN 500 MG PO CAPS: 1000.0000 mg | ORAL_CAPSULE | Freq: Two times a day (BID) | ORAL | 0 refills | Status: AC

## 2016-11-29 NOTE — Progress Notes (Signed)
       Patient: Danielle Esparza Female    DOB: [birth year]   35 y.o.   MRN: [id number] Visit Date: 11/29/2016  Today's Provider: Mila Merryonald Fisher, MD   Chief Complaint  Patient presents with  . Sore Throat   Subjective:    HPI Patient comes in today c/o URI symptoms X 1 week. She reports that she has had a sore throat since last night. Patient reports that she may have had a fever last week. She also mentions that her daughter had similar symptoms and was exposed to RSV at day care. She is 3911 months old and patient is breastfeeding. . Patient has not been taking anything OTC for her symptoms.     No Known Allergies   Current Outpatient Prescriptions:  .  JOLIVETTE 0.35 MG tablet, TK 1 T PO QD, Disp: , Rfl: 11  Review of Systems  Constitutional: Positive for chills and fatigue.  HENT: Positive for congestion, ear pain, postnasal drip and sore throat.   Respiratory: Positive for cough.     Social History  Substance Use Topics  . Smoking status: Never Smoker  . Smokeless tobacco: Never Used  . Alcohol use No   Objective:   BP 104/72 (BP Location: Left Arm, Patient Position: Sitting, Cuff Size: Normal)   Pulse 68   Temp 98.9 F (37.2 C)   Resp 16   Wt 135 lb (61.2 kg)   BMI 22.64 kg/m   Physical Exam  General Appearance:    Alert, cooperative, no distress  HENT:   bilateral TM normal without fluid or infection, neck has bilateral anterior cervical nodes enlarged, pharynx erythematous without exudate and nasal mucosa congested. Slight frontal sinus tenderness  Eyes:    PERRL, conjunctiva/corneas clear, EOM's intact       Lungs:     Clear to auscultation bilaterally, respirations unlabored  Heart:    Regular rate and rhythm  Neurologic:   Awake, alert, oriented x 3. No apparent focal neurological           defect.       Results for orders placed or performed in visit on 11/29/16  POCT Influenza A/B  Result Value Ref Range   Influenza A, POC Negative Negative   Influenza B, POC Negative Negative  POCT rapid strep A  Result Value Ref Range   Rapid Strep A Screen Negative Negative        Assessment & Plan:     1. Sore throat  - POCT Influenza A/B - POCT rapid strep A  2. Upper respiratory tract infection, unspecified type Symptoms mostly consistent with viral infection. Suspect duration of URI symptoms due to being exposed to multiple viruses. Discusses symptoms of bacterial infection and to fill amoxicillin prescription if such symptoms develop or if not improving in another 4-5 days.   - amoxicillin (AMOXIL) 500 MG capsule; Take 2 capsules (1,000 mg total) by mouth 2 (two) times daily.  Dispense: 40 capsule; Refill: 0     The entirety of the information documented in the History of Present Illness, Review of Systems and Physical Exam were personally obtained by me. Portions of this information were initially documented by Anson Oregonachelle Presley, CMA and reviewed by me for thoroughness and accuracy.    Mila Merryonald Fisher, MD  [facility]

## 2016-11-29 NOTE — Patient Instructions (Signed)
Upper Respiratory Infection, Adult Most upper respiratory infections (URIs) are a viral infection of the air passages leading to the lungs. A URI affects the nose, throat, and upper air passages. The most common type of URI is nasopharyngitis and is typically referred to as "the common cold." URIs run their course and usually go away on their own. Most of the time, a URI does not require medical attention, but sometimes a bacterial infection in the upper airways can follow a viral infection. This is called a secondary infection. Sinus and middle ear infections are common types of secondary upper respiratory infections. Bacterial pneumonia can also complicate a URI. A URI can worsen asthma and chronic obstructive pulmonary disease (COPD). Sometimes, these complications can require emergency medical care and may be life threatening. What are the causes? Almost all URIs are caused by viruses. A virus is a type of germ and can spread from one person to another. What increases the risk? You may be at risk for a URI if:  You smoke.  You have chronic heart or lung disease.  You have a weakened defense (immune) system.  You are very young or very old.  You have nasal allergies or asthma.  You work in crowded or poorly ventilated areas.  You work in health care facilities or schools.  What are the signs or symptoms? Symptoms typically develop 2-3 days after you come in contact with a cold virus. Most viral URIs last 7-10 days. However, viral URIs from the influenza virus (flu virus) can last 14-18 days and are typically more severe. Symptoms may include:  Runny or stuffy (congested) nose.  Sneezing.  Cough.  Sore throat.  Headache.  Fatigue.  Fever.  Loss of appetite.  Pain in your forehead, behind your eyes, and over your cheekbones (sinus pain).  Muscle aches.  How is this diagnosed? Your health care provider may diagnose a URI by:  Physical exam.  Tests to check that your  symptoms are not due to another condition such as: ? Strep throat. ? Sinusitis. ? Pneumonia. ? Asthma.  How is this treated? A URI goes away on its own with time. It cannot be cured with medicines, but medicines may be prescribed or recommended to relieve symptoms. Medicines may help:  Reduce your fever.  Reduce your cough.  Relieve nasal congestion.  Follow these instructions at home:  Take medicines only as directed by your health care provider.  Gargle warm saltwater or take cough drops to comfort your throat as directed by your health care provider.  Use a warm mist humidifier or inhale steam from a shower to increase air moisture. This may make it easier to breathe.  Drink enough fluid to keep your urine clear or pale yellow.  Eat soups and other clear broths and maintain good nutrition.  Rest as needed.  Return to work when your temperature has returned to normal or as your health care provider advises. You may need to stay home longer to avoid infecting others. You can also use a face mask and careful hand washing to prevent spread of the virus.  Increase the usage of your inhaler if you have asthma.  Do not use any tobacco products, including cigarettes, chewing tobacco, or electronic cigarettes. If you need help quitting, ask your health care provider. How is this prevented? The best way to protect yourself from getting a cold is to practice good hygiene.  Avoid oral or hand contact with people with cold symptoms.  Wash your   hands often if contact occurs.  There is no clear evidence that vitamin C, vitamin E, echinacea, or exercise reduces the chance of developing a cold. However, it is always recommended to get plenty of rest, exercise, and practice good nutrition. Contact a health care provider if:  You are getting worse rather than better.  Your symptoms are not controlled by medicine.  You have chills.  You have worsening shortness of breath.  You have  brown or red mucus.  You have yellow or brown nasal discharge.  You have pain in your face, especially when you bend forward.  You have a fever.  You have swollen neck glands.  You have pain while swallowing.  You have white areas in the back of your throat. Get help right away if:  You have severe or persistent: ? Headache. ? Ear pain. ? Sinus pain. ? Chest pain.  You have chronic lung disease and any of the following: ? Wheezing. ? Prolonged cough. ? Coughing up blood. ? A change in your usual mucus.  You have a stiff neck.  You have changes in your: ? Vision. ? Hearing. ? Thinking. ? Mood. This information is not intended to replace advice given to you by your health care provider. Make sure you discuss any questions you have with your health care provider. Document Released: 05/24/2001 Document Revised: 07/31/2016 Document Reviewed: 03/05/2014 Elsevier Interactive Patient Education  2017 Elsevier Inc.  

## 2017-01-23 ENCOUNTER — Inpatient Hospital Stay (HOSPITAL_COMMUNITY): Payer: BC Managed Care – PPO

## 2017-01-23 ENCOUNTER — Ambulatory Visit (INDEPENDENT_AMBULATORY_CARE_PROVIDER_SITE_OTHER): Payer: BC Managed Care – PPO | Admitting: Family Medicine

## 2017-01-23 ENCOUNTER — Encounter (HOSPITAL_COMMUNITY): Payer: Self-pay | Admitting: *Deleted

## 2017-01-23 ENCOUNTER — Inpatient Hospital Stay (HOSPITAL_COMMUNITY)
Admission: AD | Admit: 2017-01-23 | Discharge: 2017-01-23 | Disposition: A | Discharge status: Home / Self Care | Payer: BC Managed Care – PPO | Source: Ambulatory Visit | Attending: Obstetrics and Gynecology | Admitting: Obstetrics and Gynecology

## 2017-01-23 ENCOUNTER — Encounter: Payer: Self-pay | Admitting: Family Medicine

## 2017-01-23 VITALS — BP 100/60 | HR 64 | Temp 98.9°F | Resp 16 | Wt 128.0 lb

## 2017-01-23 DIAGNOSIS — O200 Threatened abortion: Secondary | ICD-10-CM

## 2017-01-23 DIAGNOSIS — O209 Hemorrhage in early pregnancy, unspecified: Secondary | ICD-10-CM

## 2017-01-23 DIAGNOSIS — Z3A10 10 weeks gestation of pregnancy: Secondary | ICD-10-CM | POA: Insufficient documentation

## 2017-01-23 DIAGNOSIS — Z3490 Encounter for supervision of normal pregnancy, unspecified, unspecified trimester: Secondary | ICD-10-CM

## 2017-01-23 DIAGNOSIS — O039 Complete or unspecified spontaneous abortion without complication: Secondary | ICD-10-CM | POA: Diagnosis not present

## 2017-01-23 LAB — URINALYSIS, ROUTINE W REFLEX MICROSCOPIC
BACTERIA UA: NONE SEEN
BILIRUBIN URINE: NEGATIVE
GLUCOSE, UA: NEGATIVE mg/dL
Ketones, ur: NEGATIVE mg/dL
LEUKOCYTES UA: NEGATIVE
NITRITE: NEGATIVE
Protein, ur: NEGATIVE mg/dL
SPECIFIC GRAVITY, URINE: 1.01 (ref 1.005–1.030)
pH: 6 (ref 5.0–8.0)

## 2017-01-23 LAB — HCG, QUANTITATIVE, PREGNANCY: hCG, Beta Chain, Quant, S: 698 m[IU]/mL — ABNORMAL HIGH (ref <5)

## 2017-01-23 LAB — POCT URINE PREGNANCY: Preg Test, Ur: POSITIVE — AB

## 2017-01-23 NOTE — Progress Notes (Signed)
       Patient: Danielle Esparza Female    DOB: November 23, 1981   36 y.o.   MRN: [id number] Visit Date: 01/23/2017  Today's Provider: Mila Merryonald Jailen Coward, MD   Chief Complaint  Patient presents with  . Possible Pregnancy   Subjective:    Patient has taken 3 home pregnancy tests since 01/09/2017 that are all positive. Patient has not had pregnancy confirmed. Patient stated that she started spotting on Saturday and has also been seeing clots of blood. Patient stated that is she also has low back pain and lower abdominal pain.   she called office of her obstetrician (Dr. Shawnie PonsPratt) this morning and told to go the [facility] in [place]. She then [facility] and states she was told she needed a referral. She states she spend a long time on the phone with women's hospital without getting an appointment made, so she called to make an appointment her instead. She states she did have some blood clots this morning, but very little little bleeding for the last few hours and cramping has improved.    No Known Allergies   Current Outpatient Prescriptions:  .  JOLIVETTE 0.35 MG tablet, TK 1 T PO QD, Disp: , Rfl: 11  Review of Systems  Constitutional: Negative for appetite change, chills, fatigue and fever.  Respiratory: Negative for chest tightness and shortness of breath.   Cardiovascular: Negative for chest pain and palpitations.  Gastrointestinal: Negative for abdominal pain, nausea and vomiting.  Neurological: Negative for dizziness and weakness.    Social History  Substance Use Topics  . Smoking status: Never Smoker  . Smokeless tobacco: Never Used  . Alcohol use No   Objective:   BP 100/60 (BP Location: Right Arm, Patient Position: Sitting, Cuff Size: Normal)   Pulse 64   Temp 98.9 F (37.2 C) (Oral)   Resp 16   Wt 128 lb (58.1 kg)   LMP 11/09/2016 (Exact Date)   SpO2 99%   Breastfeeding? Yes   BMI 21.47 kg/m   Physical Exam  General appearance: alert, well developed,  well nourished, cooperative and in no distress Head: Normocephalic, without obvious abnormality, atraumatic Respiratory: Respirations even and unlabored, normal respiratory rate Extremities: No gross deformities Skin: Skin color, texture, turgor normal. No rashes seen  Psych: Appropriate mood and affect. Neurologic: Mental status: Alert, oriented to person, place, and time, thought content appropriate.  Results for orders placed or performed in visit on 01/23/17  POCT urine pregnancy  Result Value Ref Range   Preg Test, Ur Positive (A) Negative       Assessment & Plan:     1. Pregnancy, unspecified gestational age  - POCT urine pregnancy  2. Threatened abortion in early pregnancy I called [facility] and spoke to nurse who spoke with MD. Nurse states they will [facility] admissions [facility] and that patient will arrive imminently. Gave instructions to patient to go immediately to admissions at [facility] in [place].        Mila Merryonald Ethell Blatchford, MD  [facility]

## 2017-01-23 NOTE — MAU Note (Signed)
Took a HPT, 2 wks ago, faint line, repeated test last wk, +HPT; started spotting on Sat.  Started more like a period with a lot of clots today. Went to family dr  Earlier today, passed a lg clot while there, confirmed preg. Had some pain yesterday

## 2017-01-23 NOTE — Discharge Instructions (Signed)

## 2017-01-23 NOTE — Patient Instructions (Addendum)
   Go immediately to Maternal Admissions Unit at [facility] in [place]

## 2017-01-23 NOTE — MAU Provider Note (Signed)
  History     CSN: [id number]  Arrival date and time: 01/23/17 1704   None     No chief complaint on file.  HPI 36 yo G2P1 presenting today for the evaluation of vaginal bleeding in early pregnancy. Patient is 353w5d by LMP. Patient reports onset of vaginal bleeding on 2/10. She states that initially it was spotting but has progressed to period-like vaginal bleeding with passage of clots. She also reports some lower abdominal and back pain since Saturday. She was seen by [facility] [place] today who instructed her to come to MAU. Patient denies feeling dizzy or lightheaded. She denies chest pain or SOB    Past Medical History:  Diagnosis Date  . Allergy    seasonal  . Hemoglobin A-S genotype/sickle cell trait 05/28/2015  . Nipple discharge 2014    Past Surgical History:  Procedure Laterality Date  . NO PAST SURGERIES      Family History  Problem Relation Age of Onset  . Prostate cancer Father   . Sarcoidosis Father   . Breast cancer Paternal Grandmother   . Breast cancer Maternal Aunt [id number]    ovarian cancer at age 34    Social History  Substance Use Topics  . Smoking status: Never Smoker  . Smokeless tobacco: Never Used  . Alcohol use No    Allergies: No Known Allergies  Prescriptions Prior to Admission  Medication Sig Dispense Refill Last Dose  . Prenatal Vit-Fe Fumarate-FA (PRENATAL MULTIVITAMIN) TABS tablet Take 1 tablet by mouth 2 (two) times daily.    01/22/2017 at Unknown time    Review of Systems  See pertinent in HPI Physical Exam   Blood pressure 115/72, pulse 65, temperature 99.1 F (37.3 C), temperature source Oral, resp. rate 16, height 5' 4.75" (1.645 m), weight 127 lb 12 oz (57.9 kg), last menstrual period 11/09/2016, currently breastfeeding.  Physical Exam GENERAL: Well-developed, well-nourished female in no acute distress.  LUNGS: Clear to auscultation bilaterally.  HEART: Regular rate and rhythm. ABDOMEN: Soft, nontender, nondistended. No  organomegaly. PELVIC: Normal external female genitalia. Vagina is pink and rugated.  Normal discharge. Small amount of blood in vaginal vault. Normal appearing cervix with bright red blood oozing from os. Cervix dilated 1 cm.  Uterus is normal in size. No adnexal mass or tenderness. EXTREMITIES: No cyanosis, clubbing, or edema, 2+ distal pulses.  MAU Course  Procedures  MDM Pelvic ultrasound FINDINGS: Intrauterine gestational sac: Not visualized.  Yolk sac:  Not visualized.  Embryo:  Not visualized.  Cardiac Activity: Not applicable.  Subchorionic hemorrhage:  Not applicable.  Maternal uterus/adnexae: Negative.  IMPRESSION: No evidence of pregnancy by ultrasound most compatible with completed abortion or abortion in progress given gestational age by last menstrual period.   Electronically Signed   By: Drusilla Kannerhomas  Dalessio M.D.   On: 01/23/2017 18:47  Assessment and Plan  36 yo with spontaneous miscarriage - Ultrasound results reviewed with the patient - Quant HCG obtained today - Will have patient have repeat quant HCG on Wednesday or Thursday for confirmation - precautions reviewed - RTC prn  Danielle Esparza 01/23/2017, 7:00 PM

## 2017-01-25 ENCOUNTER — Other Ambulatory Visit (INDEPENDENT_AMBULATORY_CARE_PROVIDER_SITE_OTHER): Payer: BC Managed Care – PPO | Admitting: *Deleted

## 2017-01-25 DIAGNOSIS — O021 Missed abortion: Secondary | ICD-10-CM

## 2017-01-26 ENCOUNTER — Telehealth: Payer: Self-pay | Admitting: *Deleted

## 2017-01-26 LAB — BETA HCG QUANT (REF LAB): HCG QUANT: 284 m[IU]/mL

## 2017-01-26 NOTE — Telephone Encounter (Signed)
-----   Message from Danielle AntiguaPeggy Constant, MD sent at 01/26/2017 11:49 AM EST ----- Please inform patient of decreasing HCG levels consistent with a miscarriage. She needs to return weekly until neg. She should schedule an appointment if she is interested in contraception  Thanks  Peggy

## 2017-01-26 NOTE — Telephone Encounter (Signed)
Informed pt of result, scheduled appt for 01-30-17 at 0815 for repeat BHCG.

## 2017-01-30 ENCOUNTER — Other Ambulatory Visit (INDEPENDENT_AMBULATORY_CARE_PROVIDER_SITE_OTHER): Payer: BC Managed Care – PPO | Admitting: *Deleted

## 2017-01-30 DIAGNOSIS — O021 Missed abortion: Secondary | ICD-10-CM | POA: Diagnosis not present

## 2017-01-30 NOTE — Progress Notes (Signed)
Pt here today for repeat BHCG.  

## 2017-01-31 LAB — BETA HCG QUANT (REF LAB): hCG Quant: 48 m[IU]/mL

## 2017-02-08 ENCOUNTER — Other Ambulatory Visit (INDEPENDENT_AMBULATORY_CARE_PROVIDER_SITE_OTHER): Payer: BC Managed Care – PPO | Admitting: *Deleted

## 2017-02-08 DIAGNOSIS — O021 Missed abortion: Secondary | ICD-10-CM

## 2017-02-08 NOTE — Progress Notes (Signed)
Pt here today for repeat BHCG level.

## 2017-02-09 ENCOUNTER — Telehealth: Payer: Self-pay | Admitting: *Deleted

## 2017-02-09 LAB — BETA HCG QUANT (REF LAB): hCG Quant: 4 m[IU]/mL

## 2017-02-09 NOTE — Telephone Encounter (Signed)
-----   Message from Reva Boresanya S Pratt, MD sent at 02/09/2017  8:25 AM EST ----- Her HCG is now low!

## 2017-02-09 NOTE — Telephone Encounter (Signed)
Called pt, no answer, left VM that level was normal and to call the office if she had any additional questions.

## 2017-08-08 ENCOUNTER — Encounter: Payer: Self-pay | Admitting: Family Medicine

## 2017-08-08 ENCOUNTER — Ambulatory Visit (INDEPENDENT_AMBULATORY_CARE_PROVIDER_SITE_OTHER): Payer: BC Managed Care – PPO | Admitting: Family Medicine

## 2017-08-08 VITALS — BP 100/68 | HR 61 | Ht 64.75 in | Wt 124.8 lb

## 2017-08-08 DIAGNOSIS — Z3041 Encounter for surveillance of contraceptive pills: Secondary | ICD-10-CM | POA: Diagnosis not present

## 2017-08-08 DIAGNOSIS — Z01419 Encounter for gynecological examination (general) (routine) without abnormal findings: Secondary | ICD-10-CM

## 2017-08-08 DIAGNOSIS — Z124 Encounter for screening for malignant neoplasm of cervix: Secondary | ICD-10-CM

## 2017-08-08 MED ORDER — NORETHIN ACE-ETH ESTRAD-FE 1-20 MG-MCG(24) PO TABS: 1.0000 | ORAL_TABLET | Freq: Every day | ORAL | 11 refills | Status: DC

## 2017-08-08 NOTE — Patient Instructions (Signed)
Preventive Care 18-39 Years, Female Preventive care refers to lifestyle choices and visits with your health care provider that can promote health and wellness. What does preventive care include?  A yearly physical exam. This is also called an annual well check.  Dental exams once or twice a year.  Routine eye exams. Ask your health care provider how often you should have your eyes checked.  Personal lifestyle choices, including: ? Daily care of your teeth and gums. ? Regular physical activity. ? Eating a healthy diet. ? Avoiding tobacco and drug use. ? Limiting alcohol use. ? Practicing safe sex. ? Taking vitamin and mineral supplements as recommended by your health care provider. What happens during an annual well check? The services and screenings done by your health care provider during your annual well check will depend on your age, overall health, lifestyle risk factors, and family history of disease. Counseling Your health care provider may ask you questions about your:  Alcohol use.  Tobacco use.  Drug use.  Emotional well-being.  Home and relationship well-being.  Sexual activity.  Eating habits.  Work and work Statistician.  Method of birth control.  Menstrual cycle.  Pregnancy history.  Screening You may have the following tests or measurements:  Height, weight, and BMI.  Diabetes screening. This is done by checking your blood sugar (glucose) after you have not eaten for a while (fasting).  Blood pressure.  Lipid and cholesterol levels. These may be checked every 5 years starting at age 38.  Skin check.  Hepatitis C blood test.  Hepatitis B blood test.  Sexually transmitted disease (STD) testing.  BRCA-related cancer screening. This may be done if you have a family history of breast, ovarian, tubal, or peritoneal cancers.  Pelvic exam and Pap test. This may be done every 3 years starting at age 38. Starting at age 30, this may be done  every 5 years if you have a Pap test in combination with an HPV test.  Discuss your test results, treatment options, and if necessary, the need for more tests with your health care provider. Vaccines Your health care provider may recommend certain vaccines, such as:  Influenza vaccine. This is recommended every year.  Tetanus, diphtheria, and acellular pertussis (Tdap, Td) vaccine. You may need a Td booster every 10 years.  Varicella vaccine. You may need this if you have not been vaccinated.  HPV vaccine. If you are 39 or younger, you may need three doses over 6 months.  Measles, mumps, and rubella (MMR) vaccine. You may need at least one dose of MMR. You may also need a second dose.  Pneumococcal 13-valent conjugate (PCV13) vaccine. You may need this if you have certain conditions and were not previously vaccinated.  Pneumococcal polysaccharide (PPSV23) vaccine. You may need one or two doses if you smoke cigarettes or if you have certain conditions.  Meningococcal vaccine. One dose is recommended if you are age 68-21 years and a first-year college student living in a residence hall, or if you have one of several medical conditions. You may also need additional booster doses.  Hepatitis A vaccine. You may need this if you have certain conditions or if you travel or work in places where you may be exposed to hepatitis A.  Hepatitis B vaccine. You may need this if you have certain conditions or if you travel or work in places where you may be exposed to hepatitis B.  Haemophilus influenzae type b (Hib) vaccine. You may need this  if you have certain risk factors.  Talk to your health care provider about which screenings and vaccines you need and how often you need them. This information is not intended to replace advice given to you by your health care provider. Make sure you discuss any questions you have with your health care provider. Document Released: 01/24/2002 Document Revised:  08/17/2016 Document Reviewed: 09/29/2015 Elsevier Interactive Patient Education  2017 Elsevier Inc.  

## 2017-08-08 NOTE — Progress Notes (Signed)
Last pap - 05/2016 - Normal SAB 01/2017

## 2017-08-08 NOTE — Progress Notes (Signed)
  Subjective:     Danielle Esparza is a 36 y.o. female and is here for a comprehensive physical exam. The patient reports problems - nausea. Associated 7 lb weight loss, though reports normal appetite. Not pregnant. Desires to resume OC's for contraception.  Social History   Social History  . Marital status: Married    Spouse name: N/A  . Number of children: N/A  . Years of education: N/A   Occupational History  . Not on file.   Social History Main Topics  . Smoking status: Never Smoker  . Smokeless tobacco: Never Used  . Alcohol use No  . Drug use: No  . Sexual activity: Yes    Partners: Male    Birth control/ protection: OCP, Pill   Other Topics Concern  . Not on file   Social History Narrative  . No narrative on file   Health Maintenance  Topic Date Due  . INFLUENZA VACCINE  07/12/2017  . PAP SMEAR  05/28/2019  . TETANUS/TDAP  10/12/2025  . HIV Screening  Completed    The following portions of the patient's history were reviewed and updated as appropriate: allergies, current medications, past family history, past medical history, past social history, past surgical history and problem list.  Review of Systems Pertinent items noted in HPI and remainder of comprehensive ROS otherwise negative.   Objective:    BP 100/68   Pulse 61   Ht 5' 4.75" (1.645 m)   Wt 124 lb 12.8 oz (56.6 kg)   LMP 07/31/2017   Breastfeeding? Unknown   BMI 20.93 kg/m  General appearance: alert, cooperative and appears stated age Head: Normocephalic, without obvious abnormality, atraumatic Neck: no adenopathy, supple, symmetrical, trachea midline and thyroid not enlarged, symmetric, no tenderness/mass/nodules Lungs: clear to auscultation bilaterally Breasts: normal appearance, no masses or tenderness Heart: regular rate and rhythm, S1, S2 normal, no murmur, click, rub or gallop Abdomen: soft, non-tender; bowel sounds normal; no masses,  no organomegaly Pelvic: cervix normal in  appearance, external genitalia normal, no adnexal masses or tenderness, no cervical motion tenderness, uterus normal size, shape, and consistency and vagina normal without discharge Extremities: extremities normal, atraumatic, no cyanosis or edema Pulses: 2+ and symmetric Skin: Skin color, texture, turgor normal. No rashes or lesions Lymph nodes: Cervical, supraclavicular, and axillary nodes normal. Neurologic: Grossly normal    Assessment:    Healthy female exam.      Plan:   Problem List Items Addressed This Visit    None    Visit Diagnoses    Encounter for gynecological examination without abnormal finding    -  Primary   Relevant Orders   TSH (Completed)   CBC (Completed)   Comprehensive metabolic panel (Completed)   Lipid panel (Completed)   Cytology - PAP   Screening for malignant neoplasm of cervix       Relevant Orders   Cytology - PAP   Encounter for surveillance of contraceptive pills       Relevant Medications   Norethindrone Acetate-Ethinyl Estrad-FE (LOESTRIN 24 FE) 1-20 MG-MCG(24) tablet   Other Relevant Orders   Cytology - PAP     If nausea does not improve, consider GI referral.   See After Visit Summary for Counseling Recommendations

## 2017-08-09 LAB — CBC
HEMATOCRIT: 34.9 % (ref 34.0–46.6)
Hemoglobin: 11.9 g/dL (ref 11.1–15.9)
MCH: 29.6 pg (ref 26.6–33.0)
MCHC: 34.1 g/dL (ref 31.5–35.7)
MCV: 87 fL (ref 79–97)
PLATELETS: 220 10*3/uL (ref 150–379)
RBC: 4.02 x10E6/uL (ref 3.77–5.28)
RDW: 13.6 % (ref 12.3–15.4)
WBC: 3.1 10*3/uL — ABNORMAL LOW (ref 3.4–10.8)

## 2017-08-09 LAB — COMPREHENSIVE METABOLIC PANEL
A/G RATIO: 1.6 (ref 1.2–2.2)
ALT: 15 IU/L (ref 0–32)
AST: 13 IU/L (ref 0–40)
Albumin: 4.6 g/dL (ref 3.5–5.5)
Alkaline Phosphatase: 66 IU/L (ref 39–117)
BUN/Creatinine Ratio: 11 (ref 9–23)
BUN: 9 mg/dL (ref 6–20)
Bilirubin Total: 0.4 mg/dL (ref 0.0–1.2)
CALCIUM: 9.4 mg/dL (ref 8.7–10.2)
CHLORIDE: 103 mmol/L (ref 96–106)
CO2: 22 mmol/L (ref 20–29)
Creatinine, Ser: 0.83 mg/dL (ref 0.57–1.00)
GFR calc Af Amer: 107 mL/min/{1.73_m2} (ref >59)
GFR, EST NON AFRICAN AMERICAN: 93 mL/min/{1.73_m2} (ref >59)
GLOBULIN, TOTAL: 2.9 g/dL (ref 1.5–4.5)
Glucose: 83 mg/dL (ref 65–99)
POTASSIUM: 3.8 mmol/L (ref 3.5–5.2)
SODIUM: 142 mmol/L (ref 134–144)
Total Protein: 7.5 g/dL (ref 6.0–8.5)

## 2017-08-09 LAB — LIPID PANEL
CHOL/HDL RATIO: 2.1 ratio (ref 0.0–4.4)
Cholesterol, Total: 145 mg/dL (ref 100–199)
HDL: 68 mg/dL (ref >39)
LDL Calculated: 69 mg/dL (ref 0–99)
TRIGLYCERIDES: 41 mg/dL (ref 0–149)
VLDL Cholesterol Cal: 8 mg/dL (ref 5–40)

## 2017-08-09 LAB — TSH: TSH: 0.855 u[IU]/mL (ref 0.450–4.500)

## 2017-08-09 MED ORDER — NORETHIN ACE-ETH ESTRAD-FE 1-20 MG-MCG(24) PO TABS: 1.0000 | ORAL_TABLET | Freq: Every day | ORAL | 11 refills | Status: DC

## 2017-08-18 LAB — CYTOLOGY - PAP
Diagnosis: NEGATIVE
HPV: NOT DETECTED

## 2017-11-13 ENCOUNTER — Encounter: Payer: Self-pay | Admitting: Family Medicine

## 2017-11-13 ENCOUNTER — Ambulatory Visit: Payer: BC Managed Care – PPO | Admitting: Family Medicine

## 2017-11-13 VITALS — BP 104/60 | HR 59 | Temp 98.7°F | Resp 16 | Wt 128.0 lb

## 2017-11-13 DIAGNOSIS — Z23 Encounter for immunization: Secondary | ICD-10-CM | POA: Diagnosis not present

## 2017-11-13 DIAGNOSIS — K5900 Constipation, unspecified: Secondary | ICD-10-CM | POA: Diagnosis not present

## 2017-11-13 DIAGNOSIS — R1030 Lower abdominal pain, unspecified: Secondary | ICD-10-CM

## 2017-11-13 DIAGNOSIS — K921 Melena: Secondary | ICD-10-CM

## 2017-11-13 LAB — POCT URINALYSIS DIPSTICK
BILIRUBIN UA: NEGATIVE
Glucose, UA: NEGATIVE
Ketones, UA: NEGATIVE
LEUKOCYTES UA: NEGATIVE
NITRITE UA: NEGATIVE
PH UA: 7 (ref 5.0–8.0)
Spec Grav, UA: 1.01 (ref 1.010–1.025)
Urobilinogen, UA: 0.2 E.U./dL

## 2017-11-13 LAB — POCT URINE PREGNANCY: Preg Test, Ur: NEGATIVE

## 2017-11-13 NOTE — Progress Notes (Signed)
Patient: Danielle Esparza Female    DOB: Apr 09, 1981   36 y.o.   MRN: [id number] Visit Date: 11/13/2017  Today's Provider: Mila Merryonald Fisher, MD   Chief Complaint  Patient presents with  . Blood In Stools   Subjective:    Patient stated 2 days ago she noticed a lot of mucous in her stool and a little bit of blood on the tissue. Patient states that she has had RLQ pain for about a week. Has also had some constipation. Patient states she saw more mucous and blood in stool yesterday.  she states she has had persistent mild to moderate RLQ pain for about two weeks which has migrated to mid lower quadrant. No left sided pain. Occasionally feels a little nausea. No fevers, chills, sweats or vomiting. No diarrhea. Blood has been seen on tissue, but not in stool itself. Has not been having more frequent BMs. No changes in diet except she is not eating as much which she attributes to stress and busy work schedule. No known family history of IBD or colon cancer.     No Known Allergies   Current Outpatient Medications:  .  Norethindrone Acetate-Ethinyl Estrad-FE (LOESTRIN 24 FE) 1-20 MG-MCG(24) tablet, Take 1 tablet by mouth daily., Disp: 1 Package, Rfl: 11  Review of Systems  Constitutional: Negative for appetite change, chills, fatigue and fever.  Respiratory: Negative for chest tightness and shortness of breath.   Cardiovascular: Negative for chest pain and palpitations.  Gastrointestinal: Positive for blood in stool. Negative for abdominal pain, nausea and vomiting.  Neurological: Negative for dizziness and weakness.    Social History   Tobacco Use  . Smoking status: Never Smoker  . Smokeless tobacco: Never Used  Substance Use Topics  . Alcohol use: No   Objective:   BP 104/60 (BP Location: Left Arm, Patient Position: Sitting, Cuff Size: Normal)   Pulse (!) 59   Temp 98.7 F (37.1 C) (Oral)   Resp 16   Wt 128 lb (58.1 kg)   LMP 11/01/2017   SpO2 98%   BMI 21.47 kg/m    Vitals:   11/13/17 1004  BP: 104/60  Pulse: (!) 59  Resp: 16  Temp: 98.7 F (37.1 C)  TempSrc: Oral  SpO2: 98%  Weight: 128 lb (58.1 kg)     Physical Exam  General Appearance:    Alert, cooperative, no distress  Eyes:    PERRL, conjunctiva/corneas clear, EOM's intact       Lungs:     Clear to auscultation bilaterally, respirations unlabored  Heart:    Regular rate and rhythm  Abdomen:   bowel sounds present and normal in all 4 quadrants, soft, round, nontender or nondistended. No CVA tenderness   Results for orders placed or performed in visit on 11/13/17  POCT urinalysis dipstick  Result Value Ref Range   Color, UA Dark Yellow    Clarity, UA Clear    Glucose, UA Neg    Bilirubin, UA Neg    Ketones, UA Neg    Spec Grav, UA 1.010 1.010 - 1.025   Blood, UA Trace Non-Hemolyzed    pH, UA 7.0 5.0 - 8.0   Protein, UA Trace    Urobilinogen, UA 0.2 0.2 or 1.0 E.U./dL   Nitrite, UA Neg    Leukocytes, UA Negative Negative  POCT urine pregnancy  Result Value Ref Range   Preg Test, Ur Negative Negative        Assessment & Plan:  1. Lower abdominal pain  - POCT urinalysis dipstick - POCT urine pregnancy - COMPLETE METABOLIC PANEL WITH GFR - CBC  Consider abdominal imaging studies after reviewing labs.   2. Blood in stool Most likely secondary to constipation  3. Constipation, unspecified constipation type   4. Flu vaccine need  - Flu Vaccine QUAD 36+ mos IM       Mila Merryonald Fisher, MD  [facility]

## 2017-11-14 LAB — COMPLETE METABOLIC PANEL WITH GFR
AG Ratio: 1.6 (calc) (ref 1.0–2.5)
ALBUMIN MSPROF: 4.1 g/dL (ref 3.6–5.1)
ALT: 7 U/L (ref 6–29)
AST: 9 U/L — AB (ref 10–30)
Alkaline phosphatase (APISO): 45 U/L (ref 33–115)
BILIRUBIN TOTAL: 0.4 mg/dL (ref 0.2–1.2)
BUN: 8 mg/dL (ref 7–25)
CHLORIDE: 108 mmol/L (ref 98–110)
CO2: 27 mmol/L (ref 20–32)
Calcium: 9.2 mg/dL (ref 8.6–10.2)
Creat: 0.92 mg/dL (ref 0.50–1.10)
GFR, Est African American: 94 mL/min/{1.73_m2} (ref >=60)
GFR, Est Non African American: 81 mL/min/{1.73_m2} (ref >=60)
GLOBULIN: 2.6 g/dL (ref 1.9–3.7)
GLUCOSE: 75 mg/dL (ref 65–99)
Potassium: 3.9 mmol/L (ref 3.5–5.3)
SODIUM: 141 mmol/L (ref 135–146)
Total Protein: 6.7 g/dL (ref 6.1–8.1)

## 2017-11-14 LAB — CBC
HCT: 31.1 % — ABNORMAL LOW (ref 35.0–45.0)
Hemoglobin: 10.4 g/dL — ABNORMAL LOW (ref 11.7–15.5)
MCH: 29.5 pg (ref 27.0–33.0)
MCHC: 33.4 g/dL (ref 32.0–36.0)
MCV: 88.1 fL (ref 80.0–100.0)
MPV: 11.3 fL (ref 7.5–12.5)
PLATELETS: 189 10*3/uL (ref 140–400)
RBC: 3.53 10*6/uL — ABNORMAL LOW (ref 3.80–5.10)
RDW: 12.5 % (ref 11.0–15.0)
WBC: 3.4 10*3/uL — AB (ref 3.8–10.8)

## 2017-11-14 LAB — IRON,TIBC AND FERRITIN PANEL
%SAT: 14 % (ref 11–50)
FERRITIN: 26 ng/mL (ref 10–154)
Iron: 50 ug/dL (ref 40–190)
TIBC: 358 ug/dL (ref 250–450)

## 2017-11-14 LAB — TEST AUTHORIZATION

## 2017-11-14 LAB — VITAMIN B12: VITAMIN B 12: 502 pg/mL (ref 200–1100)

## 2017-11-16 ENCOUNTER — Telehealth: Payer: Self-pay | Admitting: Physician Assistant

## 2017-11-16 NOTE — Telephone Encounter (Signed)
Left detailed pt know that results are not in yet. Okay per dpr.

## 2017-11-16 NOTE — Telephone Encounter (Signed)
Please advise results? 

## 2017-11-16 NOTE — Telephone Encounter (Signed)
Pt is requesting results.  [id number]#[phone]/[id number]/MW

## 2017-11-16 NOTE — Telephone Encounter (Signed)
This is a Dr. Sherrie MustacheFisher patient.  Per my review however she does have a decrease in HgB to 10.4 from 11.9 3 months ago. Iron is low normal. B12 is normal. CMP is normal.

## 2017-11-17 NOTE — Telephone Encounter (Signed)
Patient advised. She states the pain is inconsistent and feels like it is gradually improving. Advised patient to call back if pain becomes worse for CT to be ordered.

## 2017-11-17 NOTE — Telephone Encounter (Signed)
Labs show she is mildly anemia. Otherwise normal. If pain is not better then needs to schedule CT abdomen with contrast.

## 2018-01-02 ENCOUNTER — Encounter: Payer: Self-pay | Admitting: Family Medicine

## 2018-01-02 ENCOUNTER — Ambulatory Visit: Payer: BC Managed Care – PPO | Admitting: Family Medicine

## 2018-01-02 VITALS — BP 94/60 | HR 85 | Temp 98.6°F | Resp 16 | Wt 130.0 lb

## 2018-01-02 DIAGNOSIS — J039 Acute tonsillitis, unspecified: Secondary | ICD-10-CM

## 2018-01-02 DIAGNOSIS — J029 Acute pharyngitis, unspecified: Secondary | ICD-10-CM

## 2018-01-02 LAB — POCT RAPID STREP A (OFFICE): Rapid Strep A Screen: NEGATIVE

## 2018-01-02 MED ORDER — AZITHROMYCIN 250 MG PO TABS: ORAL_TABLET | ORAL | 0 refills | Status: AC

## 2018-01-02 NOTE — Progress Notes (Signed)
       Patient: Danielle Esparza Female    DOB: 09-30-1981   37 y.o.   MRN: [id number] Visit Date: 01/02/2018  Today's Provider: Mila Merryonald Fisher, MD   Chief Complaint  Patient presents with  . Night Sweats   Subjective:    HPI Sweats:  Patient comes in reporting that she has had various symptoms for the past 5 days. She reports having night sweats, sore throat, headaches, chills, body aches, and fever. Patient has tried taking Theraflu which improved her fever.    No Known Allergies   Current Outpatient Medications:  .  Norethindrone Acetate-Ethinyl Estrad-FE (LOESTRIN 24 FE) 1-20 MG-MCG(24) tablet, Take 1 tablet by mouth daily., Disp: 1 Package, Rfl: 11  Review of Systems  Constitutional: Positive for chills, diaphoresis, fatigue and fever. Negative for appetite change.  HENT: Positive for sore throat.   Respiratory: Negative for chest tightness and shortness of breath.   Cardiovascular: Negative for chest pain and palpitations.  Gastrointestinal: Negative for abdominal pain, nausea and vomiting.  Musculoskeletal: Positive for myalgias (in legs).  Neurological: Positive for headaches. Negative for dizziness and weakness.    Social History   Tobacco Use  . Smoking status: Never Smoker  . Smokeless tobacco: Never Used  Substance Use Topics  . Alcohol use: No   Objective:   BP 94/60 (BP Location: Left Arm, Patient Position: Sitting, Cuff Size: Normal)   Pulse 85   Temp 98.6 F (37 C) (Oral)   Resp 16   Wt 130 lb (59 kg)   SpO2 100% Comment: room air  BMI 21.80 kg/m  There were no vitals filed for this visit.   Physical Exam  General Appearance:    Alert, cooperative, no distress  HENT:   bilateral TM normal without fluid or infection, neck has bilateral anterior cervical nodes enlarged, pharynx erythematous without exudate, tonsils red, enlarged, with exudate present, sinuses nontender and nasal mucosa congested  Eyes:    PERRL, conjunctiva/corneas clear, EOM's  intact       Lungs:     Clear to auscultation bilaterally, respirations unlabored  Heart:    Regular rate and rhythm  Neurologic:   Awake, alert, oriented x 3. No apparent focal neurological           defect.       Results for orders placed or performed in visit on 01/02/18  POCT rapid strep A  Result Value Ref Range   Rapid Strep A Screen Negative Negative        Assessment & Plan:     1. Tonsillitis  - azithromycin (ZITHROMAX) 250 MG tablet; 2 by mouth today, then 1 daily for 4 days  Dispense: 6 tablet; Refill: 0 - POCT rapid strep A  2. Sore throat  - POCT rapid strep A  Call if symptoms change or if not rapidly improving.           Mila Merryonald Fisher, MD  [facility]

## 2018-03-08 ENCOUNTER — Telehealth: Payer: Self-pay | Admitting: Physician Assistant

## 2018-03-08 DIAGNOSIS — J301 Allergic rhinitis due to pollen: Secondary | ICD-10-CM

## 2018-03-08 MED ORDER — FLUTICASONE PROPIONATE 50 MCG/ACT NA SUSP: 2.0000 | Freq: Every day | NASAL | 1 refills | Status: DC

## 2018-03-08 NOTE — Telephone Encounter (Signed)
Sent in

## 2018-03-08 NOTE — Telephone Encounter (Signed)
Patient requesting refill on her fluticasone (FLONASE) 50 MCG/ACT nasal spray    [facility] [address]

## 2018-05-24 ENCOUNTER — Encounter: Payer: Self-pay | Admitting: Radiology

## 2018-08-21 ENCOUNTER — Encounter: Payer: Self-pay | Admitting: Emergency Medicine

## 2018-08-21 ENCOUNTER — Ambulatory Visit
Admission: EM | Admit: 2018-08-21 | Discharge: 2018-08-21 | Disposition: A | Discharge status: Home / Self Care | Payer: BC Managed Care – PPO | Attending: Family Medicine | Admitting: Family Medicine

## 2018-08-21 ENCOUNTER — Other Ambulatory Visit: Payer: Self-pay

## 2018-08-21 DIAGNOSIS — R0789 Other chest pain: Secondary | ICD-10-CM | POA: Diagnosis not present

## 2018-08-21 DIAGNOSIS — M5489 Other dorsalgia: Secondary | ICD-10-CM

## 2018-08-21 DIAGNOSIS — J111 Influenza due to unidentified influenza virus with other respiratory manifestations: Secondary | ICD-10-CM

## 2018-08-21 DIAGNOSIS — R509 Fever, unspecified: Secondary | ICD-10-CM

## 2018-08-21 DIAGNOSIS — R69 Illness, unspecified: Principal | ICD-10-CM

## 2018-08-21 DIAGNOSIS — R05 Cough: Secondary | ICD-10-CM | POA: Diagnosis not present

## 2018-08-21 MED ORDER — BENZONATATE 200 MG PO CAPS: ORAL_CAPSULE | ORAL | 0 refills | Status: DC

## 2018-08-21 MED ORDER — HYDROCOD POLST-CPM POLST ER 10-8 MG/5ML PO SUER: 5.0000 mL | Freq: Two times a day (BID) | ORAL | 0 refills | Status: DC

## 2018-08-21 MED ORDER — OSELTAMIVIR PHOSPHATE 75 MG PO CAPS: 75.0000 mg | ORAL_CAPSULE | Freq: Two times a day (BID) | ORAL | 0 refills | Status: DC

## 2018-08-21 NOTE — Discharge Instructions (Signed)
Plenty of fluids.  Use of Tylenol or Motrin for fever and body aches.

## 2018-08-21 NOTE — ED Provider Notes (Signed)
[facility]    CSN: [id number] Arrival date & time: 08/21/18  1448     History   Chief Complaint Chief Complaint  Patient presents with  . Cough    HPI Danielle Esparza is a 37 y.o. female.   HPI  -year-old female presents with cough watery eyes back pain chest pain when coughing and chills.  She has a fever of 100.1.  This started on Sunday but worsened today quickly.  The pain radiates into her back when she coughs.  Appears ill but not toxic.           Past Medical History:  Diagnosis Date  . Allergy    seasonal  . Hemoglobin A-S genotype/sickle cell trait 05/28/2015  . Nipple discharge 2014    Patient Active Problem List   Diagnosis Date Noted  . Hemoglobin A-S genotype/sickle cell trait 05/28/2015  . Cervical high risk HPV (human papillomavirus) test positive 10/14/2013  . Lump or mass in breast 10/08/2013  . Nipple discharge 06/18/2013    Past Surgical History:  Procedure Laterality Date  . NO PAST SURGERIES      OB History    Gravida  2   Para  1   Term  1   Preterm  0   AB  1   Living  1     SAB  1   TAB  0   Ectopic  0   Multiple  0   Live Births  1        Obstetric Comments  Menstrual age: 89  Age 1st Pregnancy: N/A         Home Medications    Prior to Admission medications   Medication Sig Start Date End Date Taking? Authorizing Provider  fluticasone (FLONASE) 50 MCG/ACT nasal spray Place 2 sprays into both nostrils daily. 03/08/18  Yes Margaretann Loveless, PA-C  Norethindrone Acetate-Ethinyl Estrad-FE (LOESTRIN 24 FE) 1-20 MG-MCG(24) tablet Take 1 tablet by mouth daily. 08/09/17  Yes Reva Bores, MD  benzonatate (TESSALON) 200 MG capsule Take one cap TID PRN cough 08/21/18   Lutricia Feil, PA-C  chlorpheniramine-HYDROcodone [facility] ER) 10-8 MG/5ML SUER Take 5 mLs by mouth 2 (two) times daily. 08/21/18   Lutricia Feil, PA-C  oseltamivir (TAMIFLU) 75 MG capsule Take 1 capsule (75  mg total) by mouth every 12 (twelve) hours. 08/21/18   Lutricia Feil, PA-C    Family History Family History  Problem Relation Age of Onset  . Prostate cancer Father   . Sarcoidosis Father   . Breast cancer Paternal Grandmother   . Diabetes Paternal Grandmother   . Ovarian cancer Maternal Aunt 50  . Diabetes Paternal Grandfather   . Diabetes Maternal Grandmother     Social History Social History   Tobacco Use  . Smoking status: Never Smoker  . Smokeless tobacco: Never Used  Substance Use Topics  . Alcohol use: No  . Drug use: No     Allergies   Patient has no known allergies.   Review of Systems Review of Systems  Constitutional: Positive for activity change, chills, fatigue and fever.  HENT: Positive for congestion and postnasal drip.   Respiratory: Positive for cough and shortness of breath.   All other systems reviewed and are negative.    Physical Exam Triage Vital Signs ED Triage Vitals  Enc Vitals Group     BP 08/21/18 1504 124/76     Pulse Rate 08/21/18 1504 97  Resp 08/21/18 1504 18     Temp 08/21/18 1504 100.1 F (37.8 C)     Temp Source 08/21/18 1504 Oral     SpO2 08/21/18 1504 100 %     Weight 08/21/18 1503 130 lb (59 kg)     Height 08/21/18 1503 5' 4.75" (1.645 m)     Head Circumference --      Peak Flow --      Pain Score 08/21/18 1502 6     Pain Loc --      Pain Edu? --      Excl. in GC? --    No data found.  Updated Vital Signs BP 124/76 (BP Location: Left Arm)   Pulse 97   Temp 100.1 F (37.8 C) (Oral)   Resp 18   Ht 5' 4.75" (1.645 m)   Wt 130 lb (59 kg)   SpO2 100%   BMI 21.80 kg/m   Visual Acuity Right Eye Distance:   Left Eye Distance:   Bilateral Distance:    Right Eye Near:   Left Eye Near:    Bilateral Near:     Physical Exam  Constitutional: She is oriented to person, place, and time. She appears well-developed and well-nourished. No distress.  HENT:  Head: Normocephalic.  Right Ear: External ear  normal.  Left Ear: External ear normal.  Nose: Nose normal.  Mouth/Throat: Oropharynx is clear and moist. No oropharyngeal exudate.  Eyes: Pupils are equal, round, and reactive to light. Right eye exhibits no discharge. Left eye exhibits no discharge.  Neck: Normal range of motion.  Pulmonary/Chest: Effort normal and breath sounds normal.  Musculoskeletal: Normal range of motion.  Lymphadenopathy:    She has no cervical adenopathy.  Neurological: She is alert and oriented to person, place, and time.  Skin: Skin is warm and dry. She is not diaphoretic.  Psychiatric: She has a normal mood and affect. Her behavior is normal. Judgment and thought content normal.  Nursing note and vitals reviewed.    UC Treatments / Results  Labs (all labs ordered are listed, but only abnormal results are displayed) Labs Reviewed - No data to display  EKG None  Radiology No results found.  Procedures Procedures (including critical care time)  Medications Ordered in UC Medications - No data to display  Initial Impression / Assessment and Plan / UC Course  I have reviewed the triage vital signs and the nursing notes.  Pertinent labs & imaging results that were available during my care of the patient were reviewed by me and considered in my medical decision making (see chart for details).     Plan: 1. Test/x-ray results and diagnosis reviewed with patient 2. rx as per orders; risks, benefits, potential side effects reviewed with patient 3. Recommend supportive treatment with Tylenol or Motrin for fever and body aches.  Discussed with the patient that this is likely a virus and could possibly be influenza since her symptoms are very similar.  Offered her Tamiflu or allow her to just allowed to take its course.  She opted to try the Tamiflu.  Given her cough suppressants.  Keep her out of work for the next 2 days if necessary.  She is not improving recommend she follow-up with her primary care  physician. 4. F/u prn if symptoms worsen or don't improve  Final Clinical Impressions(s) / UC Diagnoses   Final diagnoses:  Influenza-like illness     Discharge Instructions     Plenty of fluids.  Use  of Tylenol or Motrin for fever and body aches.   ED Prescriptions    Medication Sig Dispense Auth. Provider   oseltamivir (TAMIFLU) 75 MG capsule Take 1 capsule (75 mg total) by mouth every 12 (twelve) hours. 10 capsule Lutricia Feil, PA-C   benzonatate (TESSALON) 200 MG capsule Take one cap TID PRN cough 30 capsule Lutricia Feil, PA-C   chlorpheniramine-HYDROcodone (TUSSIONEX PENNKINETIC ER) 10-8 MG/5ML SUER Take 5 mLs by mouth 2 (two) times daily. 115 mL Lutricia Feil, PA-C     Controlled Substance Prescriptions  Controlled Substance Registry consulted? Not Applicable   Lutricia Feil, PA-C 08/21/18 2009

## 2018-08-21 NOTE — ED Triage Notes (Signed)
Pt c/o cough, watery eyes, back pain, chest pain when coughing, and chills.

## 2018-08-29 ENCOUNTER — Ambulatory Visit (INDEPENDENT_AMBULATORY_CARE_PROVIDER_SITE_OTHER): Payer: BC Managed Care – PPO | Admitting: Family Medicine

## 2018-08-29 ENCOUNTER — Encounter: Payer: Self-pay | Admitting: Family Medicine

## 2018-08-29 VITALS — BP 96/60 | HR 90 | Temp 99.0°F | Resp 20 | Wt 132.0 lb

## 2018-08-29 DIAGNOSIS — J40 Bronchitis, not specified as acute or chronic: Secondary | ICD-10-CM | POA: Diagnosis not present

## 2018-08-29 MED ORDER — PREDNISONE 20 MG PO TABS: ORAL_TABLET | ORAL | 0 refills | Status: DC

## 2018-08-29 MED ORDER — DOXYCYCLINE HYCLATE 100 MG PO TABS: 100.0000 mg | ORAL_TABLET | Freq: Two times a day (BID) | ORAL | 0 refills | Status: DC

## 2018-08-29 NOTE — Patient Instructions (Signed)
Add Mucinex and prednisone. IF not improving over the next two days start the antibiotic.

## 2018-08-29 NOTE — Progress Notes (Signed)
  Subjective:     Patient ID: Danielle Esparza, female   DOB: 03/23/1981, 37 y.o.   MRN: [id number] Chief Complaint  Patient presents with  . Cough    patient here today C/O worsening cough since last week. Patient reports she went to urgent care and was given medications. Patient reports cough is worse at night time and painful. Patient was treated with Tamiflu.   HPI She is 10 days out from onset of a viral syndrome. Left with a  dry cough during the day and a productive cough of yellow to bloody sputum at night which keeps her up. All other sx have resolved per her report.  Review of Systems     Objective:   Physical Exam  Constitutional: She appears well-developed and well-nourished. No distress.  Lungs: clear     Assessment:    1. Bronchitis - predniSONE (DELTASONE) 20 MG tablet; One pill twice daily for 5 days  Dispense: 10 tablet; Refill: 0 - doxycycline (VIBRA-TABS) 100 MG tablet; Take 1 tablet (100 mg total) by mouth 2 (two) times daily.  Dispense: 14 tablet; Refill: 0    Plan:    She is to start with prednisone and expectorant. If not improving over the next two days will start the abx.

## 2018-09-06 ENCOUNTER — Other Ambulatory Visit: Payer: Self-pay

## 2018-09-06 DIAGNOSIS — Z3041 Encounter for surveillance of contraceptive pills: Secondary | ICD-10-CM

## 2018-09-06 MED ORDER — NORETHIN ACE-ETH ESTRAD-FE 1-20 MG-MCG(24) PO TABS: 1.0000 | ORAL_TABLET | Freq: Every day | ORAL | 11 refills | Status: DC

## 2018-09-06 NOTE — Telephone Encounter (Signed)
Refill on June birth control

## 2018-09-26 ENCOUNTER — Ambulatory Visit: Payer: BC Managed Care – PPO | Admitting: Family Medicine

## 2018-09-26 ENCOUNTER — Encounter: Payer: Self-pay | Admitting: Family Medicine

## 2018-09-26 ENCOUNTER — Ambulatory Visit
Admission: RE | Admit: 2018-09-26 | Discharge: 2018-09-26 | Disposition: A | Discharge status: Home / Self Care | Payer: BC Managed Care – PPO | Source: Ambulatory Visit | Attending: Family Medicine | Admitting: Family Medicine

## 2018-09-26 VITALS — BP 106/64 | HR 64 | Temp 98.3°F | Resp 16 | Wt 135.0 lb

## 2018-09-26 DIAGNOSIS — X58XXXA Exposure to other specified factors, initial encounter: Secondary | ICD-10-CM | POA: Diagnosis not present

## 2018-09-26 DIAGNOSIS — J0110 Acute frontal sinusitis, unspecified: Secondary | ICD-10-CM | POA: Diagnosis not present

## 2018-09-26 DIAGNOSIS — S2231XA Fracture of one rib, right side, initial encounter for closed fracture: Secondary | ICD-10-CM | POA: Insufficient documentation

## 2018-09-26 DIAGNOSIS — R0781 Pleurodynia: Secondary | ICD-10-CM | POA: Diagnosis not present

## 2018-09-26 MED ORDER — AMOXICILLIN 500 MG PO CAPS: 1000.0000 mg | ORAL_CAPSULE | Freq: Two times a day (BID) | ORAL | 0 refills | Status: AC

## 2018-09-26 NOTE — Patient Instructions (Addendum)
   Go to the [facility] on [address] for right rib Xray   Use your Neti-pot and Flonase every day until the fall allergy season is over.

## 2018-09-26 NOTE — Progress Notes (Signed)
Patient: Danielle Esparza Female    DOB: Apr 17, 1981   37 y.o.   MRN: [id number] Visit Date: 09/26/2018  Today's Provider: Mila Merry, MD   Chief Complaint  Patient presents with  . Sinusitis    Recurrent; Symptoms started again Sunday.   Subjective:    Sinusitis  This is a recurrent problem. The current episode started in the past 7 days. The problem has been gradually worsening since onset. There has been no fever. Associated symptoms include chills, congestion, coughing, ear pain, headaches, shortness of breath, sinus pressure and a sore throat. Pertinent negatives include no diaphoresis, hoarse voice, neck pain or sneezing. Past treatments include oral decongestants. The treatment provided mild relief.  she states drainage has been dark yellow. Has used fluticasone nasal spray and neti-pot occasionally, but not every day.   She also states she has been having persistent pain in right ribs for the last 3-4 weeks. She was seen last month with bronchitis with severe cough and states that she felt a pop in her rib during a coughing spell. The cough has resolved, but pain has been getting progressively worse since then.   No Known Allergies   Current Outpatient Medications:  .  fluticasone (FLONASE) 50 MCG/ACT nasal spray, Place 2 sprays into both nostrils daily., Disp: 48 g, Rfl: 1 .  Norethindrone Acetate-Ethinyl Estrad-FE (LOESTRIN 24 FE) 1-20 MG-MCG(24) tablet, Take 1 tablet by mouth daily., Disp: 1 Package, Rfl: 11 .  benzonatate (TESSALON) 200 MG capsule, Take one cap TID PRN cough (Patient not taking: Reported on 09/26/2018), Disp: 30 capsule, Rfl: 0 .  chlorpheniramine-HYDROcodone (TUSSIONEX PENNKINETIC ER) 10-8 MG/5ML SUER, Take 5 mLs by mouth 2 (two) times daily. (Patient not taking: Reported on 09/26/2018), Disp: 115 mL, Rfl: 0 .  doxycycline (VIBRA-TABS) 100 MG tablet, Take 1 tablet (100 mg total) by mouth 2 (two) times daily. (Patient not taking: Reported on  09/26/2018), Disp: 14 tablet, Rfl: 0 .  predniSONE (DELTASONE) 20 MG tablet, One pill twice daily for 5 days (Patient not taking: Reported on 09/26/2018), Disp: 10 tablet, Rfl: 0  Review of Systems  Constitutional: Positive for chills and fatigue. Negative for activity change, appetite change, diaphoresis, fever and unexpected weight change.  HENT: Positive for congestion, ear pain, postnasal drip, rhinorrhea, sinus pressure, sinus pain and sore throat. Negative for ear discharge, hoarse voice, nosebleeds, sneezing, tinnitus and trouble swallowing.   Eyes: Negative.   Respiratory: Positive for cough, shortness of breath and wheezing. Negative for apnea, choking, chest tightness and stridor.   Gastrointestinal: Positive for constipation. Negative for abdominal distention, abdominal pain, anal bleeding, blood in stool, diarrhea, nausea, rectal pain and vomiting.  Musculoskeletal: Negative for neck pain.       Pt is also complaining of rib pain.   Neurological: Positive for headaches. Negative for dizziness and light-headedness.    Social History   Tobacco Use  . Smoking status: Never Smoker  . Smokeless tobacco: Never Used  Substance Use Topics  . Alcohol use: No   Objective:   BP 106/64 (BP Location: Right Arm, Patient Position: Sitting, Cuff Size: Normal)   Pulse 64   Temp 98.3 F (36.8 C) (Oral)   Resp 16   Wt 135 lb (61.2 kg)   BMI 22.64 kg/m  Vitals:   09/26/18 0816  BP: 106/64  Pulse: 64  Resp: 16  Temp: 98.3 F (36.8 C)  TempSrc: Oral  Weight: 135 lb (61.2 kg)  Physical Exam  General Appearance:    Alert, cooperative, no distress  HENT:   bilateral TM normal without fluid or infection, neck without nodes, neck has bilateral anterior cervical nodes enlarged, frontal sinus tender and nasal mucosa congested  Eyes:    PERRL, conjunctiva/corneas clear, EOM's intact       Lungs:     Clear to auscultation bilaterally, respirations unlabored  Heart:    Regular rate  and rhythm  Neurologic:   Awake, alert, oriented x 3. No apparent focal neurological           defect.           Assessment & Plan:     1. Rib pain on right side Secondary to cough, which has resolved, but pain is worsening.  Need to rule out fracture - DG Ribs Unilateral Right; Future  2. Acute non-recurrent frontal sinusitis Advised to start using fluticasone and Neti-pot every day. Start 7 days amoxicillin.        Mila Merry, MD  [facility]

## 2018-09-27 ENCOUNTER — Telehealth: Payer: Self-pay

## 2018-09-27 NOTE — Telephone Encounter (Signed)
Patient advised and verbally voiced understanding. 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 She wants to know if it is still ok to do this with a rib fracture?

## 2018-09-27 NOTE — Telephone Encounter (Signed)
-----   Message from Malva Limes, MD sent at 09/26/2018  4:15 PM EDT ----- Danielle Esparza shows she had small fracture right ninth rib. Only treatment is to apply heat three times a day and will heal in 4-6 weeks.

## 2018-09-27 NOTE — Telephone Encounter (Signed)
Pt returned Porsha's call. °Please call pt back. ° °Thanks, °[facility] ° °

## 2018-09-27 NOTE — Telephone Encounter (Signed)
That's fine, but she needs to avoid any heavy lifting with her upper extremities, and any blunt force to rib cage.

## 2018-09-27 NOTE — Telephone Encounter (Signed)
LVMTRC.,PC 

## 2018-09-27 NOTE — Telephone Encounter (Signed)
Patient was advised.,PC 

## 2018-10-08 ENCOUNTER — Telehealth: Payer: Self-pay | Admitting: Family Medicine

## 2018-10-08 DIAGNOSIS — R059 Cough, unspecified: Secondary | ICD-10-CM

## 2018-10-08 DIAGNOSIS — R05 Cough: Secondary | ICD-10-CM

## 2018-10-08 MED ORDER — MONTELUKAST SODIUM 10 MG PO TABS: 10.0000 mg | ORAL_TABLET | Freq: Every day | ORAL | 0 refills | Status: DC

## 2018-10-08 NOTE — Telephone Encounter (Signed)
Pt has gone to Urgent care and has been seen twice here for a cough that is not going away. Pt is wanting to know what else she needs to do or have done to help cure the cough she keeps experiencing.  Please advise asap.  Thanks, [employer]

## 2018-10-08 NOTE — Telephone Encounter (Signed)
That's fine. Have sent order for cxr to [facility]

## 2018-10-08 NOTE — Telephone Encounter (Signed)
Tried calling, no answer.   Thanks,   -Laura  

## 2018-10-08 NOTE — Telephone Encounter (Signed)
Pt advised.  She agreed to start Zyrtec and montelukast.  She also requested an order for a chest Xray.    Please advise.  Thanks,   -Vernona Rieger

## 2018-10-08 NOTE — Telephone Encounter (Signed)
Send in prescription for montelukast 10mg  every morning , #30, rf x 0. Also take OTC cetirizine (zyrtec) 10mg  once every evening. If cough not better in a week then will need referral to pulmonary.

## 2018-10-08 NOTE — Telephone Encounter (Signed)
Left message advising pt.   Thanks,   -Laura  

## 2019-06-24 ENCOUNTER — Telehealth: Payer: Self-pay | Admitting: *Deleted

## 2019-06-24 ENCOUNTER — Telehealth: Payer: BC Managed Care – PPO | Admitting: Family

## 2019-06-24 DIAGNOSIS — Z20828 Contact with and (suspected) exposure to other viral communicable diseases: Secondary | ICD-10-CM

## 2019-06-24 DIAGNOSIS — Z20822 Contact with and (suspected) exposure to covid-19: Secondary | ICD-10-CM

## 2019-06-24 MED ORDER — ALBUTEROL SULFATE HFA 108 (90 BASE) MCG/ACT IN AERS: 2.0000 | INHALATION_SPRAY | Freq: Four times a day (QID) | RESPIRATORY_TRACT | 0 refills | Status: DC | PRN

## 2019-06-24 NOTE — Progress Notes (Signed)
E-Visit for Corona Virus Screening   Your current symptoms could be consistent with the coronavirus.  I have sent your note to our Community Testing site. They should reach out to you in the next 24 hours to set up a time for testing if you qualify.   Approximately 5 minutes was spent documenting and reviewing patient's chart.    COVID-19 is a respiratory illness with symptoms that are similar to the flu. Symptoms are typically mild to moderate, but there have been cases of severe illness and death due to the virus. The following symptoms may appear 2-14 days after exposure: . Fever . Cough . Shortness of breath or difficulty breathing . Chills . Repeated shaking with chills . Muscle pain . Headache . Sore throat . New loss of taste or smell . Fatigue . Congestion or runny nose . Nausea or vomiting . Diarrhea  It is vitally important that if you feel that you have an infection such as this virus or any other virus that you stay home and away from places where you may spread it to others.  You should self-quarantine for 14 days if you have symptoms that could potentially be coronavirus or have been in close contact a with a person diagnosed with COVID-19 within the last 2 weeks. You should avoid contact with people age 106 and older.   You should wear a mask or cloth face covering over your nose and mouth if you must be around other people or animals, including pets (even at home). Try to stay at least 6 feet away from other people. This will protect the people around you.  You can use medication such as A prescription inhaler called Albuterol MDI 90 mcg /actuation 2 puffs every 4 hours as needed for shortness of breath, wheezing, cough  You may also take acetaminophen (Tylenol) as needed for fever.   Reduce your risk of any infection by using the same precautions used for avoiding the common cold or flu:  Marland Kitchen Wash your hands often with soap and warm water for at least 20 seconds.  If  soap and water are not readily available, use an alcohol-based hand sanitizer with at least 60% alcohol.  . If coughing or sneezing, cover your mouth and nose by coughing or sneezing into the elbow areas of your shirt or coat, into a tissue or into your sleeve (not your hands). . Avoid shaking hands with others and consider head nods or verbal greetings only. . Avoid touching your eyes, nose, or mouth with unwashed hands.  . Avoid close contact with people who are sick. . Avoid places or events with large numbers of people in one location, like concerts or sporting events. . Carefully consider travel plans you have or are making. . If you are planning any travel outside or inside the [place], visit the CDC's Travelers' Health webpage for the latest health notices. . If you have some symptoms but not all symptoms, continue to monitor at home and seek medical attention if your symptoms worsen. . If you are having a medical emergency, call 911.  HOME CARE . Only take medications as instructed by your medical team. . Drink plenty of fluids and get plenty of rest. . A steam or ultrasonic humidifier can help if you have congestion.   GET HELP RIGHT AWAY IF YOU HAVE EMERGENCY WARNING SIGNS** FOR COVID-19. If you or someone is showing any of these signs seek emergency medical care immediately. Call 911 or proceed to your  closest emergency facility if: . You develop worsening high fever. . Trouble breathing . Bluish lips or face . Persistent pain or pressure in the chest . New confusion . Inability to wake or stay awake . You cough up blood. . Your symptoms become more severe  **This list is not all possible symptoms. Contact your medical provider for any symptoms that are sever or concerning to you.   MAKE SURE YOU   Understand these instructions.  Will watch your condition.  Will get help right away if you are not doing well or get worse.  Your e-visit answers were reviewed by a board  certified advanced clinical practitioner to complete your personal care plan.  Depending on the condition, your plan could have included both over the counter or prescription medications.  If there is a problem please reply once you have received a response from your provider.  Your safety is important to us.  If you have drug allergies check your prescription carefully.    You can use MyChart to ask questions about today's visit, request a non-urgent call back, or ask for a work or school excuse for 24 hours related to this e-Visit. If it has been greater than 24 hours you will need to follow up with your provider, or enter a new e-Visit to address those concerns. You will get an e-mail in the next two days asking about your experience.  I hope that your e-visit has been valuable and will speed your recovery. Thank you for using e-visits.

## 2019-06-24 NOTE — Telephone Encounter (Signed)
Pt referred for covid-19 testing by Evelina Dun, NP. Pt scheduled for tomorrow at the [facility] at 8:30. Advised that this is a drive thru testing site. So will need to stay in the care with mask on and windows rolled up until time for testing. She voiced understanding.

## 2019-06-25 ENCOUNTER — Other Ambulatory Visit: Payer: BC Managed Care – PPO

## 2019-06-25 DIAGNOSIS — Z20822 Contact with and (suspected) exposure to covid-19: Secondary | ICD-10-CM

## 2019-06-29 LAB — NOVEL CORONAVIRUS, NAA: SARS-CoV-2, NAA: NOT DETECTED

## 2019-07-03 ENCOUNTER — Other Ambulatory Visit: Payer: Self-pay

## 2019-07-03 ENCOUNTER — Other Ambulatory Visit: Payer: BC Managed Care – PPO

## 2019-07-03 DIAGNOSIS — Z3201 Encounter for pregnancy test, result positive: Secondary | ICD-10-CM

## 2019-07-04 LAB — BETA HCG QUANT (REF LAB): hCG Quant: 262 m[IU]/mL

## 2019-07-05 ENCOUNTER — Other Ambulatory Visit: Payer: BC Managed Care – PPO

## 2019-07-05 ENCOUNTER — Other Ambulatory Visit: Payer: Self-pay

## 2019-07-05 DIAGNOSIS — Z3201 Encounter for pregnancy test, result positive: Secondary | ICD-10-CM

## 2019-07-06 LAB — BETA HCG QUANT (REF LAB): hCG Quant: 111 m[IU]/mL

## 2019-07-08 ENCOUNTER — Telehealth: Payer: Self-pay | Admitting: *Deleted

## 2019-07-08 NOTE — Telephone Encounter (Signed)
Called pt to inform her of her results and recommendations, appointments made for repeat lab and annual/follow up.

## 2019-07-08 NOTE — Telephone Encounter (Signed)
-----   Message from Caren Macadam, MD sent at 07/08/2019 10:52 AM EDT ----- Falling bHCG consistent with miscarriage. Recommend repeat in 1 week and clinic appointment after/same day. Last visit was 2018

## 2019-07-11 ENCOUNTER — Other Ambulatory Visit: Payer: BC Managed Care – PPO

## 2019-07-11 ENCOUNTER — Other Ambulatory Visit: Payer: Self-pay

## 2019-07-11 DIAGNOSIS — Z3201 Encounter for pregnancy test, result positive: Secondary | ICD-10-CM

## 2019-07-12 ENCOUNTER — Telehealth: Payer: Self-pay | Admitting: *Deleted

## 2019-07-12 LAB — BETA HCG QUANT (REF LAB): hCG Quant: 16 m[IU]/mL

## 2019-07-12 NOTE — Telephone Encounter (Signed)
Pt informed of lab results and needing to do one more HCG level. Pt to come in next week.

## 2019-07-18 ENCOUNTER — Other Ambulatory Visit: Payer: Self-pay

## 2019-07-18 ENCOUNTER — Other Ambulatory Visit: Payer: BC Managed Care – PPO

## 2019-07-18 DIAGNOSIS — O039 Complete or unspecified spontaneous abortion without complication: Secondary | ICD-10-CM

## 2019-07-18 NOTE — Progress Notes (Unsigned)
bet

## 2019-07-19 LAB — BETA HCG QUANT (REF LAB): hCG Quant: 2 m[IU]/mL

## 2019-08-13 ENCOUNTER — Encounter: Payer: Self-pay | Admitting: Obstetrics & Gynecology

## 2019-08-13 ENCOUNTER — Other Ambulatory Visit: Payer: Self-pay

## 2019-08-13 ENCOUNTER — Ambulatory Visit (INDEPENDENT_AMBULATORY_CARE_PROVIDER_SITE_OTHER): Payer: BC Managed Care – PPO | Admitting: Obstetrics & Gynecology

## 2019-08-13 VITALS — BP 111/70 | HR 70 | Ht 64.75 in | Wt 129.0 lb

## 2019-08-13 DIAGNOSIS — Z1151 Encounter for screening for human papillomavirus (HPV): Secondary | ICD-10-CM

## 2019-08-13 DIAGNOSIS — Z23 Encounter for immunization: Secondary | ICD-10-CM | POA: Diagnosis not present

## 2019-08-13 DIAGNOSIS — Z01419 Encounter for gynecological examination (general) (routine) without abnormal findings: Secondary | ICD-10-CM

## 2019-08-13 DIAGNOSIS — Z124 Encounter for screening for malignant neoplasm of cervix: Secondary | ICD-10-CM | POA: Diagnosis not present

## 2019-08-13 NOTE — Progress Notes (Signed)
Subjective:    Danielle Esparza is a 38 y.o. married 77P1 (36 yo daughter)  who presents for an annual exam. The patient has no complaints today. The patient is sexually active. GYN screening history: last pap: was normal. The patient wears seatbelts: yes. 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 Has the patient ever been transfused or tattooed?: yes. The patient reports that there is not domestic violence in her life.   Menstrual History: OB History    Gravida  3   Para  1   Term  1   Preterm  0   AB  2   Living  1     SAB  2   TAB  0   Ectopic  0   Multiple  0   Live Births  1        Obstetric Comments  Menstrual age: [age]  Age 1st Pregnancy: N/A        Menarche age: [age] Patient's last menstrual period was 07/29/2019 (exact date).    The following portions of the patient's history were reviewed and updated as appropriate: allergies, current medications, past family history, past medical history, past social history, past surgical history and problem list.  Review of Systems Pertinent items are noted in HPI.   FH- + breast cancer in paternal GM, + ovarian cancer in maternal aunt, + prostate cancer in her father, + stomach in paternal GF PharmacologistTeaches cheerleading at [employer], [place] Married for 5 years, not taking a MVI   Objective:    BP 111/70   Pulse 70   Ht 5' 4.75" (1.645 m)   Wt 129 lb (58.5 kg)   LMP 07/29/2019 (Exact Date)   BMI 21.63 kg/m   General Appearance:    Alert, cooperative, no distress, appears stated age  Head:    Normocephalic, without obvious abnormality, atraumatic  Eyes:    PERRL, conjunctiva/corneas clear, EOM's intact, fundi    benign, both eyes  Ears:    Normal TM's and external ear canals, both ears  Nose:   Nares normal, septum midline, mucosa normal, no drainage    or sinus tenderness  Throat:   Lips, mucosa, and tongue normal; teeth and gums normal  Neck:   Supple, symmetrical,  trachea midline, no adenopathy;    thyroid:  no enlargement/tenderness/nodules; no carotid   bruit or JVD  Back:     Symmetric, no curvature, ROM normal, no CVA tenderness  Lungs:     Clear to auscultation bilaterally, respirations unlabored  Chest Wall:    No tenderness or deformity   Heart:    Regular rate and rhythm, S1 and S2 normal, no murmur, rub   or gallop  Breast Exam:    No tenderness, masses, or nipple abnormality  Abdomen:     Soft, non-tender, bowel sounds active all four quadrants,    no masses, no organomegaly  Genitalia:    Normal female without lesion, discharge or tenderness, normal size and shape, retroverted, mobile, non-tender, normal adnexal exam      Extremities:   Extremities normal, atraumatic, no cyanosis or edema  Pulses:   2+ and symmetric all extremities  Skin:   Skin color, texture, turgor normal, no rashes or lesions  Lymph nodes:   Cervical, supraclavicular, and axillary nodes normal  Neurologic:   CNII-XII intact, normal strength, sensation and reflexes    throughout  .    Assessment:    Healthy female exam.  Plan:     Thin prep Pap smear. with cotesting Flu vaccine today Rec MVI daily

## 2019-08-14 LAB — CYTOLOGY - PAP
Diagnosis: NEGATIVE
HPV: NOT DETECTED

## 2019-10-19 ENCOUNTER — Other Ambulatory Visit: Payer: Self-pay | Admitting: *Deleted

## 2019-10-19 DIAGNOSIS — Z20822 Contact with and (suspected) exposure to covid-19: Secondary | ICD-10-CM

## 2019-10-21 LAB — NOVEL CORONAVIRUS, NAA: SARS-CoV-2, NAA: NOT DETECTED

## 2019-12-04 ENCOUNTER — Other Ambulatory Visit: Payer: Self-pay

## 2019-12-04 ENCOUNTER — Encounter: Payer: Self-pay | Admitting: Certified Nurse Midwife

## 2019-12-04 ENCOUNTER — Ambulatory Visit (INDEPENDENT_AMBULATORY_CARE_PROVIDER_SITE_OTHER): Payer: BC Managed Care – PPO | Admitting: Certified Nurse Midwife

## 2019-12-04 VITALS — BP 111/68 | HR 83 | Wt 141.0 lb

## 2019-12-04 DIAGNOSIS — Z113 Encounter for screening for infections with a predominantly sexual mode of transmission: Secondary | ICD-10-CM

## 2019-12-04 DIAGNOSIS — Z3481 Encounter for supervision of other normal pregnancy, first trimester: Secondary | ICD-10-CM

## 2019-12-04 DIAGNOSIS — Z3480 Encounter for supervision of other normal pregnancy, unspecified trimester: Secondary | ICD-10-CM

## 2019-12-04 DIAGNOSIS — Z3A10 10 weeks gestation of pregnancy: Secondary | ICD-10-CM

## 2019-12-04 DIAGNOSIS — B373 Candidiasis of vulva and vagina: Secondary | ICD-10-CM

## 2019-12-04 DIAGNOSIS — B3731 Acute candidiasis of vulva and vagina: Secondary | ICD-10-CM

## 2019-12-04 MED ORDER — TERCONAZOLE 0.4 % VA CREA: 1.0000 | TOPICAL_CREAM | Freq: Every day | VAGINAL | 0 refills | Status: DC

## 2019-12-04 NOTE — Progress Notes (Signed)
DATING AND VIABILITY SONOGRAM   Danielle Esparza is a 38 y.o. year old [id number] with LMP Patient's last menstrual period was 09/21/2019 (exact date). which would correlate to  [redacted]w[redacted]d weeks gestation.  She has regular menstrual cycles.   She is here today for a confirmatory initial sonogram.    GESTATION: SINGLETON yes     FETAL ACTIVITY:          Heart rate   173          The fetus is active.    GESTATIONAL AGE AND  BIOMETRICS:  Gestational criteria: Estimated Date of Delivery: 06/27/20 by LMP now at [redacted]w[redacted]d  Previous Scans:0  GESTATIONAL SAC            mm         weeks  CROWN RUMP LENGTH          3.71 mm         10.4 weeks                                                   AVERAGE EGA(BY THIS SCAN):  10.4 weeks  WORKING EDD( LMP ):  06/27/2020     TECHNICIAN COMMENTS: Patient informed that the ultrasound is considered a limited obstetric ultrasound and is not intended to be a complete ultrasound exam. Patient also informed that the ultrasound is not being completed with the intent of assessing for fetal or placental anomalies or any pelvic abnormalities. Explained that the purpose of today's ultrasound is to assess for fetal heart rate. Patient acknowledges the purpose of the exam and the limitations of the study.          A copy of this report including all images has been saved and backed up to a second source for retrieval if needed. All measures and details of the anatomical scan, placentation, fluid volume and pelvic anatomy are contained in that report.  Crosby Oyster 12/04/2019 3:46 PM

## 2019-12-04 NOTE — Progress Notes (Signed)
History:   Danielle Esparza is a 38 y.o. G4W1027 at [redacted]w[redacted]d by LMP being seen today for her first obstetrical visit.  Her obstetrical history is significant for 1 SVD in 2017. Patient does intend to breast feed. Pregnancy history fully reviewed.  Patient reports no complaints.     HISTORY: OB History  Gravida Para Term Preterm AB Living  4 1 1  0 2 1  SAB TAB Ectopic Multiple Live Births  2 0 0 0 1    # Outcome Date GA Lbr Len/2nd Weight Sex Delivery Anes PTL Lv  4 Current           3 SAB 06/27/19          2 SAB 01/2017          1 Term 01/02/16 [redacted]w[redacted]d 12:50 / 00:18 7 lb 5.3 oz (3.325 kg) F Vag-Spont EPI  LIV     Birth Comments: wnl     Name: Salameh,KHARIS LYNN     Apgar1: 8  Apgar5: 9    Obstetric Comments  Menstrual age: 89    Age 1st Pregnancy: N/A    Last pap smear was done 08/2019 and was normal - recently had annual examination in September   Past Medical History:  Diagnosis Date  . Allergy    seasonal  . Hemoglobin A-S genotype/sickle cell trait 05/28/2015  . Nipple discharge 2014   Past Surgical History:  Procedure Laterality Date  . NO PAST SURGERIES     Family History  Problem Relation Age of Onset  . Prostate cancer Father   . Sarcoidosis Father   . Breast cancer Paternal Grandmother   . Diabetes Paternal Grandmother   . Ovarian cancer Maternal Aunt 50  . Diabetes Paternal Grandfather   . Diabetes Maternal Grandmother    Social History   Tobacco Use  . Smoking status: Never Smoker  . Smokeless tobacco: Never Used  Substance Use Topics  . Alcohol use: No  . Drug use: No   No Known Allergies Current Outpatient Medications on File Prior to Visit  Medication Sig Dispense Refill  . Prenatal Vit-Fe Fumarate-FA (MULTIVITAMIN-PRENATAL) 27-0.8 MG TABS tablet Take 1 tablet by mouth daily at 12 noon.    2015 albuterol (VENTOLIN HFA) 108 (90 Base) MCG/ACT inhaler Inhale 2 puffs into the lungs every 6 (six) hours as needed for wheezing or shortness of breath.  (Patient not taking: Reported on 08/13/2019) 8 g 0  . fluticasone (FLONASE) 50 MCG/ACT nasal spray Place 2 sprays into both nostrils daily. (Patient not taking: Reported on 08/13/2019) 48 g 1  . montelukast (SINGULAIR) 10 MG tablet Take 1 tablet (10 mg total) by mouth daily. (Patient not taking: Reported on 08/13/2019) 30 tablet 0  . Norethindrone Acetate-Ethinyl Estrad-FE (LOESTRIN 24 FE) 1-20 MG-MCG(24) tablet Take 1 tablet by mouth daily. (Patient not taking: Reported on 08/13/2019) 1 Package 11   No current facility-administered medications on file prior to visit.    Review of Systems Pertinent items noted in HPI and remainder of comprehensive ROS otherwise negative. Physical Exam:   Vitals:   12/04/19 1524  BP: 111/68  Pulse: 83  Weight: 141 lb (64 kg)     System: General: well-developed, well-nourished female in no acute distress   Skin: normal coloration and turgor, no rashes   Neurologic: oriented, normal, negative, normal mood   Extremities: normal strength, tone, and muscle mass, ROM of all joints is normal   HEENT PERRLA, extraocular movement intact and sclera clear  Mouth/Teeth mucous membranes moist, pharynx normal without lesions and dental hygiene good   Neck supple and no masses   Cardiovascular: regular rate and rhythm   Respiratory:  no respiratory distress, normal breath sounds   Abdomen: soft, non-tender; bowel sounds normal; no masses,  no organomegaly       Assessment:    Pregnancy: [id number] Patient Active Problem List   Diagnosis Date Noted  . Supervision of other normal pregnancy, antepartum 12/04/2019  . Hemoglobin A-S genotype/sickle cell trait 05/28/2015  . Cervical high risk HPV (human papillomavirus) test positive 10/14/2013  . Lump or mass in breast 10/08/2013  . Nipple discharge 06/18/2013     Plan:    1. Supervision of other normal pregnancy, antepartum - Introduced self to patient and congratulated on pregnancy  - Educated and discussed  babyscripts with patient and the use of BP cuff  - Educated on how to take BP and connect cuff to machine with patient  - Anticipatory guidance on upcoming appointments with integration of mychart appointments - Obstetric Panel (and HIV) - Culture, OB Urine - Enroll Patient in Babyscripts - Babyscripts Schedule Optimization - Urine cytology ancillary only([place]) - [place] MFM OB DETAIL +14 WK; Future  2. Vaginal yeast infection - Patient reports having yeast infection at annual appointment in September - was not treated and request treatment as she has continued irritation and itching.  - terconazole (TERAZOL 7) 0.4 % vaginal cream; Place 1 applicator vaginally at bedtime. Use for seven days  Dispense: 45 g; Refill: 0   Initial labs drawn. Continue prenatal vitamins. Genetic Screening discussed, NIPS: undecided. Ultrasound discussed; fetal anatomic survey: ordered. Problem list reviewed and updated. The nature of [place] with multiple MDs and other Advanced Practice Providers was explained to patient; also emphasized that residents, students are part of our team. Routine obstetric precautions reviewed. Return in about 4 weeks (around 01/01/2020) for ROB-mychart.     Lajean Manes, [place] for [employer], [place]

## 2019-12-04 NOTE — Patient Instructions (Signed)
Eating Plan for Pregnant Women While you are pregnant, your body requires additional nutrition to help support your growing baby. You also have a higher need for some vitamins and minerals, such as folic acid, calcium, iron, and vitamin D. Eating a healthy, well-balanced diet is very important for your health and your baby's health. Your need for extra calories varies for the three 76-monthsegments of your pregnancy (trimesters). For most women, it is recommended to consume:  150 extra calories a day during the first trimester.  300 extra calories a day during the second trimester.  300 extra calories a day during the third trimester. What are tips for following this plan?   Do not try to lose weight or go on a diet during pregnancy.  Limit your overall intake of foods that have "empty calories." These are foods that have little nutritional value, such as sweets, desserts, candies, and sugar-sweetened beverages.  Eat a variety of foods (especially fruits and vegetables) to get a full range of vitamins and minerals.  Take a prenatal vitamin to help meet your additional vitamin and mineral needs during pregnancy, specifically for folic acid, iron, calcium, and vitamin D.  Remember to stay active. Ask your health care provider what types of exercise and activities are safe for you.  Practice good food safety and cleanliness. Wash your hands before you eat and after you prepare raw meat. Wash all fruits and vegetables well before peeling or eating. Taking these actions can help to prevent food-borne illnesses that can be very dangerous to your baby, such as listeriosis. Ask your health care provider for more information about listeriosis. What does 150 extra calories look like? Healthy options that provide 150 extra calories each day could be any of the following:  6-8 oz (170-230 g) of plain low-fat yogurt with  cup of berries.  1 apple with 2 teaspoons (11 g) of peanut butter.  Cut-up  vegetables with  cup (60 g) of hummus.  8 oz (230 mL) or 1 cup of low-fat chocolate milk.  1 stick of string cheese with 1 medium orange.  1 peanut butter and jelly sandwich that is made with one slice of whole-wheat bread and 1 tsp (5 g) of peanut butter. For 300 extra calories, you could eat two of those healthy options each day. What is a healthy amount of weight to gain? The right amount of weight gain for you is based on your BMI before you became pregnant. If your BMI:  Was less than 18 (underweight), you should gain 28-40 lb (13-18 kg).  Was 18-24.9 (normal), you should gain 25-35 lb (11-16 kg).  Was 25-29.9 (overweight), you should gain 15-25 lb (7-11 kg).  Was 30 or greater (obese), you should gain 11-20 lb (5-9 kg). What if I am having twins or multiples? Generally, if you are carrying twins or multiples:  You may need to eat 300-600 extra calories a day.  The recommended range for total weight gain is 25-54 lb (11-25 kg), depending on your BMI before pregnancy.  Talk with your health care provider to find out about nutritional needs, weight gain, and exercise that is right for you. What foods can I eat?  Grains All grains. Choose whole grains, such as whole-wheat bread, oatmeal, or brown rice. Vegetables All vegetables. Eat a variety of colors and types of vegetables. Remember to wash your vegetables well before peeling or eating. Fruits All fruits. Eat a variety of colors and types of fruit. Remember to wash  your fruits well before peeling or eating. Meats and other protein foods Lean meats, including chicken, turkey, fish, and lean cuts of beef, veal, or pork. If you eat fish or seafood, choose options that are higher in omega-3 fatty acids and lower in mercury, such as salmon, herring, mussels, trout, sardines, pollock, shrimp, crab, and lobster. Tofu. Tempeh. Beans. Eggs. Peanut butter and other nut butters. Make sure that all meats, poultry, and eggs are cooked to  food-safe temperatures or "well-done." Two or more servings of fish are recommended each week in order to get the most benefits from omega-3 fatty acids that are found in seafood. Choose fish that are lower in mercury. You can find more information online:  www.fda.gov Dairy Pasteurized milk and milk alternatives (such as almond milk). Pasteurized yogurt and pasteurized cheese. Cottage cheese. Sour cream. Beverages Water. Juices that contain 100% fruit juice or vegetable juice. Caffeine-free teas and decaffeinated coffee. Drinks that contain caffeine are okay to drink, but it is better to avoid caffeine. Keep your total caffeine intake to less than 200 mg each day (which is 12 oz or 355 mL of coffee, tea, or soda) or the limit as told by your health care provider. Fats and oils Fats and oils are okay to include in moderation. Sweets and desserts Sweets and desserts are okay to include in moderation. Seasoning and other foods All pasteurized condiments. The items listed above may not be a complete list of recommended foods and beverages. Contact your dietitian for more options. The items listed above may not be a complete list of foods and beverages [you/your child] can eat. Contact a dietitian for more information. What foods are not recommended? Vegetables Raw (unpasteurized) vegetable juices. Fruits Unpasteurized fruit juices. Meats and other protein foods Lunch meats, bologna, hot dogs, or other deli meats. (If you must eat those meats, reheat them until they are steaming hot.) Refrigerated pat, meat spreads from a meat counter, smoked seafood that is found in the refrigerated section of a store. Raw or undercooked meats, poultry, and eggs. Raw fish, such as sushi or sashimi. Fish that have high mercury content, such as tilefish, shark, swordfish, and king mackerel. To learn more about mercury in fish, talk with your health care provider or look for online resources, such  as:  www.fda.gov Dairy Raw (unpasteurized) milk and any foods that have raw milk in them. Soft cheeses, such as feta, queso blanco, queso fresco, Brie, Camembert cheeses, blue-veined cheeses, and Panela cheese (unless it is made with pasteurized milk, which must be stated on the label). Beverages Alcohol. Sugar-sweetened beverages, such as sodas, teas, or energy drinks. Seasoning and other foods Homemade fermented foods and drinks, such as pickles, sauerkraut, or kombucha drinks. (Store-bought pasteurized versions of these are okay.) Salads that are made in a store or deli, such as ham salad, chicken salad, egg salad, tuna salad, and seafood salad. The items listed above may not be a complete list of foods and beverages to avoid. Contact your dietitian for more information. The items listed above may not be a complete list of foods and beverages [you/your child] should avoid. Contact a dietitian for more information. Where to find more information To calculate the number of calories you need based on your height, weight, and activity level, you can use an online calculator such as:  www.choosemyplate.gov/MyPlatePlan To calculate how much weight you should gain during pregnancy, you can use an online pregnancy weight gain calculator such as:  www.choosemyplate.gov/pregnancy-weight-gain-calculator Summary  While you   are pregnant, your body requires additional nutrition to help support your growing baby.  Eat a variety of foods, especially fruits and vegetables to get a full range of vitamins and minerals.  Practice good food safety and cleanliness. Wash your hands before you eat and after you prepare raw meat. Wash all fruits and vegetables well before peeling or eating. Taking these actions can help to prevent food-borne illnesses, such as listeriosis, that can be very dangerous to your baby.  Do not eat raw meat or fish. Do not eat fish that have high mercury content, such as tilefish,  shark, swordfish, and king mackerel. Do not eat unpasteurized (raw) dairy.  Take a prenatal vitamin to help meet your additional vitamin and mineral needs during pregnancy, specifically for folic acid, iron, calcium, and vitamin D. This information is not intended to replace advice given to you by your health care provider. Make sure you discuss any questions you have with your health care provider. Document Released: 09/12/2014 Document Revised: 03/21/2019 Document Reviewed: 08/25/2017 Elsevier Patient Education  [place] of Pregnancy  The first trimester of pregnancy is from week 1 until the end of week 13 (months 1 through 3). During this time, your baby will begin to develop inside you. At 6-8 weeks, the eyes and face are formed, and the heartbeat can be seen on ultrasound. At the end of 12 weeks, all the baby's organs are formed. Prenatal care is all the medical care you receive before the birth of your baby. Make sure you get good prenatal care and follow all of your doctor's instructions. Follow these instructions at home: Medicines  Take over-the-counter and prescription medicines only as told by your doctor. Some medicines are safe and some medicines are not safe during pregnancy.  Take a prenatal vitamin that contains at least 600 micrograms (mcg) of folic acid.  If you have trouble pooping (constipation), take medicine that will make your stool soft (stool softener) if your doctor approves. Eating and drinking   Eat regular, healthy meals.  Your doctor will tell you the amount of weight gain that is right for you.  Avoid raw meat and uncooked cheese.  If you feel sick to your stomach (nauseous) or throw up (vomit): ? Eat 4 or 5 small meals a day instead of 3 large meals. ? Try eating a few soda crackers. ? Drink liquids between meals instead of during meals.  To prevent constipation: ? Eat foods that are high in fiber, like fresh fruits and  vegetables, whole grains, and beans. ? Drink enough fluids to keep your pee (urine) clear or pale yellow. Activity  Exercise only as told by your doctor. Stop exercising if you have cramps or pain in your lower belly (abdomen) or low back.  Do not exercise if it is too hot, too humid, or if you are in a place of great height (high altitude).  Try to avoid standing for long periods of time. Move your legs often if you must stand in one place for a long time.  Avoid heavy lifting.  Wear low-heeled shoes. Sit and stand up straight.  You can have sex unless your doctor tells you not to. Relieving pain and discomfort  Wear a good support bra if your breasts are sore.  Take warm water baths (sitz baths) to soothe pain or discomfort caused by hemorrhoids. Use hemorrhoid cream if your doctor says it is okay.  Rest with your legs raised if you have  leg cramps or low back pain.  If you have puffy, bulging veins (varicose veins) in your legs: ? Wear support hose or compression stockings as told by your doctor. ? Raise (elevate) your feet for 15 minutes, 3-4 times a day. ? Limit salt in your food. Prenatal care  Schedule your prenatal visits by the twelfth week of pregnancy.  Write down your questions. Take them to your prenatal visits.  Keep all your prenatal visits as told by your doctor. This is important. Safety  Wear your seat belt at all times when driving.  Make a list of emergency phone numbers. The list should include numbers for family, friends, the hospital, and police and fire departments. General instructions  Ask your doctor for a referral to a local prenatal class. Begin classes no later than at the start of month 6 of your pregnancy.  Ask for help if you need counseling or if you need help with nutrition. Your doctor can give you advice or tell you where to go for help.  Do not use hot tubs, steam rooms, or saunas.  Do not douche or use tampons or scented sanitary  pads.  Do not cross your legs for long periods of time.  Avoid all herbs and alcohol. Avoid drugs that are not approved by your doctor.  Do not use any tobacco products, including cigarettes, chewing tobacco, and electronic cigarettes. If you need help quitting, ask your doctor. You may get counseling or other support to help you quit.  Avoid cat litter boxes and soil used by cats. These carry germs that can cause birth defects in the baby and can cause a loss of your baby (miscarriage) or stillbirth.  Visit your dentist. At home, brush your teeth with a soft toothbrush. Be gentle when you floss. Contact a doctor if:  You are dizzy.  You have mild cramps or pressure in your lower belly.  You have a nagging pain in your belly area.  You continue to feel sick to your stomach, you throw up, or you have watery poop (diarrhea).  You have a bad smelling fluid coming from your vagina.  You have pain when you pee (urinate).  You have increased puffiness (swelling) in your face, hands, legs, or ankles. Get help right away if:  You have a fever.  You are leaking fluid from your vagina.  You have spotting or bleeding from your vagina.  You have very bad belly cramping or pain.  You gain or lose weight rapidly.  You throw up blood. It may look like coffee grounds.  You are around people who have [place] measles, fifth disease, or chickenpox.  You have a very bad headache.  You have shortness of breath.  You have any kind of trauma, such as from a fall or a car accident. Summary  The first trimester of pregnancy is from week 1 until the end of week 13 (months 1 through 3).  To take care of yourself and your unborn baby, you will need to eat healthy meals, take medicines only if your doctor tells you to do so, and do activities that are safe for you and your baby.  Keep all follow-up visits as told by your doctor. This is important as your doctor will have to ensure that your  baby is healthy and growing well. This information is not intended to replace advice given to you by your health care provider. Make sure you discuss any questions you have with your health care  provider. Document Released: 05/16/2008 Document Revised: 03/21/2019 Document Reviewed: 12/06/2016 Elsevier Patient Education  2020 [employer].

## 2019-12-05 LAB — OBSTETRIC PANEL, INCLUDING HIV
Antibody Screen: NEGATIVE
Basophils Absolute: 0 10*3/uL (ref 0.0–0.2)
Basos: 0 %
EOS (ABSOLUTE): 0.1 10*3/uL (ref 0.0–0.4)
Eos: 1 %
HIV Screen 4th Generation wRfx: NONREACTIVE
Hematocrit: 31.8 % — ABNORMAL LOW (ref 34.0–46.6)
Hemoglobin: 11.2 g/dL (ref 11.1–15.9)
Hepatitis B Surface Ag: NEGATIVE
Immature Grans (Abs): 0 10*3/uL (ref 0.0–0.1)
Immature Granulocytes: 0 %
Lymphocytes Absolute: 1.7 10*3/uL (ref 0.7–3.1)
Lymphs: 27 %
MCH: 31.3 pg (ref 26.6–33.0)
MCHC: 35.2 g/dL (ref 31.5–35.7)
MCV: 89 fL (ref 79–97)
Monocytes Absolute: 0.6 10*3/uL (ref 0.1–0.9)
Monocytes: 9 %
Neutrophils Absolute: 4 10*3/uL (ref 1.4–7.0)
Neutrophils: 63 %
Platelets: 233 10*3/uL (ref 150–450)
RBC: 3.58 x10E6/uL — ABNORMAL LOW (ref 3.77–5.28)
RDW: 13 % (ref 11.7–15.4)
RPR Ser Ql: NONREACTIVE
Rh Factor: POSITIVE
Rubella Antibodies, IGG: 2.66 index (ref >0.99)
WBC: 6.3 10*3/uL (ref 3.4–10.8)

## 2019-12-06 LAB — CULTURE, OB URINE

## 2019-12-06 LAB — URINE CULTURE, OB REFLEX

## 2019-12-10 LAB — URINE CYTOLOGY ANCILLARY ONLY
Chlamydia: NEGATIVE
Comment: NEGATIVE
Comment: NEGATIVE
Comment: NORMAL
Neisseria Gonorrhea: NEGATIVE
Trichomonas: NEGATIVE

## 2019-12-13 NOTE — L&D Delivery Note (Addendum)
Delivery Note/Shoulder Dystocia Note: At 2:46 PM a viable female was delivered via Vaginal, Spontaneous (Presentation: Right Occiput Anterior).  APGAR: 9, 9; weight: 3425 gm .   Placenta status: Spontaneous, Intact.  Cord: 3 vessels with the following complications: None.  Cord pH: NA   Delivery of the head: 06/27/2020  2:45 PM First maneuver: 06/27/2020  2:45 PM, McRoberts Second maneuver: 06/27/2020  2:46 PM, Suprapubic Pressure  Verbal consent: obtained from patient.  Anesthesia: Epidural Episiotomy: None Lacerations: 1st degree;Perineal Suture Repair: 3.0 monocryl Est. Blood Loss (mL): 350  Mom to postpartum.  Baby to Couplet care / Skin to Skin.  Danielle Esparza is a 39 y.o. female [phone] with IUP at [redacted]w[redacted]d admitted for IOL postterm at [redacted]w[redacted]d.  She progressed with augmentation to complete and pushed less than 15 minutes to deliver.  Cord clamping delayed by 5+ minutes at pt request then clamped by CNM and cut by FOB.  Placenta intact and spontaneous, bleeding minimal. 1st degree laceration repaired without difficulty.  Mom and baby stable prior to transfer to postpartum. She plans on breastfeeding. She requests POPs for birth control.  Lelon Mast 06/27/2020, 3:23 PM   Midwife attestation: I was gloved and present for delivery in its entirety and I agree with the above resident's note.   Sharen Counter 06/28/2020, 12:25 PM

## 2020-01-01 ENCOUNTER — Telehealth (INDEPENDENT_AMBULATORY_CARE_PROVIDER_SITE_OTHER): Payer: BC Managed Care – PPO | Admitting: Advanced Practice Midwife

## 2020-01-01 ENCOUNTER — Ambulatory Visit (INDEPENDENT_AMBULATORY_CARE_PROVIDER_SITE_OTHER): Payer: BC Managed Care – PPO

## 2020-01-01 ENCOUNTER — Other Ambulatory Visit: Payer: Self-pay

## 2020-01-01 VITALS — BP 105/56 | HR 77 | Wt 141.0 lb

## 2020-01-01 DIAGNOSIS — N760 Acute vaginitis: Secondary | ICD-10-CM

## 2020-01-01 DIAGNOSIS — O26899 Other specified pregnancy related conditions, unspecified trimester: Secondary | ICD-10-CM

## 2020-01-01 DIAGNOSIS — B9689 Other specified bacterial agents as the cause of diseases classified elsewhere: Secondary | ICD-10-CM | POA: Diagnosis not present

## 2020-01-01 DIAGNOSIS — Z3480 Encounter for supervision of other normal pregnancy, unspecified trimester: Secondary | ICD-10-CM

## 2020-01-01 DIAGNOSIS — R102 Pelvic and perineal pain: Secondary | ICD-10-CM | POA: Diagnosis not present

## 2020-01-01 DIAGNOSIS — O26892 Other specified pregnancy related conditions, second trimester: Secondary | ICD-10-CM

## 2020-01-01 DIAGNOSIS — B373 Candidiasis of vulva and vagina: Secondary | ICD-10-CM | POA: Diagnosis not present

## 2020-01-01 DIAGNOSIS — Z113 Encounter for screening for infections with a predominantly sexual mode of transmission: Secondary | ICD-10-CM | POA: Diagnosis not present

## 2020-01-01 DIAGNOSIS — N898 Other specified noninflammatory disorders of vagina: Secondary | ICD-10-CM

## 2020-01-01 DIAGNOSIS — Z3A14 14 weeks gestation of pregnancy: Secondary | ICD-10-CM

## 2020-01-01 LAB — POCT URINALYSIS DIPSTICK
Glucose, UA: NEGATIVE
Leukocytes, UA: NEGATIVE
Spec Grav, UA: 1.015 (ref 1.010–1.025)
pH, UA: 5 (ref 5.0–8.0)

## 2020-01-01 NOTE — Progress Notes (Signed)
SUBJECTIVE:  39 y.o. female complains of abnormal vaginal discharge for a couple of days.Denies abnormal vaginal bleeding or significant pelvic pain or fever. No UTI symptoms. Denies history of known exposure to STD.  Patient's last menstrual period was 09/21/2019 (exact date).  OBJECTIVE:  She appears well, afebrile. Urine dipstick: normal   ASSESSMENT:  Vaginal Discharge: small amount  Vaginal Odor: none at this time   PLAN:  GC, chlamydia, trichomonas, BVAG, CVAG probe sent to lab. Treatment: To be determined once lab results are received ROV prn if symptoms persist or worsen.

## 2020-01-01 NOTE — Progress Notes (Signed)
I connected with  Danielle Esparza on 01/01/20 at 11:00 AM EST by telephone and verified that I am speaking with the correct person using two identifiers.   I discussed the limitations, risks, security and privacy concerns of performing an evaluation and management service by telephone and the availability of in person appointments. I also discussed with the patient that there may be a patient responsible charge related to this service. The patient expressed understanding and agreed to proceed.  Renee Beale Emeline Darling, CMA 01/01/2020  11:06 AM

## 2020-01-01 NOTE — Progress Notes (Signed)
   TELEHEALTH OBSTETRICS PRENATAL VIRTUAL VIDEO VISIT ENCOUNTER NOTE  Provider location: [facility] for [facility]   I connected with Danielle Esparza on 01/01/20 at 11:00 AM EST by MyChart Video Encounter at home and verified that I am speaking with the correct person using two identifiers.   I discussed the limitations, risks, security and privacy concerns of performing an evaluation and management service virtually and the availability of in person appointments. I also discussed with the patient that there may be a patient responsible charge related to this service. The patient expressed understanding and agreed to proceed. Subjective:  Danielle Esparza is a 39 y.o. [id number] at [redacted]w[redacted]d being seen today for ongoing prenatal care.  She is currently monitored for the following issues for this low-risk pregnancy and has Nipple discharge; Lump or mass in breast; Cervical high risk HPV (human papillomavirus) test positive; Hemoglobin A-S genotype/sickle cell trait; and Supervision of other normal pregnancy, antepartum on their problem list.  Patient reports suprapubic pressure, new onset today. She endorses low back pain but denies dysuria. Her pressure is rated as 4/10, non-radiating and described as "dull". She states she experienced a similar sensation in her previous pregnancy but she was farther along  Contractions: Not present.  .  Movement: Present. Denies any leaking of fluid, denies vaginal bleeding   The following portions of the patient's history were reviewed and updated as appropriate: allergies, current medications, past family history, past medical history, past social history, past surgical history and problem list.   Objective:   Vitals:   01/01/20 1107  BP: (!) 105/56  Pulse: 77  Weight: 141 lb (64 kg)    Fetal Status:     Movement: Present     General:  Alert, oriented and cooperative. Patient is in no acute distress.  Respiratory: Normal respiratory effort, no  problems with respiration noted  Mental Status: Normal mood and affect. Normal behavior. Normal judgment and thought content.  Rest of physical exam deferred due to type of encounter  Imaging: No results found.  Assessment and Plan:  Pregnancy: G4P1021 at [redacted]w[redacted]d 1. Supervision of other normal pregnancy, antepartum - Continue routine care - Reviewed milestones for remaining appointments  2. Suprapubic pain - "Pressure" in absence of dysuria, vaginal bleeding - Patient to come to clinic to self-collect cervicovaginal swab, urine culture  Preterm labor symptoms and general obstetric precautions including but not limited to vaginal bleeding, contractions, leaking of fluid and fetal movement were reviewed in detail with the patient. I discussed the assessment and treatment plan with the patient. The patient was provided an opportunity to ask questions and all were answered. The patient agreed with the plan and demonstrated an understanding of the instructions. The patient was advised to call back or seek an in-person office evaluation/go to [facility] at [facility] for any urgent or concerning symptoms. Please refer to After Visit Summary for other counseling recommendations.   I provided twenty minutes of face-to-face time during this encounter.  Future Appointments  Date Time Provider Department Center  01/29/2020  9:00 AM Calvert Cantor, [place] [facility]  01/31/2020 10:00 AM [facility] NURSE [facility]  01/31/2020 10:00 AM [facility] [place] 3 [facility]    Calvert Cantor, CNM Center for [employer], [facility]

## 2020-01-01 NOTE — Patient Instructions (Signed)

## 2020-01-03 ENCOUNTER — Encounter: Payer: Self-pay | Admitting: Certified Nurse Midwife

## 2020-01-03 LAB — URINE CULTURE, OB REFLEX

## 2020-01-03 LAB — CULTURE, OB URINE

## 2020-01-07 LAB — CERVICOVAGINAL ANCILLARY ONLY
Bacterial Vaginitis (gardnerella): POSITIVE — AB
Candida Glabrata: NEGATIVE
Candida Vaginitis: POSITIVE — AB
Chlamydia: NEGATIVE
Comment: NEGATIVE
Comment: NEGATIVE
Comment: NEGATIVE
Comment: NEGATIVE
Comment: NEGATIVE
Comment: NORMAL
Neisseria Gonorrhea: NEGATIVE
Trichomonas: NEGATIVE

## 2020-01-08 ENCOUNTER — Other Ambulatory Visit: Payer: Self-pay | Admitting: *Deleted

## 2020-01-08 MED ORDER — METRONIDAZOLE 500 MG PO TABS: 500.0000 mg | ORAL_TABLET | Freq: Two times a day (BID) | ORAL | 0 refills | Status: DC

## 2020-01-10 ENCOUNTER — Other Ambulatory Visit: Payer: Self-pay | Admitting: *Deleted

## 2020-01-10 DIAGNOSIS — B373 Candidiasis of vulva and vagina: Secondary | ICD-10-CM

## 2020-01-10 DIAGNOSIS — B3731 Acute candidiasis of vulva and vagina: Secondary | ICD-10-CM

## 2020-01-10 MED ORDER — TERCONAZOLE 0.4 % VA CREA: 1.0000 | TOPICAL_CREAM | Freq: Every day | VAGINAL | 0 refills | Status: DC

## 2020-01-24 ENCOUNTER — Telehealth: Payer: Self-pay | Admitting: Radiology

## 2020-01-24 NOTE — Telephone Encounter (Signed)
Called from [facility] office, stated that patient called wanting to schedule appointment at their office due to not seeing MD at [facility] and being "High Risk" due to previous miscarriage. I explained that she has had only 1 in office visit with midwife and 1 virtual visit. I would be more than happy to change her to MD schedule but patient would need to call the office so that we can schedule this appointment. Elam agreed and would explain this to the patient.

## 2020-01-29 ENCOUNTER — Telehealth: Payer: BC Managed Care – PPO | Admitting: Advanced Practice Midwife

## 2020-01-30 ENCOUNTER — Telehealth: Payer: BC Managed Care – PPO | Admitting: Obstetrics & Gynecology

## 2020-01-31 ENCOUNTER — Ambulatory Visit (HOSPITAL_COMMUNITY)
Admission: RE | Admit: 2020-01-31 | Discharge: 2020-01-31 | Disposition: A | Discharge status: Home / Self Care | Payer: BC Managed Care – PPO | Source: Ambulatory Visit | Attending: Certified Nurse Midwife | Admitting: Certified Nurse Midwife

## 2020-01-31 ENCOUNTER — Other Ambulatory Visit: Payer: Self-pay

## 2020-01-31 ENCOUNTER — Ambulatory Visit (HOSPITAL_COMMUNITY): Payer: BC Managed Care – PPO | Admitting: *Deleted

## 2020-01-31 ENCOUNTER — Encounter (HOSPITAL_COMMUNITY): Payer: Self-pay

## 2020-01-31 VITALS — BP 101/65 | HR 74 | Temp 97.7°F

## 2020-01-31 DIAGNOSIS — Z3A18 18 weeks gestation of pregnancy: Secondary | ICD-10-CM | POA: Diagnosis not present

## 2020-01-31 DIAGNOSIS — Z3480 Encounter for supervision of other normal pregnancy, unspecified trimester: Secondary | ICD-10-CM

## 2020-01-31 DIAGNOSIS — O09522 Supervision of elderly multigravida, second trimester: Secondary | ICD-10-CM

## 2020-02-05 ENCOUNTER — Other Ambulatory Visit: Payer: Self-pay

## 2020-02-05 ENCOUNTER — Telehealth (INDEPENDENT_AMBULATORY_CARE_PROVIDER_SITE_OTHER): Payer: BC Managed Care – PPO | Admitting: Family Medicine

## 2020-02-05 ENCOUNTER — Encounter: Payer: Self-pay | Admitting: Family Medicine

## 2020-02-05 VITALS — BP 100/71

## 2020-02-05 DIAGNOSIS — O98812 Other maternal infectious and parasitic diseases complicating pregnancy, second trimester: Secondary | ICD-10-CM

## 2020-02-05 DIAGNOSIS — Z3480 Encounter for supervision of other normal pregnancy, unspecified trimester: Secondary | ICD-10-CM

## 2020-02-05 DIAGNOSIS — R87810 Cervical high risk human papillomavirus (HPV) DNA test positive: Secondary | ICD-10-CM

## 2020-02-05 DIAGNOSIS — Z3A19 19 weeks gestation of pregnancy: Secondary | ICD-10-CM

## 2020-02-05 DIAGNOSIS — D573 Sickle-cell trait: Secondary | ICD-10-CM

## 2020-02-05 NOTE — Progress Notes (Signed)
I connected with@ on 02/05/20 at  9:00 AM EST by: Mychartand verified that I am speaking with the correct person using two identifiers.  Patient is located at home and provider is located at [employer].     The purpose of this virtual visit is to provide medical care while limiting exposure to the novel coronavirus. I discussed the limitations, risks, security and privacy concerns of performing an evaluation and management service by MyChart and the availability of in person appointments. I also discussed with the patient that there may be a patient responsible charge related to this service. By engaging in this virtual visit, you consent to the provision of healthcare.  Additionally, you authorize for your insurance to be billed for the services provided during this visit.  The patient expressed understanding and agreed to proceed.  The following staff members participated in the virtual visit:  Scheryl Marten RN    PRENATAL VISIT NOTE  Subjective:  Danielle Esparza is a 39 y.o. Q6P6195 at [redacted]w[redacted]d  for phone visit for ongoing prenatal care.  She is currently monitored for the following issues for this high-risk pregnancy and has Nipple discharge; Lump or mass in breast; Cervical high risk HPV (human papillomavirus) test positive; Hemoglobin A-S genotype/sickle cell trait; and Supervision of other normal pregnancy, antepartum on their problem list.  Patient reports no complaints.  Working on an ADA request form. School will return for remote.  [employer] - had 5 children in this class. Bringing back 1/2 of 846. Counseling/consulting.  Last year she had URI- sick for 4 months. She is worried about exposures. High school teacher.   Does not want COVID-19  vaccine   Contractions: Not present. Vag. Bleeding: None.  Movement: Present. Denies leaking of fluid.   The following portions of the patient's history were reviewed and updated as appropriate: allergies, current medications, past family history,  past medical history, past social history, past surgical history and problem list.   Objective:   Vitals:   02/05/20 0857  BP: 100/71   Self-Obtained  Fetal Status:     Movement: Present     Assessment and Plan:  Pregnancy: G4P1021 at [redacted]w[redacted]d  1. 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 Hemoglobin A-S genotype/sickle cell trait  3. Cervical high risk HPV (human papillomavirus) test positive  Preterm labor symptoms and general obstetric precautions including but not limited to vaginal bleeding, contractions, leaking of fluid and fetal movement were reviewed in detail with the patient.  Return in about 4 weeks (around 03/04/2020) for Routine prenatal care, Telehealth/Virtual health OB Visit.  No future appointments.   Time spent on virtual visit: 20 minutes  Federico Flake, MD

## 2020-02-14 ENCOUNTER — Ambulatory Visit: Payer: BC Managed Care – PPO | Attending: Internal Medicine

## 2020-02-14 DIAGNOSIS — Z23 Encounter for immunization: Secondary | ICD-10-CM | POA: Insufficient documentation

## 2020-02-14 NOTE — Progress Notes (Signed)
   Covid-19 Vaccination Clinic  Name:  Danielle Esparza    MRN: [id number] DOB: [birth year]  02/14/2020  [name] was observed post Covid-19 immunization for 15 minutes without incident. She was provided with Vaccine Information Sheet and instruction to access the V-Safe system.   [name] was instructed to call 911 with any severe reactions post vaccine: Marland Kitchen Difficulty breathing  . Swelling of face and throat  . A fast heartbeat  . A bad rash all over body  . Dizziness and weakness

## 2020-03-04 ENCOUNTER — Ambulatory Visit (INDEPENDENT_AMBULATORY_CARE_PROVIDER_SITE_OTHER): Payer: BC Managed Care – PPO | Admitting: Family Medicine

## 2020-03-04 ENCOUNTER — Other Ambulatory Visit: Payer: Self-pay

## 2020-03-04 VITALS — BP 100/65 | HR 74 | Wt 151.0 lb

## 2020-03-04 DIAGNOSIS — D573 Sickle-cell trait: Secondary | ICD-10-CM

## 2020-03-04 DIAGNOSIS — O0992 Supervision of high risk pregnancy, unspecified, second trimester: Secondary | ICD-10-CM

## 2020-03-04 DIAGNOSIS — O99012 Anemia complicating pregnancy, second trimester: Secondary | ICD-10-CM

## 2020-03-04 DIAGNOSIS — Z3A23 23 weeks gestation of pregnancy: Secondary | ICD-10-CM

## 2020-03-04 NOTE — Progress Notes (Signed)
Cramping in back, pressure in lower abd and cervix

## 2020-03-04 NOTE — Progress Notes (Signed)
   PRENATAL VISIT NOTE  Subjective:  Danielle Esparza is a 39 y.o. G4P1021 at [redacted]w[redacted]d being seen today for ongoing prenatal care.  She is currently monitored for the following issues for this high-risk pregnancy and has Nipple discharge; Lump or mass in breast; Cervical high risk HPV (human papillomavirus) test positive; Hemoglobin A-S genotype/sickle cell trait; and Supervision of other normal pregnancy, antepartum on their problem list.  Patient reports cramping and back pain- present once a day for <15 minutes but reports it is 'pretty excruiating"  Contractions: Irritability. Vag. Bleeding: None.  Movement: Present. Denies leaking of fluid.   The following portions of the patient's history were reviewed and updated as appropriate: allergies, current medications, past family history, past medical history, past social history, past surgical history and problem list.   Objective:   Vitals:   03/04/20 1043  BP: 100/65  Pulse: 74  Weight: 151 lb (68.5 kg)    Fetal Status: Fetal Heart Rate (bpm): 130 Fundal Height: 20 cm Movement: Present     General:  Alert, oriented and cooperative. Patient is in no acute distress.  Skin: Skin is warm and dry. No rash noted.   Cardiovascular: Normal heart rate noted  Respiratory: Normal respiratory effort, no problems with respiration noted  Abdomen: Soft, gravid, appropriate for gestational age.  Pain/Pressure: Present     Pelvic: Cervical exam deferred        Extremities: Normal range of motion.     Mental Status: Normal mood and affect. Normal behavior. Normal judgment and thought content.   Assessment and Plan:  Pregnancy: G4P1021 at [redacted]w[redacted]d 1. Supervision of high risk pregnancy in second trimester Up to date Counseled about back health use of massage, chiropractic and yoga to help sx. Overall back pain sounds quite normal and reassured patient.  Reviewed upcoming 28 wk visit  2. Hemoglobin A-S genotype/sickle cell trait   Preterm labor symptoms  and general obstetric precautions including but not limited to vaginal bleeding, contractions, leaking of fluid and fetal movement were reviewed in detail with the patient. Please refer to After Visit Summary for other counseling recommendations.   Return in about 4 weeks (around 04/01/2020) for Routine prenatal care, in person, 28 wk labs.  Future Appointments  Date Time Provider Department Center  03/11/2020 11:15 AM [facility]  04/01/2020  9:00 AM Federico Flake, MD [facility]    Federico Flake, MD

## 2020-03-11 ENCOUNTER — Ambulatory Visit: Payer: BC Managed Care – PPO | Attending: Internal Medicine

## 2020-03-11 DIAGNOSIS — Z23 Encounter for immunization: Secondary | ICD-10-CM

## 2020-03-11 NOTE — Progress Notes (Signed)
   Covid-19 Vaccination Clinic  Name:  Danielle Esparza    MRN: [id number] DOB: December 01, 1981  03/11/2020  [name] was observed post Covid-19 immunization for 15 minutes without incident. She was provided with Vaccine Information Sheet and instruction to access the V-Safe system.   [name] was instructed to call 911 with any severe reactions post vaccine: Marland Kitchen Difficulty breathing  . Swelling of face and throat  . A fast heartbeat  . A bad rash all over body  . Dizziness and weakness   Immunizations Administered    Name Date Dose VIS Date Route   Pfizer COVID-19 Vaccine 03/11/2020 11:18 AM 0.3 mL 11/22/2019 Intramuscular   Manufacturer: [employer], [employer]   Lot: [id number]   NDC: [address]-1

## 2020-04-01 ENCOUNTER — Other Ambulatory Visit: Payer: Self-pay

## 2020-04-01 ENCOUNTER — Ambulatory Visit (INDEPENDENT_AMBULATORY_CARE_PROVIDER_SITE_OTHER): Payer: BC Managed Care – PPO | Admitting: Family Medicine

## 2020-04-01 VITALS — BP 105/70 | HR 80 | Wt 162.0 lb

## 2020-04-01 DIAGNOSIS — Z3A27 27 weeks gestation of pregnancy: Secondary | ICD-10-CM

## 2020-04-01 DIAGNOSIS — Z23 Encounter for immunization: Secondary | ICD-10-CM

## 2020-04-01 DIAGNOSIS — Z3480 Encounter for supervision of other normal pregnancy, unspecified trimester: Secondary | ICD-10-CM

## 2020-04-01 LAB — CBC
Hematocrit: 31.2 % — ABNORMAL LOW (ref 34.0–46.6)
Hemoglobin: 10.9 g/dL — ABNORMAL LOW (ref 11.1–15.9)
MCH: 31.2 pg (ref 26.6–33.0)
MCHC: 34.9 g/dL (ref 31.5–35.7)
MCV: 89 fL (ref 79–97)
Platelets: 211 10*3/uL (ref 150–450)
RBC: 3.49 x10E6/uL — ABNORMAL LOW (ref 3.77–5.28)
RDW: 12.6 % (ref 11.7–15.4)
WBC: 7.3 10*3/uL (ref 3.4–10.8)

## 2020-04-01 NOTE — Progress Notes (Signed)
Having sciatic pain

## 2020-04-01 NOTE — Progress Notes (Signed)
   PRENATAL VISIT NOTE  Subjective:  Danielle Esparza is a 39 y.o. K1S0109 at 108w4d being seen today for ongoing prenatal care.  She is currently monitored for the following issues for this high-risk pregnancy and has Nipple discharge; Lump or mass in breast; Cervical high risk HPV (human papillomavirus) test positive; Hemoglobin A-S genotype/sickle cell trait; and Supervision of other normal pregnancy, antepartum on their problem list.  Patient reports backache.  Contractions: Not present. Vag. Bleeding: None.  Movement: Present. Denies leaking of fluid.   Back pain/Sciatica- intermittent, worse with standing alleviated by rest. Heating pad helps. Ranges 7-10 when standing. No loss of bowel of bladder, no weakness, no falls.  The following portions of the patient's history were reviewed and updated as appropriate: allergies, current medications, past family history, past medical history, past social history, past surgical history and problem list.   Objective:   Vitals:   04/01/20 0843  BP: 105/70  Pulse: 80  Weight: 162 lb (73.5 kg)    Fetal Status: Fetal Heart Rate (bpm): 156   Movement: Present     General:  Alert, oriented and cooperative. Patient is in no acute distress.  Skin: Skin is warm and dry. No rash noted.   Cardiovascular: Normal heart rate noted  Respiratory: Normal respiratory effort, no problems with respiration noted  Abdomen: Soft, gravid, appropriate for gestational age.  Pain/Pressure: Present     Pelvic: Cervical exam deferred        Extremities: Normal range of motion.  Edema: None  Mental Status: Normal mood and affect. Normal behavior. Normal judgment and thought content.   Assessment and Plan:  Pregnancy: G4P1021 at [redacted]w[redacted]d 1. Supervision of other normal pregnancy, antepartum Received both pfizer vaccines Tdap given  Counseled about back pain management  - Glucose Tolerance, 2 Hours w/1 Hour - RPR - HIV Antibody (routine testing w rflx) - CBC  Preterm  labor symptoms and general obstetric precautions including but not limited to vaginal bleeding, contractions, leaking of fluid and fetal movement were reviewed in detail with the patient. Please refer to After Visit Summary for other counseling recommendations.   Return in about 2 weeks (around 04/15/2020) for Telehealth/Virtual health OB Visit, Routine prenatal care.  Future Appointments  Date Time Provider Department Center  04/16/2020  9:45 AM Anyanwu, Jethro Bastos, MD [facility]    Federico Flake, MD

## 2020-04-02 LAB — GLUCOSE TOLERANCE, 2 HOURS W/ 1HR
Glucose, 1 hour: 143 mg/dL (ref 65–179)
Glucose, 2 hour: 106 mg/dL (ref 65–152)
Glucose, Fasting: 73 mg/dL (ref 65–91)

## 2020-04-02 LAB — HIV ANTIBODY (ROUTINE TESTING W REFLEX): HIV Screen 4th Generation wRfx: NONREACTIVE

## 2020-04-02 LAB — RPR: RPR Ser Ql: NONREACTIVE

## 2020-04-03 ENCOUNTER — Encounter: Payer: Self-pay | Admitting: Family Medicine

## 2020-04-03 DIAGNOSIS — Z3480 Encounter for supervision of other normal pregnancy, unspecified trimester: Secondary | ICD-10-CM

## 2020-04-16 ENCOUNTER — Other Ambulatory Visit: Payer: Self-pay

## 2020-04-16 ENCOUNTER — Encounter: Payer: Self-pay | Admitting: Obstetrics & Gynecology

## 2020-04-16 ENCOUNTER — Encounter: Payer: Self-pay | Admitting: Radiology

## 2020-04-16 ENCOUNTER — Telehealth (INDEPENDENT_AMBULATORY_CARE_PROVIDER_SITE_OTHER): Payer: BC Managed Care – PPO | Admitting: Obstetrics & Gynecology

## 2020-04-16 DIAGNOSIS — Z3480 Encounter for supervision of other normal pregnancy, unspecified trimester: Secondary | ICD-10-CM

## 2020-04-16 NOTE — Patient Instructions (Addendum)
Return to office for any scheduled appointments. Call the office or go to the MAU at [facility] if:  You begin to have strong, frequent contractions  Your water breaks.  Sometimes it is a big gush of fluid, sometimes it is just a trickle that keeps getting your panties wet or running down your legs  You have vaginal bleeding.  It is normal to have a small amount of spotting if your cervix was checked.   You do not feel your baby moving like normal.  If you do not, get something to eat and drink and lay down and focus on feeling your baby move.   If your baby is still not moving like normal, you should call the office or go to MAU.  Any other obstetric concerns.   Contraception Choices Contraception, also called birth control, refers to methods or devices that prevent pregnancy. Hormonal methods Contraceptive implant  A contraceptive implant is a thin, plastic tube that contains a hormone. It is inserted into the upper part of the arm. It can remain in place for up to 3 years. Progestin-only injections Progestin-only injections are injections of progestin, a synthetic form of the hormone progesterone. They are given every 3 months by a health care provider. Birth control pills  Birth control pills are pills that contain hormones that prevent pregnancy. They must be taken once a day, preferably at the same time each day. Birth control patch  The birth control patch contains hormones that prevent pregnancy. It is placed on the skin and must be changed once a week for three weeks and removed on the fourth week. A prescription is needed to use this method of contraception. Vaginal ring  A vaginal ring contains hormones that prevent pregnancy. It is placed in the vagina for three weeks and removed on the fourth week. After that, the process is repeated with a new ring. A prescription is needed to use this method of contraception. Emergency  contraceptive Emergency contraceptives prevent pregnancy after unprotected sex. They come in pill form and can be taken up to 5 days after sex. They work best the sooner they are taken after having sex. Most emergency contraceptives are available without a prescription. This method should not be used as your only form of birth control. Barrier methods Female condom  A female condom is a thin sheath that is worn over the penis during sex. Condoms keep sperm from going inside a woman's body. They can be used with a spermicide to increase their effectiveness. They should be disposed after a single use. Female condom  A female condom is a soft, loose-fitting sheath that is put into the vagina before sex. The condom keeps sperm from going inside a woman's body. They should be disposed after a single use. Diaphragm  A diaphragm is a soft, dome-shaped barrier. It is inserted into the vagina before sex, along with a spermicide. The diaphragm blocks sperm from entering the uterus, and the spermicide kills sperm. A diaphragm should be left in the vagina for 6-8 hours after sex and removed within 24 hours. A diaphragm is prescribed and fitted by a health care provider. A diaphragm should be replaced every 1-2 years, after giving birth, after gaining more than 15 lb (6.8 kg), and after pelvic surgery. Cervical cap  A cervical cap is a round, soft latex or plastic cup that fits over the cervix. It is inserted into the vagina before sex, along with spermicide. It blocks  sperm from entering the uterus. The cap should be left in place for 6-8 hours after sex and removed within 48 hours. A cervical cap must be prescribed and fitted by a health care provider. It should be replaced every 2 years. Sponge  A sponge is a soft, circular piece of polyurethane foam with spermicide on it. The sponge helps block sperm from entering the uterus, and the spermicide kills sperm. To use it, you make it wet and then insert it into the  vagina. It should be inserted before sex, left in for at least 6 hours after sex, and removed and thrown away within 30 hours. Spermicides Spermicides are chemicals that kill or block sperm from entering the cervix and uterus. They can come as a cream, jelly, suppository, foam, or tablet. A spermicide should be inserted into the vagina with an applicator at least 10-15 minutes before sex to allow time for it to work. The process must be repeated every time you have sex. Spermicides do not require a prescription. Intrauterine contraception Intrauterine device (IUD) An IUD is a T-shaped device that is put in a woman's uterus. There are two types:  Hormone IUD.This type contains progestin, a synthetic form of the hormone progesterone. This type can stay in place for 3-5 years.  Copper IUD.This type is wrapped in copper wire. It can stay in place for 10 years.  Permanent methods of contraception Female tubal ligation In this method, a woman's fallopian tubes are sealed, tied, or blocked during surgery to prevent eggs from traveling to the uterus. Hysteroscopic sterilization In this method, a small, flexible insert is placed into each fallopian tube. The inserts cause scar tissue to form in the fallopian tubes and block them, so sperm cannot reach an egg. The procedure takes about 3 months to be effective. Another form of birth control must be used during those 3 months. Female sterilization This is a procedure to tie off the tubes that carry sperm (vasectomy). After the procedure, the man can still ejaculate fluid (semen). Natural planning methods Natural family planning In this method, a couple does not have sex on days when the woman could become pregnant. Calendar method This means keeping track of the length of each menstrual cycle, identifying the days when pregnancy can happen, and not having sex on those days. Ovulation method In this method, a couple avoids sex during  ovulation. Symptothermal method This method involves not having sex during ovulation. The woman typically checks for ovulation by watching changes in her temperature and in the consistency of cervical mucus. Post-ovulation method In this method, a couple waits to have sex until after ovulation. Summary  Contraception, also called birth control, means methods or devices that prevent pregnancy.  Hormonal methods of contraception include implants, injections, pills, patches, vaginal rings, and emergency contraceptives.  Barrier methods of contraception can include female condoms, female condoms, diaphragms, cervical caps, sponges, and spermicides.  There are two types of IUDs (intrauterine devices). An IUD can be put in a woman's uterus to prevent pregnancy for 3-5 years.  Permanent sterilization can be done through a procedure for males, females, or both.  Natural family planning methods involve not having sex on days when the woman could become pregnant. This information is not intended to replace advice given to you by your health care provider. Make sure you discuss any questions you have with your health care provider. Document Revised: 11/30/2017 Document Reviewed: 12/31/2016 Elsevier Patient Education  2020 [employer]    Third  Trimester of Pregnancy The third trimester is from week 28 through week 40 (months 7 through 9). The third trimester is a time when the unborn baby (fetus) is growing rapidly. At the end of the ninth month, the fetus is about 20 inches in length and weighs 6-10 pounds. Body changes during your third trimester Your body will continue to go through many changes during pregnancy. The changes vary from woman to woman. During the third trimester:  Your weight will continue to increase. You can expect to gain 25-35 pounds (11-16 kg) by the end of the pregnancy.  You may begin to get stretch marks on your hips, abdomen, and breasts.  You may urinate more often  because the fetus is moving lower into your pelvis and pressing on your bladder.  You may develop or continue to have heartburn. This is caused by increased hormones that slow down muscles in the digestive tract.  You may develop or continue to have constipation because increased hormones slow digestion and cause the muscles that push waste through your intestines to relax.  You may develop hemorrhoids. These are swollen veins (varicose veins) in the rectum that can itch or be painful.  You may develop swollen, bulging veins (varicose veins) in your legs.  You may have increased body aches in the pelvis, back, or thighs. This is due to weight gain and increased hormones that are relaxing your joints.  You may have changes in your hair. These can include thickening of your hair, rapid growth, and changes in texture. Some women also have hair loss during or after pregnancy, or hair that feels dry or thin. Your hair will most likely return to normal after your baby is born.  Your breasts will continue to grow and they will continue to become tender. A yellow fluid (colostrum) may leak from your breasts. This is the first milk you are producing for your baby.  Your belly button may stick out.  You may notice more swelling in your hands, face, or ankles.  You may have increased tingling or numbness in your hands, arms, and legs. The skin on your belly may also feel numb.  You may feel short of breath because of your expanding uterus.  You may have more problems sleeping. This can be caused by the size of your belly, increased need to urinate, and an increase in your body's metabolism.  You may notice the fetus "dropping," or moving lower in your abdomen (lightening).  You may have increased vaginal discharge.  You may notice your joints feel loose and you may have pain around your pelvic bone. What to expect at prenatal visits You will have prenatal exams every 2 weeks until week 36. Then  you will have weekly prenatal exams. During a routine prenatal visit:  You will be weighed to make sure you and the baby are growing normally.  Your blood pressure will be taken.  Your abdomen will be measured to track your baby's growth.  The fetal heartbeat will be listened to.  Any test results from the previous visit will be discussed.  You may have a cervical check near your due date to see if your cervix has softened or thinned (effaced).  You will be tested for Group B streptococcus. This happens between 35 and 37 weeks. Your health care provider may ask you:  What your birth plan is.  How you are feeling.  If you are feeling the baby move.  If you have had any abnormal  symptoms, such as leaking fluid, bleeding, severe headaches, or abdominal cramping.  If you are using any tobacco products, including cigarettes, chewing tobacco, and electronic cigarettes.  If you have any questions. Other tests or screenings that may be performed during your third trimester include:  Blood tests that check for low iron levels (anemia).  Fetal testing to check the health, activity level, and growth of the fetus. Testing is done if you have certain medical conditions or if there are problems during the pregnancy.  Nonstress test (NST). This test checks the health of your baby to make sure there are no signs of problems, such as the baby not getting enough oxygen. During this test, a belt is placed around your belly. The baby is made to move, and its heart rate is monitored during movement. What is false labor? False labor is a condition in which you feel small, irregular tightenings of the muscles in the womb (contractions) that usually go away with rest, changing position, or drinking water. These are called Braxton Hicks contractions. Contractions may last for hours, days, or even weeks before true labor sets in. If contractions come at regular intervals, become more frequent, increase in  intensity, or become painful, you should see your health care provider. What are the signs of labor?  Abdominal cramps.  Regular contractions that start at 10 minutes apart and become stronger and more frequent with time.  Contractions that start on the top of the uterus and spread down to the lower abdomen and back.  Increased pelvic pressure and dull back pain.  A watery or bloody mucus discharge that comes from the vagina.  Leaking of amniotic fluid. This is also known as your "water breaking." It could be a slow trickle or a gush. Let your health care provider know if it has a color or strange odor. If you have any of these signs, call your health care provider right away, even if it is before your due date. Follow these instructions at home: Medicines  Follow your health care provider's instructions regarding medicine use. Specific medicines may be either safe or unsafe to take during pregnancy.  Take a prenatal vitamin that contains at least 600 micrograms (mcg) of folic acid.  If you develop constipation, try taking a stool softener if your health care provider approves. Eating and drinking   Eat a balanced diet that includes fresh fruits and vegetables, whole grains, good sources of protein such as meat, eggs, or tofu, and low-fat dairy. Your health care provider will help you determine the amount of weight gain that is right for you.  Avoid raw meat and uncooked cheese. These carry germs that can cause birth defects in the baby.  If you have low calcium intake from food, talk to your health care provider about whether you should take a daily calcium supplement.  Eat four or five small meals rather than three large meals a day.  Limit foods that are high in fat and processed sugars, such as fried and sweet foods.  To prevent constipation: ? Drink enough fluid to keep your urine clear or pale yellow. ? Eat foods that are high in fiber, such as fresh fruits and vegetables,  whole grains, and beans. Activity  Exercise only as directed by your health care provider. Most women can continue their usual exercise routine during pregnancy. Try to exercise for 30 minutes at least 5 days a week. Stop exercising if you experience uterine contractions.  Avoid heavy lifting.  Do  not exercise in extreme heat or humidity, or at high altitudes.  Wear low-heel, comfortable shoes.  Practice good posture.  You may continue to have sex unless your health care provider tells you otherwise. Relieving pain and discomfort  Take frequent breaks and rest with your legs elevated if you have leg cramps or low back pain.  Take warm sitz baths to soothe any pain or discomfort caused by hemorrhoids. Use hemorrhoid cream if your health care provider approves.  Wear a good support bra to prevent discomfort from breast tenderness.  If you develop varicose veins: ? Wear support pantyhose or compression stockings as told by your healthcare provider. ? Elevate your feet for 15 minutes, 3-4 times a day. Prenatal care  Write down your questions. Take them to your prenatal visits.  Keep all your prenatal visits as told by your health care provider. This is important. Safety  Wear your seat belt at all times when driving.  Make a list of emergency phone numbers, including numbers for family, friends, the hospital, and police and fire departments. General instructions  Avoid cat litter boxes and soil used by cats. These carry germs that can cause birth defects in the baby. If you have a cat, ask someone to clean the litter box for you.  Do not travel far distances unless it is absolutely necessary and only with the approval of your health care provider.  Do not use hot tubs, steam rooms, or saunas.  Do not drink alcohol.  Do not use any products that contain nicotine or tobacco, such as cigarettes and e-cigarettes. If you need help quitting, ask your health care provider.  Do not  use any medicinal herbs or unprescribed drugs. These chemicals affect the formation and growth of the baby.  Do not douche or use tampons or scented sanitary pads.  Do not cross your legs for long periods of time.  To prepare for the arrival of your baby: ? Take prenatal classes to understand, practice, and ask questions about labor and delivery. ? Make a trial run to the hospital. ? Visit the hospital and tour the maternity area. ? Arrange for maternity or paternity leave through employers. ? Arrange for family and friends to take care of pets while you are in the hospital. ? Purchase a rear-facing car seat and make sure you know how to install it in your car. ? Pack your hospital bag. ? Prepare the baby's nursery. Make sure to remove all pillows and stuffed animals from the baby's crib to prevent suffocation.  Visit your dentist if you have not gone during your pregnancy. Use a soft toothbrush to brush your teeth and be gentle when you floss. Contact a health care provider if:  You are unsure if you are in labor or if your water has broken.  You become dizzy.  You have mild pelvic cramps, pelvic pressure, or nagging pain in your abdominal area.  You have lower back pain.  You have persistent nausea, vomiting, or diarrhea.  You have an unusual or bad smelling vaginal discharge.  You have pain when you urinate. Get help right away if:  Your water breaks before 37 weeks.  You have regular contractions less than 5 minutes apart before 37 weeks.  You have a fever.  You are leaking fluid from your vagina.  You have spotting or bleeding from your vagina.  You have severe abdominal pain or cramping.  You have rapid weight loss or weight gain.  You have shortness  of breath with chest pain.  You notice sudden or extreme swelling of your face, hands, ankles, feet, or legs.  Your baby makes fewer than 10 movements in 2 hours.  You have severe headaches that do not go away  when you take medicine.  You have vision changes. Summary  The third trimester is from week 28 through week 40, months 7 through 9. The third trimester is a time when the unborn baby (fetus) is growing rapidly.  During the third trimester, your discomfort may increase as you and your baby continue to gain weight. You may have abdominal, leg, and back pain, sleeping problems, and an increased need to urinate.  During the third trimester your breasts will keep growing and they will continue to become tender. A yellow fluid (colostrum) may leak from your breasts. This is the first milk you are producing for your baby.  False labor is a condition in which you feel small, irregular tightenings of the muscles in the womb (contractions) that eventually go away. These are called Braxton Hicks contractions. Contractions may last for hours, days, or even weeks before true labor sets in.  Signs of labor can include: abdominal cramps; regular contractions that start at 10 minutes apart and become stronger and more frequent with time; watery or bloody mucus discharge that comes from the vagina; increased pelvic pressure and dull back pain; and leaking of amniotic fluid. This information is not intended to replace advice given to you by your health care provider. Make sure you discuss any questions you have with your health care provider. Document Revised: 03/21/2019 Document Reviewed: 01/03/2017 Elsevier Patient Education  2020 [employer].

## 2020-04-16 NOTE — Progress Notes (Signed)
   OBSTETRICS PRENATAL VIRTUAL VISIT ENCOUNTER NOTE  Provider location: Center for [facility]   I connected with Danielle Esparza on 04/16/20 at  9:45 AM EDT by MyChart Video Encounter at home and verified that I am speaking with the correct person using two identifiers.   I discussed the limitations, risks, security and privacy concerns of performing an evaluation and management service virtually and the availability of in person appointments. I also discussed with the patient that there may be a patient responsible charge related to this service. The patient expressed understanding and agreed to proceed. Subjective:  Danielle Esparza is a 39 y.o. G4P1021 at [redacted]w[redacted]d being seen today for ongoing prenatal care.  She is currently monitored for the following issues for this low-risk pregnancy and has Nipple discharge; Lump or mass in breast; Cervical high risk HPV (human papillomavirus) test positive; Hemoglobin A-S genotype/sickle cell trait; and Supervision of other normal pregnancy, antepartum on their problem list.  Patient reports pain around pubic symphysis area.   Contractions: Not present.  .  Movement: Present. Denies any leaking of fluid.   The following portions of the patient's history were reviewed and updated as appropriate: allergies, current medications, past family history, past medical history, past social history, past surgical history and problem list.   Objective:   Vitals:   04/16/20 0953  BP: 103/71  Weight: 164 lb (74.4 kg)   Fetal Status:     Movement: Present     General:  Alert, oriented and cooperative. Patient is in no acute distress.  Respiratory: Normal respiratory effort, no problems with respiration noted  Mental Status: Normal mood and affect. Normal behavior. Normal judgment and thought content.  Rest of physical exam deferred due to type of encounter  Assessment and Plan:  Pregnancy: G4P1021 at [redacted]w[redacted]d 1. Supervision of other normal  pregnancy, antepartum Normal third trimester labs, reviewed with patient. Reassured that pubic symphysis pain can be seen in pregnancy.  Also has sciatic nerve pain. Will see massage therapist and chiropractor soon, letter will be provided to her to say this is okay in pregnancy.  Preterm labor symptoms and general obstetric precautions including but not limited to vaginal bleeding, contractions, leaking of fluid and fetal movement were reviewed in detail with the patient. I discussed the assessment and treatment plan with the patient. The patient was provided an opportunity to ask questions and all were answered. The patient agreed with the plan and demonstrated an understanding of the instructions. The patient was advised to call back or seek an in-person office evaluation/go to MAU at [facility] for any urgent or concerning symptoms. Please refer to After Visit Summary for other counseling recommendations.   I provided 15 minutes of face-to-face time during this encounter.  Return in about 2 weeks (around 04/30/2020) for OFFICE OB Visit.  Future Appointments  Date Time Provider Department Center  04/30/2020  9:30 AM Grant Bing, MD [facility]    Jaynie Collins, MD Center for [facility]

## 2020-04-30 ENCOUNTER — Other Ambulatory Visit: Payer: Self-pay

## 2020-04-30 ENCOUNTER — Ambulatory Visit (INDEPENDENT_AMBULATORY_CARE_PROVIDER_SITE_OTHER): Payer: BC Managed Care – PPO | Admitting: Obstetrics and Gynecology

## 2020-04-30 VITALS — BP 127/85 | HR 94 | Wt 166.0 lb

## 2020-04-30 DIAGNOSIS — Z3480 Encounter for supervision of other normal pregnancy, unspecified trimester: Secondary | ICD-10-CM

## 2020-04-30 NOTE — Progress Notes (Signed)
Prenatal Visit Note Date: 04/30/2020 Clinic: [facility]  Subjective:  Danielle Esparza is a 39 y.o. [phone] at [redacted]w[redacted]d being seen today for ongoing prenatal care.  She is currently monitored for the following issues for this low-risk pregnancy and has Nipple discharge; Lump or mass in breast; Cervical high risk HPV (human papillomavirus) test positive; Hemoglobin A-S genotype/sickle cell trait; and Supervision of other normal pregnancy, antepartum on their problem list.  Patient reports occasional low belly pressure.   Contractions: Not present. Vag. Bleeding: None.  Movement: Present. Denies leaking of fluid.   The following portions of the patient's history were reviewed and updated as appropriate: allergies, current medications, past family history, past medical history, past social history, past surgical history and problem list. Problem list updated.  Objective:   Vitals:   04/30/20 0935  BP: 127/85  Pulse: 94  Weight: 166 lb (75.3 kg)    Fetal Status: Fetal Heart Rate (bpm): 150 Fundal Height: 31 cm Movement: Present     General:  Alert, oriented and cooperative. Patient is in no acute distress.  Skin: Skin is warm and dry. No rash noted.   Cardiovascular: Normal heart rate noted  Respiratory: Normal respiratory effort, no problems with respiration noted  Abdomen: Soft, gravid, appropriate for gestational age. Pain/Pressure: Present     Pelvic:  Cervical exam deferred        Extremities: Normal range of motion.  Edema: None  Mental Status: Normal mood and affect. Normal behavior. Normal judgment and thought content.   Urinalysis:      Assessment and Plan:  Pregnancy: G4P1021 at [redacted]w[redacted]d  1. Supervision of other normal pregnancy, antepartum Routine care. Pt already has belly binder  Preterm labor symptoms and general obstetric precautions including but not limited to vaginal bleeding, contractions, leaking of fluid and fetal movement were reviewed in detail  with the patient. Please refer to After Visit Summary for other counseling recommendations.  Return in about 2 weeks (around 05/14/2020) for low risk, virtual visit, in person.   [place] Bing, MD

## 2020-05-14 ENCOUNTER — Other Ambulatory Visit: Payer: Self-pay

## 2020-05-14 ENCOUNTER — Telehealth (INDEPENDENT_AMBULATORY_CARE_PROVIDER_SITE_OTHER): Payer: BC Managed Care – PPO | Admitting: Obstetrics and Gynecology

## 2020-05-14 VITALS — BP 107/70 | Wt 168.0 lb

## 2020-05-14 DIAGNOSIS — O09523 Supervision of elderly multigravida, third trimester: Secondary | ICD-10-CM

## 2020-05-14 DIAGNOSIS — Z3480 Encounter for supervision of other normal pregnancy, unspecified trimester: Secondary | ICD-10-CM

## 2020-05-14 DIAGNOSIS — Z3A33 33 weeks gestation of pregnancy: Secondary | ICD-10-CM

## 2020-05-14 DIAGNOSIS — O09529 Supervision of elderly multigravida, unspecified trimester: Secondary | ICD-10-CM | POA: Insufficient documentation

## 2020-05-14 DIAGNOSIS — M5430 Sciatica, unspecified side: Secondary | ICD-10-CM

## 2020-05-14 NOTE — Progress Notes (Signed)
I connected with  Danielle Esparza on 05/14/20 at 10:15 AM EDT by telephone and verified that I am speaking with the correct person using two identifiers.   I discussed the limitations, risks, security and privacy concerns of performing an evaluation and management service by telephone and the availability of in person appointments. I also discussed with the patient that there may be a patient responsible charge related to this service. The patient expressed understanding and agreed to proceed.  Scheryl Marten, RN 05/14/2020  10:16 AM

## 2020-05-14 NOTE — Progress Notes (Signed)
° °  TELEHEALTH VIRTUAL OBSTETRICS VISIT ENCOUNTER NOTE  Clinic: [facility]  I connected with Danielle Esparza on 05/14/20 at 10:15 AM EDT by telephone at home and verified that I am speaking with the correct person using two identifiers.   I discussed the limitations, risks, security and privacy concerns of performing an evaluation and management service by telephone and the availability of in person appointments. I also discussed with the patient that there may be a patient responsible charge related to this service. The patient expressed understanding and agreed to proceed.  Subjective:  Danielle Esparza is a 39 y.o. G4P1021 at [redacted]w[redacted]d being followed for ongoing prenatal care.  She is currently monitored for the following issues for this high-risk pregnancy and has Nipple discharge; Lump or mass in breast; Cervical high risk HPV (human papillomavirus) test positive; Hemoglobin A-S genotype/sickle cell trait; Supervision of other normal pregnancy, antepartum; and AMA (advanced maternal age) multigravida 35+ on their problem list.  Patient reports occasional contractions. Reports fetal movement. Denies any contractions, bleeding or leaking of fluid.   The following portions of the patient's history were reviewed and updated as appropriate: allergies, current medications, past family history, past medical history, past social history, past surgical history and problem list.   Objective:   Vitals:   05/14/20 1015  BP: 107/70  Weight: 168 lb (76.2 kg)    Babyscripts Data Reviewed: yes  General:  Alert, oriented and cooperative.   Mental Status: Normal mood and affect perceived. Normal judgment and thought content.  Rest of physical exam deferred due to type of encounter  Assessment and Plan:  Pregnancy: G4P1021 at [redacted]w[redacted]d 1. Supervision of other normal pregnancy, antepartum Routine care. gbs nv  2. Multigravida of advanced maternal age in third trimester No issues  3.  Sciatica, unspecified laterality Going to chiropractor and doing well   Preterm labor symptoms and general obstetric precautions including but not limited to vaginal bleeding, contractions, leaking of fluid and fetal movement were reviewed in detail with the patient.  I discussed the assessment and treatment plan with the patient. The patient was provided an opportunity to ask questions and all were answered. The patient agreed with the plan and demonstrated an understanding of the instructions. The patient was advised to call back or seek an in-person office evaluation/go to MAU at [facility] for any urgent or concerning symptoms. Please refer to After Visit Summary for other counseling recommendations.   I provided 7 minutes of non-face-to-face time during this encounter. The visit was conducted via MyChart-medicine  No follow-ups on file.  Future Appointments  Date Time Provider Department Center  06/02/2020  9:30 AM Anyanwu, Jethro Bastos, MD [facility]    Ashe Bing, MD Center for [facility], [facility]

## 2020-06-02 ENCOUNTER — Ambulatory Visit (INDEPENDENT_AMBULATORY_CARE_PROVIDER_SITE_OTHER): Payer: BC Managed Care – PPO | Admitting: Obstetrics & Gynecology

## 2020-06-02 ENCOUNTER — Encounter: Payer: Self-pay | Admitting: Radiology

## 2020-06-02 ENCOUNTER — Other Ambulatory Visit (HOSPITAL_COMMUNITY)
Admission: RE | Admit: 2020-06-02 | Discharge: 2020-06-02 | Disposition: A | Discharge status: Home / Self Care | Payer: BC Managed Care – PPO | Source: Ambulatory Visit | Attending: Obstetrics & Gynecology | Admitting: Obstetrics & Gynecology

## 2020-06-02 ENCOUNTER — Other Ambulatory Visit: Payer: Self-pay

## 2020-06-02 ENCOUNTER — Encounter: Payer: Self-pay | Admitting: Obstetrics & Gynecology

## 2020-06-02 VITALS — BP 109/70 | HR 88 | Wt 170.0 lb

## 2020-06-02 DIAGNOSIS — Z3480 Encounter for supervision of other normal pregnancy, unspecified trimester: Secondary | ICD-10-CM

## 2020-06-02 DIAGNOSIS — O09523 Supervision of elderly multigravida, third trimester: Secondary | ICD-10-CM

## 2020-06-02 DIAGNOSIS — Z3A36 36 weeks gestation of pregnancy: Secondary | ICD-10-CM

## 2020-06-02 NOTE — Patient Instructions (Signed)
Return to office for any scheduled appointments. Call the office or go to the MAU at [facility] if:  You begin to have strong, frequent contractions  Your water breaks.  Sometimes it is a big gush of fluid, sometimes it is just a trickle that keeps getting your panties wet or running down your legs  You have vaginal bleeding.  It is normal to have a small amount of spotting if your cervix was checked.   You do not feel your baby moving like normal.  If you do not, get something to eat and drink and lay down and focus on feeling your baby move.   If your baby is still not moving like normal, you should call the office or go to MAU.  Any other obstetric concerns.   

## 2020-06-02 NOTE — Progress Notes (Signed)
   PRENATAL VISIT NOTE  Subjective:  Danielle Esparza is a 39 y.o. [id number] at [redacted]w[redacted]d being seen today for ongoing prenatal care.  She is currently monitored for the following issues for this low-risk pregnancy and has Nipple discharge; Lump or mass in breast; Cervical high risk HPV (human papillomavirus) test positive; Hemoglobin A-S genotype/sickle cell trait; Supervision of other normal pregnancy, antepartum; and AMA (advanced maternal age) multigravida 35+ on their problem list.  Patient reports no complaints.  Contractions: Irregular. Vag. Bleeding: None.  Movement: Present. Denies leaking of fluid.   The following portions of the patient's history were reviewed and updated as appropriate: allergies, current medications, past family history, past medical history, past social history, past surgical history and problem list.   Objective:   Vitals:   06/02/20 0930  BP: 109/70  Pulse: 88  Weight: 170 lb (77.1 kg)    Fetal Status: Fetal Heart Rate (bpm): 155 Fundal Height: 36 cm Movement: Present  Presentation: Vertex (confirmed on clinic ultrasound)  General:  Alert, oriented and cooperative. Patient is in no acute distress.  Skin: Skin is warm and dry. No rash noted.   Cardiovascular: Normal heart rate noted  Respiratory: Normal respiratory effort, no problems with respiration noted  Abdomen: Soft, gravid, appropriate for gestational age.  Pain/Pressure: Present     Pelvic: Cervical exam performed in the presence of a chaperone Dilation: Fingertip Effacement (%): Thick Station: Ballotable  Extremities: Normal range of motion.  Edema: None  Mental Status: Normal mood and affect. Normal behavior. Normal judgment and thought content.   Assessment and Plan:  Pregnancy: G4P1021 at [redacted]w[redacted]d 1. [redacted] weeks gestation of pregnancy 2. Multigravida of advanced maternal age in third trimester 3. Supervision of other normal pregnancy, antepartum Pelvic cultures done today.  - Culture, beta strep  (group b only) - GC/Chlamydia probe amp ([place])not at [facility] Preterm labor symptoms and general obstetric precautions including but not limited to vaginal bleeding, contractions, leaking of fluid and fetal movement were reviewed in detail with the patient. Please refer to After Visit Summary for other counseling recommendations.   Return in about 1 week (around 06/09/2020) for Virtual OB Visit// 2 weeks: OFFICE OB Visit.  Future Appointments  Date Time Provider Department Center  06/10/2020 10:15 AM Federico Flake, MD [facility]    Jaynie Collins, MD

## 2020-06-03 LAB — GC/CHLAMYDIA PROBE AMP (~~LOC~~) NOT AT ARMC
Chlamydia: NEGATIVE
Comment: NEGATIVE
Comment: NORMAL
Neisseria Gonorrhea: NEGATIVE

## 2020-06-05 LAB — CULTURE, BETA STREP (GROUP B ONLY): Strep Gp B Culture: NEGATIVE

## 2020-06-10 ENCOUNTER — Ambulatory Visit (INDEPENDENT_AMBULATORY_CARE_PROVIDER_SITE_OTHER): Payer: BC Managed Care – PPO | Admitting: Family Medicine

## 2020-06-10 ENCOUNTER — Other Ambulatory Visit: Payer: Self-pay

## 2020-06-10 VITALS — BP 120/74 | HR 80 | Wt 173.0 lb

## 2020-06-10 DIAGNOSIS — O09523 Supervision of elderly multigravida, third trimester: Secondary | ICD-10-CM

## 2020-06-10 DIAGNOSIS — D573 Sickle-cell trait: Secondary | ICD-10-CM

## 2020-06-10 DIAGNOSIS — Z3A37 37 weeks gestation of pregnancy: Secondary | ICD-10-CM

## 2020-06-10 DIAGNOSIS — Z3480 Encounter for supervision of other normal pregnancy, unspecified trimester: Secondary | ICD-10-CM

## 2020-06-10 NOTE — Progress Notes (Signed)
° °  PRENATAL VISIT NOTE  Subjective:  Danielle Esparza is a 39 y.o. [id number] at [redacted]w[redacted]d being seen today for ongoing prenatal care.  She is currently monitored for the following issues for this high-risk pregnancy and has Nipple discharge; Lump or mass in breast; Cervical high risk HPV (human papillomavirus) test positive; Hemoglobin A-S genotype/sickle cell trait; Supervision of other normal pregnancy, antepartum; and AMA (advanced maternal age) multigravida 35+ on their problem list.  Patient reports no complaints.  Contractions: Not present. Vag. Bleeding: None.  Movement: Present. Denies leaking of fluid.   The following portions of the patient's history were reviewed and updated as appropriate: allergies, current medications, past family history, past medical history, past social history, past surgical history and problem list.   Objective:   Vitals:   06/10/20 1020  BP: 120/74  Pulse: 80  Weight: 173 lb (78.5 kg)    Fetal Status: Fetal Heart Rate (bpm): 150   Movement: Present     General:  Alert, oriented and cooperative. Patient is in no acute distress.  Skin: Skin is warm and dry. No rash noted.   Cardiovascular: Normal heart rate noted  Respiratory: Normal respiratory effort, no problems with respiration noted  Abdomen: Soft, gravid, appropriate for gestational age.  Pain/Pressure: Absent     Pelvic: Cervical exam deferred        Extremities: Normal range of motion.  Edema: None  Mental Status: Normal mood and affect. Normal behavior. Normal judgment and thought content.   Assessment and Plan:  Pregnancy: G4P1021 at [redacted]w[redacted]d  1. Multigravida of advanced maternal age in third trimester  2. Supervision of other normal pregnancy, antepartum Vtx on leopolds Reviewed timing of IOL at 41 wks unless labor before, could elect for IOL after 39 Reviewed process of outpatient foley  3. Hemoglobin A-S genotype/sickle cell trait   Term labor symptoms and general obstetric precautions  including but not limited to vaginal bleeding, contractions, leaking of fluid and fetal movement were reviewed in detail with the patient. Please refer to After Visit Summary for other counseling recommendations.   Return in about 1 week (around 06/17/2020) for Routine prenatal care, in person.  No future appointments.  Federico Flake, MD

## 2020-06-18 ENCOUNTER — Other Ambulatory Visit: Payer: Self-pay

## 2020-06-18 ENCOUNTER — Ambulatory Visit (INDEPENDENT_AMBULATORY_CARE_PROVIDER_SITE_OTHER): Payer: BC Managed Care – PPO | Admitting: Obstetrics and Gynecology

## 2020-06-18 VITALS — BP 115/74 | HR 80 | Wt 175.0 lb

## 2020-06-18 DIAGNOSIS — Z3480 Encounter for supervision of other normal pregnancy, unspecified trimester: Secondary | ICD-10-CM

## 2020-06-18 DIAGNOSIS — Z3A38 38 weeks gestation of pregnancy: Secondary | ICD-10-CM

## 2020-06-18 DIAGNOSIS — O09523 Supervision of elderly multigravida, third trimester: Secondary | ICD-10-CM

## 2020-06-18 NOTE — Progress Notes (Signed)
Prenatal Visit Note Date: 06/18/2020 Clinic: [facility]  Subjective:  Danielle Esparza is a 39 y.o. [phone] at [redacted]w[redacted]d being seen today for ongoing prenatal care.  She is currently monitored for the following issues for this low-risk pregnancy and has Nipple discharge; Lump or mass in breast; Cervical high risk HPV (human papillomavirus) test positive; Hemoglobin A-S genotype/sickle cell trait; Supervision of other normal pregnancy, antepartum; and AMA (advanced maternal age) multigravida 35+ on their problem list.  Patient reports no complaints.   Contractions: Irregular. Vag. Bleeding: None.  Movement: Present. Denies leaking of fluid.   The following portions of the patient's history were reviewed and updated as appropriate: allergies, current medications, past family history, past medical history, past social history, past surgical history and problem list. Problem list updated.  Objective:   Vitals:   06/18/20 1406  BP: 115/74  Pulse: 80  Weight: 175 lb (79.4 kg)    Fetal Status: Fetal Heart Rate (bpm): 142 Fundal Height: 38 cm Movement: Present  Presentation: Vertex  General:  Alert, oriented and cooperative. Patient is in no acute distress.  Skin: Skin is warm and dry. No rash noted.   Cardiovascular: Normal heart rate noted  Respiratory: Normal respiratory effort, no problems with respiration noted  Abdomen: Soft, gravid, appropriate for gestational age. Pain/Pressure: Present     Pelvic:  Cervical exam deferred        Extremities: Normal range of motion.  Edema: None  Mental Status: Normal mood and affect. Normal behavior. Normal judgment and thought content.   Urinalysis:      Assessment and Plan:  Pregnancy: G4P1021 at [redacted]w[redacted]d  1. Supervision of other normal pregnancy, antepartum Routine care. D/w pt re: PDIOL set up at 41wks nv.  Term labor symptoms and general obstetric precautions including but not limited to vaginal bleeding, contractions, leaking  of fluid and fetal movement were reviewed in detail with the patient. Please refer to After Visit Summary for other counseling recommendations.  Return in about 1 week (around 06/25/2020) for low risk, in person.   Mod[place] Bing, MD

## 2020-06-24 ENCOUNTER — Other Ambulatory Visit: Payer: Self-pay

## 2020-06-24 ENCOUNTER — Telehealth (HOSPITAL_COMMUNITY): Payer: Self-pay | Admitting: *Deleted

## 2020-06-24 ENCOUNTER — Ambulatory Visit (INDEPENDENT_AMBULATORY_CARE_PROVIDER_SITE_OTHER): Payer: BC Managed Care – PPO | Admitting: Family Medicine

## 2020-06-24 VITALS — BP 107/70 | HR 70 | Wt 177.4 lb

## 2020-06-24 DIAGNOSIS — Z3480 Encounter for supervision of other normal pregnancy, unspecified trimester: Secondary | ICD-10-CM

## 2020-06-24 DIAGNOSIS — Z3A39 39 weeks gestation of pregnancy: Secondary | ICD-10-CM

## 2020-06-24 DIAGNOSIS — O09523 Supervision of elderly multigravida, third trimester: Secondary | ICD-10-CM

## 2020-06-24 NOTE — Patient Instructions (Signed)

## 2020-06-24 NOTE — Telephone Encounter (Signed)
Preadmission screen  

## 2020-06-24 NOTE — Progress Notes (Signed)
   PRENATAL VISIT NOTE  Subjective:  Danielle Esparza is a 39 y.o. F7P1025 at [redacted]w[redacted]d being seen today for ongoing prenatal care.  She is currently monitored for the following issues for this low-risk pregnancy and has Hemoglobin A-S genotype/sickle cell trait; Supervision of other normal pregnancy, antepartum; and AMA (advanced maternal age) multigravida 35+ on their problem list.  Patient reports no complaints.  Contractions: Irregular.  .  Movement: Present. Denies leaking of fluid.   The following portions of the patient's history were reviewed and updated as appropriate: allergies, current medications, past family history, past medical history, past social history, past surgical history and problem list.   Objective:   Vitals:   06/24/20 0851  BP: 107/70  Pulse: 70  Weight: 177 lb 6.4 oz (80.5 kg)    Fetal Status: Fetal Heart Rate (bpm): 136 Fundal Height: 35 cm Movement: Present  Presentation: Vertex  General:  Alert, oriented and cooperative. Patient is in no acute distress.  Skin: Skin is warm and dry. No rash noted.   Cardiovascular: Normal heart rate noted  Respiratory: Normal respiratory effort, no problems with respiration noted  Abdomen: Soft, gravid, appropriate for gestational age.  Pain/Pressure: Present     Pelvic: Cervical exam performed in the presence of a chaperone Dilation: 1.5 Effacement (%): 40 Station: -2  Extremities: Normal range of motion.  Edema: None  Mental Status: Normal mood and affect. Normal behavior. Normal judgment and thought content.   Assessment and Plan:  Pregnancy: G4P1021 at [redacted]w[redacted]d 1. Multigravida of advanced maternal age in third trimester Normal NIPT  2. Supervision of other normal pregnancy, antepartum Feels well IOL at 39-40 wks Orders placed  Term labor symptoms and general obstetric precautions including but not limited to vaginal bleeding, contractions, leaking of fluid and fetal movement were reviewed in detail with the  patient. Please refer to After Visit Summary for other counseling recommendations.   Return in 1 week (on 07/01/2020).  Future Appointments  Date Time Provider Department Center  06/27/2020  6:30 AM [facility] SCHED ROOM [facility] None    Reva Bores, MD '

## 2020-06-25 ENCOUNTER — Other Ambulatory Visit (HOSPITAL_COMMUNITY)
Admission: RE | Admit: 2020-06-25 | Discharge: 2020-06-25 | Disposition: A | Discharge status: Home / Self Care | Payer: BC Managed Care – PPO | Source: Ambulatory Visit | Attending: Family Medicine | Admitting: Family Medicine

## 2020-06-25 ENCOUNTER — Other Ambulatory Visit: Payer: Self-pay | Admitting: Family Medicine

## 2020-06-25 DIAGNOSIS — Z20822 Contact with and (suspected) exposure to covid-19: Secondary | ICD-10-CM | POA: Insufficient documentation

## 2020-06-25 LAB — SARS CORONAVIRUS 2 (TAT 6-24 HRS): SARS Coronavirus 2: NEGATIVE

## 2020-06-27 ENCOUNTER — Inpatient Hospital Stay (HOSPITAL_COMMUNITY): Payer: BC Managed Care – PPO

## 2020-06-27 ENCOUNTER — Inpatient Hospital Stay (HOSPITAL_COMMUNITY)
Admission: AD | Admit: 2020-06-27 | Discharge: 2020-06-29 | DRG: 807 | Disposition: A | Discharge status: Home / Self Care | Payer: BC Managed Care – PPO | Attending: Obstetrics & Gynecology | Admitting: Obstetrics & Gynecology

## 2020-06-27 ENCOUNTER — Inpatient Hospital Stay (HOSPITAL_COMMUNITY): Payer: BC Managed Care – PPO | Admitting: Anesthesiology

## 2020-06-27 ENCOUNTER — Encounter (HOSPITAL_COMMUNITY): Payer: Self-pay | Admitting: Family Medicine

## 2020-06-27 DIAGNOSIS — O9902 Anemia complicating childbirth: Principal | ICD-10-CM | POA: Diagnosis present

## 2020-06-27 DIAGNOSIS — Z3A40 40 weeks gestation of pregnancy: Secondary | ICD-10-CM

## 2020-06-27 DIAGNOSIS — D573 Sickle-cell trait: Secondary | ICD-10-CM | POA: Diagnosis present

## 2020-06-27 DIAGNOSIS — Z3480 Encounter for supervision of other normal pregnancy, unspecified trimester: Secondary | ICD-10-CM

## 2020-06-27 DIAGNOSIS — Z20822 Contact with and (suspected) exposure to covid-19: Secondary | ICD-10-CM | POA: Diagnosis present

## 2020-06-27 DIAGNOSIS — O09529 Supervision of elderly multigravida, unspecified trimester: Secondary | ICD-10-CM

## 2020-06-27 DIAGNOSIS — O26893 Other specified pregnancy related conditions, third trimester: Secondary | ICD-10-CM | POA: Diagnosis present

## 2020-06-27 LAB — RPR: RPR Ser Ql: NONREACTIVE

## 2020-06-27 LAB — CBC
HCT: 33 % — ABNORMAL LOW (ref 36.0–46.0)
Hemoglobin: 11.2 g/dL — ABNORMAL LOW (ref 12.0–15.0)
MCH: 29.2 pg (ref 26.0–34.0)
MCHC: 33.9 g/dL (ref 30.0–36.0)
MCV: 86.2 fL (ref 80.0–100.0)
Platelets: 262 10*3/uL (ref 150–400)
RBC: 3.83 MIL/uL — ABNORMAL LOW (ref 3.87–5.11)
RDW: 14.3 % (ref 11.5–15.5)
WBC: 6.9 10*3/uL (ref 4.0–10.5)
nRBC: 0 % (ref 0.0–0.2)

## 2020-06-27 LAB — TYPE AND SCREEN
ABO/RH(D): O POS
Antibody Screen: NEGATIVE

## 2020-06-27 MED ORDER — LIDOCAINE HCL (PF) 1 % IJ SOLN
INTRAMUSCULAR | Status: DC | PRN
  Administered 2020-06-27: 4 mL via EPIDURAL
  Administered 2020-06-27: 3 mL via EPIDURAL
  Administered 2020-06-27: 4 mL via EPIDURAL

## 2020-06-27 MED ORDER — OXYTOCIN-SODIUM CHLORIDE 30-0.9 UT/500ML-% IV SOLN
2.5000 [IU]/h | INTRAVENOUS | Status: DC
  Filled 2020-06-27: qty 500

## 2020-06-27 MED ORDER — ZOLPIDEM TARTRATE 5 MG PO TABS: 5.0000 mg | ORAL_TABLET | Freq: Every evening | ORAL | Status: DC | PRN

## 2020-06-27 MED ORDER — FLEET ENEMA 7-19 GM/118ML RE ENEM: 1.0000 | ENEMA | RECTAL | Status: DC | PRN

## 2020-06-27 MED ORDER — EPHEDRINE 5 MG/ML INJ: 10.0000 mg | INTRAVENOUS | Status: DC | PRN

## 2020-06-27 MED ORDER — LIDOCAINE HCL (PF) 1 % IJ SOLN: 30.0000 mL | INTRAMUSCULAR | Status: DC | PRN

## 2020-06-27 MED ORDER — IBUPROFEN 600 MG PO TABS
600.0000 mg | ORAL_TABLET | Freq: Four times a day (QID) | ORAL | Status: DC
  Administered 2020-06-27 – 2020-06-29 (×7): 600 mg via ORAL
  Filled 2020-06-27 (×7): qty 1

## 2020-06-27 MED ORDER — FENTANYL-BUPIVACAINE-NACL 0.5-0.125-0.9 MG/250ML-% EP SOLN
12.0000 mL/h | EPIDURAL | Status: DC | PRN
  Filled 2020-06-27: qty 250

## 2020-06-27 MED ORDER — SIMETHICONE 80 MG PO CHEW: 80.0000 mg | CHEWABLE_TABLET | ORAL | Status: DC | PRN

## 2020-06-27 MED ORDER — ACETAMINOPHEN 325 MG PO TABS: 650.0000 mg | ORAL_TABLET | ORAL | Status: DC | PRN

## 2020-06-27 MED ORDER — COCONUT OIL OIL: 1.0000 "application " | TOPICAL_OIL | Status: DC | PRN

## 2020-06-27 MED ORDER — DOCUSATE SODIUM 100 MG PO CAPS
100.0000 mg | ORAL_CAPSULE | Freq: Two times a day (BID) | ORAL | Status: DC
  Administered 2020-06-27 – 2020-06-29 (×4): 100 mg via ORAL
  Filled 2020-06-27 (×4): qty 1

## 2020-06-27 MED ORDER — PHENYLEPHRINE 40 MCG/ML (10ML) SYRINGE FOR IV PUSH (FOR BLOOD PRESSURE SUPPORT)
80.0000 ug | PREFILLED_SYRINGE | INTRAVENOUS | Status: DC | PRN
  Administered 2020-06-27: 80 ug via INTRAVENOUS

## 2020-06-27 MED ORDER — TERBUTALINE SULFATE 1 MG/ML IJ SOLN
0.2500 mg | Freq: Once | INTRAMUSCULAR | Status: AC | PRN
  Administered 2020-06-27: 0.25 mg via SUBCUTANEOUS
  Filled 2020-06-27: qty 1

## 2020-06-27 MED ORDER — ACETAMINOPHEN 325 MG PO TABS
650.0000 mg | ORAL_TABLET | ORAL | Status: DC | PRN
  Administered 2020-06-27 – 2020-06-29 (×5): 650 mg via ORAL
  Filled 2020-06-27 (×5): qty 2

## 2020-06-27 MED ORDER — WITCH HAZEL-GLYCERIN EX PADS: 1.0000 "application " | MEDICATED_PAD | CUTANEOUS | Status: DC | PRN

## 2020-06-27 MED ORDER — OXYCODONE-ACETAMINOPHEN 5-325 MG PO TABS: 1.0000 | ORAL_TABLET | ORAL | Status: DC | PRN

## 2020-06-27 MED ORDER — ONDANSETRON HCL 4 MG/2ML IJ SOLN: 4.0000 mg | Freq: Four times a day (QID) | INTRAMUSCULAR | Status: DC | PRN

## 2020-06-27 MED ORDER — OXYCODONE-ACETAMINOPHEN 5-325 MG PO TABS: 2.0000 | ORAL_TABLET | ORAL | Status: DC | PRN

## 2020-06-27 MED ORDER — TETANUS-DIPHTH-ACELL PERTUSSIS 5-2.5-18.5 LF-MCG/0.5 IM SUSP: 0.5000 mL | Freq: Once | INTRAMUSCULAR | Status: DC

## 2020-06-27 MED ORDER — MISOPROSTOL 50MCG HALF TABLET
50.0000 ug | ORAL_TABLET | ORAL | Status: DC | PRN
  Administered 2020-06-27: 50 ug via BUCCAL
  Filled 2020-06-27: qty 1

## 2020-06-27 MED ORDER — MISOPROSTOL 25 MCG QUARTER TABLET: 25.0000 ug | ORAL_TABLET | ORAL | Status: DC | PRN

## 2020-06-27 MED ORDER — MEASLES, MUMPS & RUBELLA VAC IJ SOLR: 0.5000 mL | Freq: Once | INTRAMUSCULAR | Status: DC

## 2020-06-27 MED ORDER — LACTATED RINGERS IV SOLN: 500.0000 mL | Freq: Once | INTRAVENOUS | Status: DC

## 2020-06-27 MED ORDER — DIPHENHYDRAMINE HCL 50 MG/ML IJ SOLN: 12.5000 mg | INTRAMUSCULAR | Status: DC | PRN

## 2020-06-27 MED ORDER — SODIUM BICARBONATE 8.4 % IV SOLN
INTRAVENOUS | Status: DC | PRN
  Administered 2020-06-27: 5 mL via EPIDURAL

## 2020-06-27 MED ORDER — DIPHENHYDRAMINE HCL 25 MG PO CAPS: 25.0000 mg | ORAL_CAPSULE | Freq: Four times a day (QID) | ORAL | Status: DC | PRN

## 2020-06-27 MED ORDER — OXYTOCIN BOLUS FROM INFUSION
333.0000 mL | Freq: Once | INTRAVENOUS | Status: AC
  Administered 2020-06-27: 333 mL via INTRAVENOUS

## 2020-06-27 MED ORDER — PHENYLEPHRINE 40 MCG/ML (10ML) SYRINGE FOR IV PUSH (FOR BLOOD PRESSURE SUPPORT)
80.0000 ug | PREFILLED_SYRINGE | INTRAVENOUS | Status: DC | PRN
  Filled 2020-06-27: qty 10

## 2020-06-27 MED ORDER — LACTATED RINGERS IV SOLN: 500.0000 mL | INTRAVENOUS | Status: DC | PRN

## 2020-06-27 MED ORDER — PRENATAL MULTIVITAMIN CH
1.0000 | ORAL_TABLET | Freq: Every day | ORAL | Status: DC
  Administered 2020-06-28: 1 via ORAL
  Filled 2020-06-27: qty 1

## 2020-06-27 MED ORDER — SENNOSIDES-DOCUSATE SODIUM 8.6-50 MG PO TABS
2.0000 | ORAL_TABLET | ORAL | Status: DC
  Administered 2020-06-27 – 2020-06-28 (×2): 2 via ORAL
  Filled 2020-06-27 (×2): qty 2

## 2020-06-27 MED ORDER — SOD CITRATE-CITRIC ACID 500-334 MG/5ML PO SOLN: 30.0000 mL | ORAL | Status: DC | PRN

## 2020-06-27 MED ORDER — DIBUCAINE (PERIANAL) 1 % EX OINT: 1.0000 "application " | TOPICAL_OINTMENT | CUTANEOUS | Status: DC | PRN

## 2020-06-27 MED ORDER — ONDANSETRON HCL 4 MG PO TABS: 4.0000 mg | ORAL_TABLET | ORAL | Status: DC | PRN

## 2020-06-27 MED ORDER — LACTATED RINGERS IV SOLN: INTRAVENOUS | Status: DC

## 2020-06-27 MED ORDER — BENZOCAINE-MENTHOL 20-0.5 % EX AERO
1.0000 "application " | INHALATION_SPRAY | CUTANEOUS | Status: DC | PRN
  Administered 2020-06-27: 1 via TOPICAL
  Filled 2020-06-27: qty 56

## 2020-06-27 MED ORDER — SODIUM CHLORIDE (PF) 0.9 % IJ SOLN
INTRAMUSCULAR | Status: DC | PRN
  Administered 2020-06-27: 12 mL/h via EPIDURAL

## 2020-06-27 MED ORDER — ONDANSETRON HCL 4 MG/2ML IJ SOLN: 4.0000 mg | INTRAMUSCULAR | Status: DC | PRN

## 2020-06-27 NOTE — Anesthesia Preprocedure Evaluation (Signed)
Anesthesia Evaluation  Patient identified by MRN, date of birth, ID band Patient awake    Reviewed: Allergy & Precautions, Patient's Chart, lab work & pertinent test results  Airway Mallampati: II  TM Distance: >3 FB Neck ROM: Full    Dental no notable dental hx. (+) Teeth Intact   Pulmonary neg pulmonary ROS,    Pulmonary exam normal breath sounds clear to auscultation       Cardiovascular negative cardio ROS Normal cardiovascular exam Rhythm:Regular Rate:Normal     Neuro/Psych negative neurological ROS  negative psych ROS   GI/Hepatic Neg liver ROS, GERD  ,  Endo/Other  negative endocrine ROS  Renal/GU negative Renal ROS  negative genitourinary   Musculoskeletal negative musculoskeletal ROS (+)   Abdominal   Peds  Hematology  (+) Sickle cell trait and anemia ,   Anesthesia Other Findings   Reproductive/Obstetrics (+) Pregnancy AMA                             Anesthesia Physical Anesthesia Plan  ASA: II  Anesthesia Plan: Epidural   Post-op Pain Management:    Induction:   PONV Risk Score and Plan:   Airway Management Planned: Natural Airway  Additional Equipment:   Intra-op Plan:   Post-operative Plan:   Informed Consent: I have reviewed the patients History and Physical, chart, labs and discussed the procedure including the risks, benefits and alternatives for the proposed anesthesia with the patient or authorized representative who has indicated his/her understanding and acceptance.       Plan Discussed with: Anesthesiologist  Anesthesia Plan Comments:         Anesthesia Quick Evaluation

## 2020-06-27 NOTE — Anesthesia Procedure Notes (Signed)
Epidural Patient location during procedure: OB Start time: 06/27/2020 1:40 PM End time: 06/27/2020 1:55 PM  Staffing Anesthesiologist: Mal Amabile, MD Performed: anesthesiologist   Preanesthetic Checklist Completed: patient identified, IV checked, site marked, risks and benefits discussed, surgical consent, monitors and equipment checked, pre-op evaluation and timeout performed  Epidural Patient position: sitting Prep: DuraPrep and site prepped and draped Patient monitoring: continuous pulse ox and blood pressure Approach: midline Location: L3-L4 Injection technique: LOR air  Needle:  Needle type: Tuohy  Needle gauge: 17 G Needle length: 9 cm and 9 Needle insertion depth: 6 cm Catheter type: closed end flexible Catheter size: 19 Gauge Catheter at skin depth: 11 cm Test dose: negative and Other  Assessment Events: blood not aspirated, injection not painful, no injection resistance, no paresthesia and negative IV test  Additional Notes Patient identified. Risks and benefits discussed including failed block, incomplete  Pain control, post dural puncture headache, nerve damage, paralysis, blood pressure Changes, nausea, vomiting, reactions to medications-both toxic and allergic and post Partum back pain. All questions were answered. Patient expressed understanding and wished to proceed. Sterile technique was used throughout procedure. Epidural site was Dressed with sterile barrier dressing. No paresthesias, signs of intravascular injection Or signs of intrathecal spread were encountered. Difficult due to patient positioning. Attempt x 3. Patient was more comfortable after the epidural was dosed. Please see RN's note for documentation of vital signs and FHR which are stable. Reason for block:procedure for pain

## 2020-06-27 NOTE — Progress Notes (Signed)
Faculty [facility] OB/GYN Attending Note  Subjective:  Called to evaluate patient with repetitive variable/late FHR decelerations to 90s, had three following three consecutive contractions. Also had a few earlier.  Already has been repositioned, terbutaline was already given as she was contracting q 1-2 minutes. Currently after terbutaline administration, FHR tracing is in 150s, +mod variability with accelerations.  Patient received dose of Misoprostol around 0945.  No LOF or vaginal bleeding. Good FM.      Objective:  Blood pressure (!) 128/93, pulse 79, temperature 98.1 F (36.7 C), temperature source Oral, resp. rate 18, height 5' 4.75" (1.645 m), weight 80.3 kg, last menstrual period 09/21/2019, unknown if currently breastfeeding. FHT Currently Baseline 150 bpm, moderate variability, +accelerations, no decelerations Toco: irregular but spaced out Gen: NAD HENT: Normocephalic, atraumatic Lungs: Normal respiratory effort Heart: Regular rate noted Abdomen: NT gravid fundus, soft Cervix: Deferred Ext: 2+ DTRs, no edema, no cyanosis, negative Homan's sign  Assessment & Plan:  39 y.o. [id number] at [redacted]w[redacted]d admitted for IOL - Current Category I FHR tracing for last about 10 minutes, but will continue to monitor closely given preceeding decelerations.   - Continue IV fluids - Will do pitocin per protocol after 1345 - Hopeful for vaginal delivery   Jaynie Collins, MD, FACOG Obstetrician & Gynecologist, [facility] for [employer], [facility]

## 2020-06-27 NOTE — H&P (Addendum)
OBSTETRIC ADMISSION HISTORY AND PHYSICAL  Danielle Esparza is a 39 y.o. female [phone] with IUP at [redacted]w[redacted]d presenting for elective IOL @ term. She reports +FMs. No LOF, VB, blurry vision, headaches, peripheral edema, or RUQ pain. She plans on breastfeeding. She is unsure of birth control, considering POPs.  Dating: By LMP --->  Estimated Date of Delivery: 06/27/20  Sono:    @[redacted]w[redacted]d , normal anatomy, cephalic presentation, 64%ile, EFW 277 gm   Prenatal History/Complications: -Sickle cell trait -AMA  Past Medical History: Past Medical History:  Diagnosis Date   Allergy    seasonal   Hemoglobin A-S genotype/sickle cell trait 05/28/2015   Nipple discharge 2014    Past Surgical History: Past Surgical History:  Procedure Laterality Date   NO PAST SURGERIES      Obstetrical History: OB History     Gravida  4   Para  1   Term  1   Preterm  0   AB  2   Living  1      SAB  2   TAB  0   Ectopic  0   Multiple  0   Live Births  1        Obstetric Comments  Menstrual age: 70  Age 1st Pregnancy: N/A         Social History: Social History   Socioeconomic History   Marital status: Married    Spouse name: Not on file   Number of children: Not on file   Years of education: Not on file   Highest education level: Not on file  Occupational History   Not on file  Tobacco Use   Smoking status: Never Smoker   Smokeless tobacco: Never Used  Vaping Use   Vaping Use: Never used  Substance and Sexual Activity   Alcohol use: No   Drug use: No   Sexual activity: Yes    Partners: Male    Birth control/protection: None  Other Topics Concern   Not on file  Social History Narrative   Not on file   Social Determinants of Health   Financial Resource Strain:    Difficulty of Paying Living Expenses:   Food Insecurity:    Worried About 16 in the Last Year:    Programme researcher, broadcasting/film/video in the Last Year:   Transportation Needs:    Barista  (Medical):    Lack of Transportation (Non-Medical):   Physical Activity:    Days of Exercise per Week:    Minutes of Exercise per Session:   Stress:    Feeling of Stress :   Social Connections:    Frequency of Communication with Friends and Family:    Frequency of Social Gatherings with Friends and Family:    Attends Religious Services:    Active Member of Clubs or Organizations:    Attends Freight forwarder:    Marital Status:     Family History: Family History  Problem Relation Age of Onset   Prostate cancer Father    Sarcoidosis Father    Breast cancer Paternal Grandmother    Diabetes Paternal Grandmother    Ovarian cancer Maternal Aunt 50   Diabetes Paternal Grandfather    Diabetes Maternal Grandmother     Allergies: No Known Allergies  Medications Prior to Admission  Medication Sig Dispense Refill Last Dose   Prenatal Vit-Fe Fumarate-FA (MULTIVITAMIN-PRENATAL) 27-0.8 MG TABS tablet Take 1 tablet by mouth daily at 12 noon.  Nursing Staff Provider  Office Location  [place] Dating   LMP  Language   English Anatomy US   normal female   Flu Vaccine   Genetic Screen  Declined     TDaP vaccine   04/01/20 Hgb A1C or  GTT Early  Third trimester - WNL  Rhogam  NA   LAB RESULTS   Feeding Plan Breast Blood Type O/Positive/-- (12/23 1608)   Contraception Unsure, considering POPs Antibody Negative (12/23 1608)  Circumcision N/A Rubella 2.66 (12/23 1608)  Pediatrician  Dr. Chelsea Primus - [facility] RPR Non Reactive (04/21 0844)   Support Person Husband - Khari HBsAg Negative (12/23 1608)   Prenatal Classes Yes HIV Non Reactive (04/21 0839)    GBS  Negative    Pap  08/2019- normal     Hgb Electro   Nelson trait   BP Cuff YES CF   COVID Vaccine Got it, 2nd dose 3/31 SMA     Waterbirth  [ ]  Class [ ]  Consent [ ]  CNM visit    Review of Systems:  All systems reviewed and negative except as stated in HPI  PE: Blood pressure 109/69, pulse 94, temperature 98.4 F  (36.9 C), temperature source Oral, resp. rate 18, last menstrual period 09/21/2019, unknown if currently breastfeeding. General appearance: alert, cooperative and no distress Lungs: regular rate and effort Heart: regular rate  Abdomen: soft, non-tender Extremities: Homans sign is negative, no sign of DVT Presentation: vertex EFM: 140 bpm, moderate variability, accels present, no decels Toco: irregular ctx q 4-7 minutes   SVE: 3/50/-2, soft, midposition  Prenatal labs: ABO, Rh: O/Positive/-- (12/23 1608) Antibody: Negative (12/23 1608) Rubella: 2.66 (12/23 1608) RPR: Non Reactive (04/21 0844)  HBsAg: Negative (12/23 1608)  HIV: Non Reactive (04/21 0839)  GBS: Negative/-- (06/22 0953)  2 hr GTT - normal in 3rd trimester  Prenatal Transfer Tool  Maternal Diabetes: No Genetic Screening: Declined Maternal Ultrasounds/Referrals: Normal Fetal Ultrasounds or other Referrals:  None Maternal Substance Abuse:  No Significant Maternal Medications:  None Significant Maternal Lab Results: Group B Strep negative  Results for orders placed or performed during the hospital encounter of 06/27/20 (from the past 24 hour(s))  CBC   Collection Time: 06/27/20  8:33 AM  Result Value Ref Range   WBC 6.9 4.0 - 10.5 K/uL   RBC 3.83 (L) 3.87 - 5.11 MIL/uL   Hemoglobin 11.2 (L) 12.0 - 15.0 g/dL   HCT [birth year]1972 (L) 36 - 46 %   MCV 86.2 80.0 - 100.0 fL   MCH 29.2 26.0 - 34.0 pg   MCHC 33.9 30.0 - 36.0 g/dL   RDW 06/29/20 06/29/20 - 60.1 %   Platelets 262 150 - 400 K/uL   nRBC 0.0 0.0 - 0.2 %    Patient Active Problem List   Diagnosis Date Noted   Term pregnancy 06/27/2020   AMA (advanced maternal age) multigravida 35+ 05/14/2020   Supervision of other normal pregnancy, antepartum 12/04/2019   Hemoglobin A-S genotype/sickle cell trait 05/28/2015    Assessment: Danielle Esparza is a 39 y.o. 05/30/2015 at [redacted]w[redacted]d here for elective IOL for term. Discussed IOL options of cytotec, foley balloon, or pitocin; pt opts  for cytotec; cervix favorable Active mgmt Anticipate NSVD  1. Labor: IOL cytotec 2. FWB: Category I 3. Pain: natural pain options now; open to epidural later 4. GBS: negative   Plan: Admit to L&D  31, Student-MidWife  06/27/2020, 9:17 AM  Midwife attestation: I have seen and  examined this patient; I agree with above documentation in the midwife student's note.   Danielle Esparza is a 39 y.o. O1H0865 here for IOL, elective at [redacted]w[redacted]d.  PE: BP 104/69   Pulse 73   Temp 98.1 F (36.7 C) (Oral)   Resp 18   Ht 5' 4.75" (1.645 m)   Wt 177 lb (80.3 kg)   LMP 09/21/2019 (Exact Date)   Breastfeeding Unknown   BMI 29.68 kg/m  Gen: calm comfortable, NAD Resp: normal effort, no distress Abd: gravid  ROS, labs, PMH reviewed  Plan: Admit to LD Labor: Active management with Cytotec Fetal monitoring: Category I ID: GBS negative  Sharen Counter, CNM  06/27/2020, 4:40 PM

## 2020-06-27 NOTE — Progress Notes (Signed)
Danielle Esparza is a 39 y.o. G8T1572 at [redacted]w[redacted]d by LMP admitted for induction of labor due to elective at term and favorable.  Subjective: Pt breathing with contractions, standing in room/bathroom with contractions.  Husband in room for support.   Objective: BP 118/75   Pulse 81   Temp 98.4 F (36.9 C) (Oral)   Resp 18   LMP 09/21/2019 (Exact Date)  No intake/output data recorded. No intake/output data recorded.  FHT:  FHR: 150 bpm, variability: moderate,  accelerations:  Present,  decelerations:  Present onset of repetitive lates while pt standing UC:   regular, every 3-5 minutes SVE:   Deferred  Labs: Lab Results  Component Value Date   WBC 6.9 06/27/2020   HGB 11.2 (L) 06/27/2020   HCT 33.0 (L) 06/27/2020   MCV 86.2 06/27/2020   PLT 262 06/27/2020    Assessment / Plan: Induction of labor due to elective at term  Labor: Progressing normally Preeclampsia:  n/a Fetal Wellbeing:  Category I Pain Control:  Labor support without medications I/D:  GBS neg Anticipated MOD:  NSVD  Sharen Counter 06/27/2020, 11:09 AM

## 2020-06-28 NOTE — Progress Notes (Signed)
POSTPARTUM PROGRESS NOTE  Post Partum Day 1  Subjective:  Danielle Esparza is a 39 y.o. [id number] s/p NSVD at [redacted]w[redacted]d.  She reports she is doing well. No acute events overnight. She denies any problems with ambulating, voiding or po intake. Denies nausea or vomiting.  Pain is well controlled.  Lochia is appropriate.  Objective: Blood pressure 97/63, pulse 62, temperature (!) 97.5 F (36.4 C), temperature source Oral, resp. rate 18, height 5' 4.75" (1.645 m), weight 80.3 kg, last menstrual period 09/21/2019, SpO2 100 %, unknown if currently breastfeeding.  Physical Exam:  General: alert, cooperative and no distress Chest: no respiratory distress Heart:regular rate, distal pulses intact Abdomen: soft, nontender,  Uterine Fundus: firm, appropriately tender DVT Evaluation: No calf swelling or tenderness Extremities: no LE edema Skin: warm, dry  Recent Labs    06/27/20 0833  HGB 11.2*  HCT 33.0*    Assessment/Plan: Danielle Esparza is a 39 y.o. [id number] s/p NSVD at [redacted]w[redacted]d   PPD#1 - Doing well  Routine postpartum care Contraception: POPs Feeding: breast Dispo: Plan for discharge PPD#1-2 (OK for discharge this PM if patient desires).   LOS: 1 day   Zack Seal, MD/[facility] OB Fellow  06/28/2020, 8:04 AM

## 2020-06-28 NOTE — Anesthesia Postprocedure Evaluation (Signed)
Anesthesia Post Note  Patient: Danielle Esparza  Procedure(s) Performed: AN AD HOC LABOR EPIDURAL     Patient location during evaluation: Mother Baby Anesthesia Type: Epidural Level of consciousness: awake Pain management: satisfactory to patient Vital Signs Assessment: post-procedure vital signs reviewed and stable Respiratory status: spontaneous breathing Cardiovascular status: stable Anesthetic complications: no   No complications documented.  Last Vitals:  Vitals:   06/28/20 0213 06/28/20 0607  BP: 92/66 97/63  Pulse: 74 62  Resp: 16 18  Temp: 36.6 C (!) 36.4 C  SpO2: 100% 100%    Last Pain:  Vitals:   06/28/20 0705  TempSrc:   PainSc: 0-No pain   Pain Goal:                   Cephus Shelling

## 2020-06-28 NOTE — Lactation Note (Signed)
This note was copied from a baby's chart. Lactation Consultation Note  Patient Name: Danielle Esparza Today's Date: 06/28/2020  [name] now 7 hours old.  Mom asked how to know if she's getting enough.  Discussed adequate voids, stools, and minimal weight loss.   Infant with minimal weight loss and adequate voids and stools for first day of life. Mom denies breast and/or nipple soreness.   Urged mom to do some hand expression and spoon feeding past breastfeeds since infant DAT positive And slight increase in bili.  Urged mom to feed all she could hand express and get infant to take.  It has been almost three hours since last breastfeed.  Offered to assist with waking and feeding.  Urged mom to try not to go too long without feeding that infant needs 8 or more feeds in 24 hours.  Lab came in to do infants blood.  Urged mom to call lactation as needed.   Maternal Data    Feeding Feeding Type: Breast Fed  LATCH Score Latch: Repeated attempts needed to sustain latch, nipple held in mouth throughout feeding, stimulation needed to elicit sucking reflex.  Audible Swallowing: A few with stimulation  Type of Nipple: Everted at rest and after stimulation  Comfort (Breast/Nipple): Filling, red/small blisters or bruises, mild/mod discomfort  Hold (Positioning): No assistance needed to correctly position infant at breast.  LATCH Score: 7  Interventions Interventions: Breast feeding basics reviewed;Assisted with latch;Breast massage;Support pillows;Adjust position;Position options;Hand pump  Lactation Tools Discussed/Used     Consult Status      Neomia Dear 06/28/2020, 9:55 PM

## 2020-06-28 NOTE — Discharge Summary (Signed)
Postpartum Discharge Summary    Patient Name: Danielle Esparza DOB: 1981/08/16 MRN: [id number]  Date of admission: 06/27/2020 Delivery date:06/27/2020  Delivering provider: Aldona Lento E  Date of discharge: 06/29/2020  Admitting diagnosis: Term pregnancy [Z34.90] Intrauterine pregnancy: [redacted]w[redacted]d    Secondary diagnosis:  Principal Problem:   Postpartum care following vaginal delivery Active Problems:   AMA (advanced maternal age) multigravida 35+  Additional problems:     Discharge diagnosis: Term Pregnancy Delivered                                              Post partum procedures:None Augmentation: AROM and Cytotec Complications: None  Hospital course: Induction of Labor With Vaginal Delivery   39y.o. yo [id number] [id number] admitted to the hospital 06/27/2020 for induction of labor.  Indication for induction: Elective.  Patient had an uncomplicated labor course as follows: Membrane Rupture Time/Date: 1:39 PM ,06/27/2020   Delivery Method:Vaginal, Spontaneous  Episiotomy: None  Lacerations:  1st degree;Perineal  Details of delivery can be found in separate delivery note.  Patient had a routine postpartum course. Patient is discharged home 06/29/20.  Newborn Data: Birth date:06/27/2020  Birth time:2:46 PM  Gender:Female  Living status:Living  Apgars:9 ,9  Weight:3425 g   Magnesium Sulfate received: No BMZ received: No Rhophylac:No MMR:No T-DaP:Given prenatally Flu: No Transfusion:No  Physical exam  Vitals:   06/28/20 0213 06/28/20 0607 06/28/20 1948 06/29/20 0545  BP: 92/66 97/63 109/62 103/60  Pulse: 74 62 87 67  Resp: _0 Temp: 97.8 F (36.6 C) (!) 97.5 F (36.4 C) 98.4 F (36.9 C) 98 F (36.7 C)  TempSrc: Oral Oral Oral   SpO2: 100% 100% 100%   Weight:      Height:       General: alert, cooperative and no distress Lochia: appropriate Uterine Fundus: firm Incision: N/A DVT Evaluation: No evidence of DVT seen on physical exam. No significant  calf/ankle edema. Labs: Lab Results  Component Value Date   WBC 6.9 06/27/2020   HGB 11.2 (L) 06/27/2020   HCT 33.0 (L) 06/27/2020   MCV 86.2 06/27/2020   PLT 262 06/27/2020   CMP Latest Ref Rng & Units 11/13/2017  Glucose 65 - 99 mg/dL 75  BUN 7 - 25 mg/dL 8  Creatinine 0.50 - 1.10 mg/dL 0.92  Sodium 135 - 146 mmol/L 141  Potassium 3.5 - 5.3 mmol/L 3.9  Chloride 98 - 110 mmol/L 108  CO2 20 - 32 mmol/L 27  Calcium 8.6 - 10.2 mg/dL 9.2  Total Protein 6.1 - 8.1 g/dL 6.7  Total Bilirubin 0.2 - 1.2 mg/dL 0.4  Alkaline Phos 39 - 117 IU/L -  AST 10 - 30 U/L 9(L)  ALT 6 - 29 U/L 7   Edinburgh Score: Edinburgh Postnatal Depression Scale Screening Tool 06/27/2020  I have been able to laugh and see the funny side of things. 0  I have looked forward with enjoyment to things. 0  I have blamed myself unnecessarily when things went wrong. 1  I have been anxious or worried for no good reason. 0  I have felt scared or panicky for no good reason. 0  Things have been getting on top of me. 1  I have been so unhappy that I have had difficulty sleeping. 0  I have felt sad or miserable. 0  I  have been so unhappy that I have been crying. 0  The thought of harming myself has occurred to me. 0  Edinburgh Postnatal Depression Scale Total 2     After visit meds:  Allergies as of 06/29/2020   No Known Allergies     Medication List    TAKE these medications   acetaminophen 325 MG tablet Commonly known as: Tylenol Take 2 tablets (650 mg total) by mouth every 4 (four) hours as needed (for pain scale < 4).   fluticasone 50 MCG/ACT nasal spray Commonly known as: FLONASE Place 1-2 sprays into both nostrils daily as needed for allergies or rhinitis.   ibuprofen 600 MG tablet Commonly known as: ADVIL Take 1 tablet (600 mg total) by mouth every 6 (six) hours.   multivitamin-prenatal 27-0.8 MG Tabs tablet Take 1 tablet by mouth daily at 12 noon.   norethindrone 0.35 MG tablet Commonly known  as: Ortho Micronor Take 1 tablet (0.35 mg total) by mouth daily.   polyethylene glycol powder 17 GM/SCOOP powder Commonly known as: GLYCOLAX/MIRALAX Take 17 g by mouth daily as needed.        Discharge home in stable condition Infant Feeding: Breast Infant Disposition:home with mother Discharge instruction: per After Visit Summary and Postpartum booklet. Activity: Advance as tolerated. Pelvic rest for 6 weeks.  Diet: routine diet Future Appointments:No future appointments. Follow up Visit:   Please schedule this patient for a Virtual postpartum visit in 4 weeks with the following provider: Any provider. Additional Postpartum F/U:n/a  Low risk pregnancy complicated by: n/a Delivery mode:  Vaginal, Spontaneous  Anticipated Birth Control:  POPs   06/29/2020 Clarnce Flock, MD

## 2020-06-28 NOTE — Lactation Note (Signed)
This note was copied from a baby's chart. Lactation Consultation Note  Patient Name: Danielle Esparza Today's Date: 06/28/2020 Reason for consult: Initial assessment;Term P3, 9 hour female infant. Infant had 2 voids and 2 stools since birth. Tools given: hand pump due to mom having inverted nipples both breast.  Per mom, she has DEBP at home. Mom feels infant is latching well, BF initially  5 minutes in L&D, infant's  other 3 feedings have been 15 minutes each. Per mom, infant last BF at 2242 for 12 minutes.  LC did not observe latch at this time due to infant being asleep in basinet and mom BF less than 3 hours ago. Per mom, she did use a NS with her last child, but willing try latch this infant without it by pre-pumping breast and then latching infant at breast. Mom will continue to BF infant according to cues, on demand, 8 to 12+ times within 24 hours. LC discussed hand expression and mom taught back easily expressing 4 mls of colostrum in bullet, mom will offer EBM to infant after she latches infant at breast for the next feeding. Mom knows to call RN or LC if she needs any assistance with latching infant at breast. Mom was fitted with 24 mm breast flange on her left breast and 27 mm breast flange on her right breast. Mom shown how to use hand pump & how to disassemble, clean, & reassemble parts. Parents will continue to do STS with infant as much as possible. Mom made aware of O/P services, breastfeeding support groups, community resources, and our phone # for post-discharge questions.     Maternal Data Formula Feeding for Exclusion: No Has patient been taught Hand Expression?: Yes Does the patient have breastfeeding experience prior to this delivery?: Yes  Feeding Feeding Type: Breast Fed  LATCH Score Latch: Repeated attempts needed to sustain latch, nipple held in mouth throughout feeding, stimulation needed to elicit sucking reflex.  Audible Swallowing: None  Type of  Nipple: Everted at rest and after stimulation  Comfort (Breast/Nipple): Soft / non-tender  Hold (Positioning): Full assist, staff holds infant at breast  LATCH Score: 5  Interventions Interventions: Skin to skin;Breast massage;Hand express;Hand pump;Breast feeding basics reviewed  Lactation Tools Discussed/Used WIC Program: No Pump Review: Setup, frequency, and cleaning;Milk Storage Initiated by:: Danelle Earthly, IBCLC Date initiated:: 06/28/20   Consult Status Consult Status: Follow-up Date: 06/28/20 Follow-up type: In-patient    Danelle Earthly 06/28/2020, 12:23 AM

## 2020-06-29 MED ORDER — NORETHINDRONE 0.35 MG PO TABS
1.0000 | ORAL_TABLET | Freq: Every day | ORAL | 11 refills | Status: DC
Start: 2020-06-29 — End: 2021-06-16

## 2020-06-29 MED ORDER — ACETAMINOPHEN 325 MG PO TABS: 650.0000 mg | ORAL_TABLET | ORAL | 0 refills | Status: DC | PRN

## 2020-06-29 MED ORDER — IBUPROFEN 600 MG PO TABS: 600.0000 mg | ORAL_TABLET | Freq: Four times a day (QID) | ORAL | 0 refills | Status: DC

## 2020-06-29 MED ORDER — POLYETHYLENE GLYCOL 3350 17 GM/SCOOP PO POWD
17.0000 g | Freq: Every day | ORAL | 1 refills | Status: DC | PRN
Start: 2020-06-29 — End: 2023-09-19

## 2020-06-29 NOTE — Discharge Instructions (Signed)

## 2020-06-29 NOTE — Lactation Note (Addendum)
This note was copied from a baby's chart. Lactation Consultation Note  Patient Name: Danielle Esparza Today's Date: 06/29/2020 Reason for consult: Follow-up assessment;Infant weight loss;Term  Baby is 61 hours old , 6 % weight loss , Bili this am - 8.1 at 38 hours.  Last feeding per mom - 2:42 am for 10 mins,  LC offered to check diaper - dry, and placed baby STS on the left breast and mom initially latched the  Baby with depth and LC showed her how to ease down the chin to help the baby open wider. Baby STS/ football / left breast/ and per mom comfortable. Baby still feeding at 14 mins with multiple swallows / increased with breast compressions.  LC stressed the importance of feeding 8-12 times a day STS until the baby is back to birth weight, gaining steadily and can stay awake for majority of the feeding.  Mom denies soreness , sore nipple and engorgement prevention and tx reviewed.  Mom already has a hand pump with #24 F and # 27 F.  LC discussed it is normal when the milk comes in if breast are to full to hand express off the fullness  Or pre-pump and then latch. If the baby only feeds 1st breast , release 2nd breast down to comfort.  Mom has the [facility] pamphlet with phone numbers.    Maternal Data Has patient been taught Hand Expression?: Yes  Feeding Feeding Type: Breast Fed  LATCH Score Latch: Grasps breast easily, tongue down, lips flanged, rhythmical sucking.  Audible Swallowing: Spontaneous and intermittent  Type of Nipple: Everted at rest and after stimulation  Comfort (Breast/Nipple): Filling, red/small blisters or bruises, mild/mod discomfort  Hold (Positioning): Assistance needed to correctly position infant at breast and maintain latch.  LATCH Score: 8  Interventions Interventions: Breast feeding basics reviewed;Assisted with latch;Skin to skin;Breast massage;Breast compression;Adjust position;Support pillows;Position options;Hand pump  Lactation Tools  Discussed/Used Tools: Pump;Flanges Flange Size: 24;27 Breast pump type: Double-Electric Breast Pump Pump Review: Milk Storage   Consult Status Consult Status: Complete Date: 06/29/20    Danielle Esparza 06/29/2020, 8:54 AM

## 2020-07-27 ENCOUNTER — Ambulatory Visit (INDEPENDENT_AMBULATORY_CARE_PROVIDER_SITE_OTHER): Payer: BC Managed Care – PPO | Admitting: Family Medicine

## 2020-07-27 ENCOUNTER — Other Ambulatory Visit: Payer: Self-pay

## 2020-07-27 ENCOUNTER — Encounter: Payer: Self-pay | Admitting: Family Medicine

## 2020-07-27 VITALS — BP 127/83 | HR 69 | Wt 160.0 lb

## 2020-07-27 DIAGNOSIS — K641 Second degree hemorrhoids: Secondary | ICD-10-CM

## 2020-07-27 MED ORDER — PROCTOFOAM HC 1-1 % EX FOAM: 1.0000 | Freq: Two times a day (BID) | CUTANEOUS | 3 refills | Status: DC

## 2020-07-27 NOTE — Progress Notes (Signed)
Post Partum Visit Note  Danielle Esparza is a 39 y.o. [phone] female who presents for a postpartum visit. She is 4 weeks postpartum following a normal spontaneous vaginal delivery.  I have fully reviewed the prenatal and intrapartum course. The delivery was at 40 gestational weeks.  Anesthesia: epidural. Postpartum course has been uncomplicated. Baby is doing well. Baby is feeding by breast. Bleeding still having some spotting . Bowel function is normal. Bladder function is normal. Patient is not sexually active. Contraception method is oral progesterone-only contraceptive. Postpartum depression screening: negative. I have reviewed and confirmed this information.   The pregnancy intention screening data noted above was reviewed. Potential methods of contraception were discussed. The patient elected to proceed with Oral Contraceptive.    Edinburgh Postnatal Depression Scale - 07/27/20 1611      Edinburgh Postnatal Depression Scale:  In the Past 7 Days   I have been able to laugh and see the funny side of things. 0    I have looked forward with enjoyment to things. 0    I have blamed myself unnecessarily when things went wrong. 0    I have been anxious or worried for no good reason. 0    I have felt scared or panicky for no good reason. 0    Things have been getting on top of me. 1    I have been so unhappy that I have had difficulty sleeping. 0    I have felt sad or miserable. 0    I have been so unhappy that I have been crying. 1    The thought of harming myself has occurred to me. 0    Edinburgh Postnatal Depression Scale Total 2            The following portions of the patient's history were reviewed and updated as appropriate: allergies, current medications, past family history, past medical history, past social history, past surgical history and problem list.  Review of Systems Pertinent items noted in HPI and remainder of comprehensive ROS otherwise negative.     Objective:  Blood pressure 127/83, pulse 69, weight 160 lb (72.6 kg), unknown if currently breastfeeding.  General:  alert, cooperative and appears stated age  Lungs: normal effort  Heart:  regular rate and rhythm  Abdomen: soft, non-tender; bowel sounds normal; no masses,  no organomegaly        Assessment:    Normal  postpartum exam. Pap smear not done at today's visit.   Plan:   Essential components of care per ACOG recommendations:  1.  Mood and well being: Patient with negative depression screening today. Reviewed local resources for support.  - Patient does not use tobacco.  2. Infant care and feeding:  -Patient currently breastmilk feeding? Yes Going well. -Social determinants of health (SDOH) reviewed in EPIC. No concerns  3. Sexuality, contraception and birth spacing - Patient does not want a pregnancy in the next year.  Desired family size is 2 children.  - Reviewed forms of contraception in tiered fashion. Patient desired oral progesterone-only contraceptive today.   - Discussed birth spacing of 18 months  4. Sleep and fatigue -Encouraged family/partner/community support of 4 hrs of uninterrupted sleep to help with mood and fatigue  5. Physical Recovery  - Discussed patients delivery - Patient had a 1st degree laceration, perineal healing reviewed. Patient expressed understanding - Patient has urinary incontinence? No - Patient is safe to resume physical and sexual activity Has hemorrhoid--Proctofoam  6.  Health Maintenance - Last pap smear done 08/2019 and was normal with negative HPV.   7. No Chronic Disease   Reva Bores, MD [facility] [employer], [facility]

## 2020-11-17 ENCOUNTER — Other Ambulatory Visit: Payer: Self-pay

## 2020-11-17 ENCOUNTER — Other Ambulatory Visit (HOSPITAL_COMMUNITY)
Admission: RE | Admit: 2020-11-17 | Discharge: 2020-11-17 | Disposition: A | Discharge status: Home / Self Care | Payer: BC Managed Care – PPO | Source: Ambulatory Visit | Attending: Family Medicine | Admitting: Family Medicine

## 2020-11-17 ENCOUNTER — Ambulatory Visit (INDEPENDENT_AMBULATORY_CARE_PROVIDER_SITE_OTHER): Payer: BC Managed Care – PPO | Admitting: Family Medicine

## 2020-11-17 ENCOUNTER — Encounter: Payer: Self-pay | Admitting: Family Medicine

## 2020-11-17 VITALS — BP 140/79 | HR 69 | Ht 64.0 in | Wt 159.8 lb

## 2020-11-17 DIAGNOSIS — R87610 Atypical squamous cells of undetermined significance on cytologic smear of cervix (ASC-US): Secondary | ICD-10-CM | POA: Insufficient documentation

## 2020-11-17 DIAGNOSIS — G44229 Chronic tension-type headache, not intractable: Secondary | ICD-10-CM

## 2020-11-17 DIAGNOSIS — Z01419 Encounter for gynecological examination (general) (routine) without abnormal findings: Secondary | ICD-10-CM | POA: Diagnosis not present

## 2020-11-17 DIAGNOSIS — R030 Elevated blood-pressure reading, without diagnosis of hypertension: Secondary | ICD-10-CM | POA: Diagnosis not present

## 2020-11-17 DIAGNOSIS — N898 Other specified noninflammatory disorders of vagina: Secondary | ICD-10-CM | POA: Insufficient documentation

## 2020-11-17 DIAGNOSIS — Z1151 Encounter for screening for human papillomavirus (HPV): Secondary | ICD-10-CM | POA: Insufficient documentation

## 2020-11-17 DIAGNOSIS — R519 Headache, unspecified: Secondary | ICD-10-CM | POA: Insufficient documentation

## 2020-11-17 DIAGNOSIS — Z124 Encounter for screening for malignant neoplasm of cervix: Secondary | ICD-10-CM

## 2020-11-17 DIAGNOSIS — F419 Anxiety disorder, unspecified: Secondary | ICD-10-CM

## 2020-11-17 NOTE — Progress Notes (Signed)
Pt c/o white discharge and irritation

## 2020-11-17 NOTE — Assessment & Plan Note (Signed)
May be related to blood pressure, or anxiety, stress--stress reduction measures, monitor BP, self care.

## 2020-11-17 NOTE — Assessment & Plan Note (Signed)
DASH diet, monitor at home 3x/wk, and send reading.

## 2020-11-17 NOTE — Patient Instructions (Signed)
 Preventive Care 21-37 Years Old, Female Preventive care refers to visits with your health care provider and lifestyle choices that can promote health and wellness. This includes:  A yearly physical exam. This may also be called an annual well check.  Regular dental visits and eye exams.  Immunizations.  Screening for certain conditions.  Healthy lifestyle choices, such as eating a healthy diet, getting regular exercise, not using drugs or products that contain nicotine and tobacco, and limiting alcohol use. What can I expect for my preventive care visit? Physical exam Your health care provider will check your:  Height and weight. This may be used to calculate body mass index (BMI), which tells if you are at a healthy weight.  Heart rate and blood pressure.  Skin for abnormal spots. Counseling Your health care provider may ask you questions about your:  Alcohol, tobacco, and drug use.  Emotional well-being.  Home and relationship well-being.  Sexual activity.  Eating habits.  Work and work environment.  Method of birth control.  Menstrual cycle.  Pregnancy history. What immunizations do I need?  Influenza (flu) vaccine  This is recommended every year. Tetanus, diphtheria, and pertussis (Tdap) vaccine  You may need a Td booster every 10 years. Varicella (chickenpox) vaccine  You may need this if you have not been vaccinated. Human papillomavirus (HPV) vaccine  If recommended by your health care provider, you may need three doses over 6 months. Measles, mumps, and rubella (MMR) vaccine  You may need at least one dose of MMR. You may also need a second dose. Meningococcal conjugate (MenACWY) vaccine  One dose is recommended if you are age 19-21 years and a first-year college student living in a residence hall, or if you have one of several medical conditions. You may also need additional booster doses. Pneumococcal conjugate (PCV13) vaccine  You may need  this if you have certain conditions and were not previously vaccinated. Pneumococcal polysaccharide (PPSV23) vaccine  You may need one or two doses if you smoke cigarettes or if you have certain conditions. Hepatitis A vaccine  You may need this if you have certain conditions or if you travel or work in places where you may be exposed to hepatitis A. Hepatitis B vaccine  You may need this if you have certain conditions or if you travel or work in places where you may be exposed to hepatitis B. Haemophilus influenzae type b (Hib) vaccine  You may need this if you have certain conditions. You may receive vaccines as individual doses or as more than one vaccine together in one shot (combination vaccines). Talk with your health care provider about the risks and benefits of combination vaccines. What tests do I need?  Blood tests  Lipid and cholesterol levels. These may be checked every 5 years starting at age 20.  Hepatitis C test.  Hepatitis B test. Screening  Diabetes screening. This is done by checking your blood sugar (glucose) after you have not eaten for a while (fasting).  Sexually transmitted disease (STD) testing.  BRCA-related cancer screening. This may be done if you have a family history of breast, ovarian, tubal, or peritoneal cancers.  Pelvic exam and Pap test. This may be done every 3 years starting at age 21. Starting at age 30, this may be done every 5 years if you have a Pap test in combination with an HPV test. Talk with your health care provider about your test results, treatment options, and if necessary, the need for more   tests. Follow these instructions at home: Eating and drinking   Eat a diet that includes fresh fruits and vegetables, whole grains, lean protein, and low-fat dairy.  Take vitamin and mineral supplements as recommended by your health care provider.  Do not drink alcohol if: ? Your health care provider tells you not to drink. ? You are  pregnant, may be pregnant, or are planning to become pregnant.  If you drink alcohol: ? Limit how much you have to 0-1 drink a day. ? Be aware of how much alcohol is in your drink. In the [place] one drink equals one 12 oz bottle of beer (355 mL), one 5 oz glass of wine (148 mL), or one 1 oz glass of hard liquor (44 mL). Lifestyle  Take daily care of your teeth and gums.  Stay active. Exercise for at least 30 minutes on 5 or more days each week.  Do not use any products that contain nicotine or tobacco, such as cigarettes, e-cigarettes, and chewing tobacco. If you need help quitting, ask your health care provider.  If you are sexually active, practice safe sex. Use a condom or other form of birth control (contraception) in order to prevent pregnancy and STIs (sexually transmitted infections). If you plan to become pregnant, see your health care provider for a preconception visit. What's next?  Visit your health care provider once a year for a well check visit.  Ask your health care provider how often you should have your eyes and teeth checked.  Stay up to date on all vaccines. This information is not intended to replace advice given to you by your health care provider. Make sure you discuss any questions you have with your health care provider. Document Revised: 08/09/2018 Document Reviewed: 08/09/2018 Elsevier Patient Education  2020 Elsevier Inc.  

## 2020-11-17 NOTE — Progress Notes (Signed)
  Subjective:     Danielle Esparza is a 39 y.o. female and is here for a comprehensive physical exam. The patient reports problems - anxiety, headaches, vaginal discharge.  The following portions of the patient's history were reviewed and updated as appropriate: allergies, current medications, past family history, past medical history, past social history, past surgical history and problem list.  Review of Systems Pertinent items noted in HPI and remainder of comprehensive ROS otherwise negative.   Objective:    BP 140/79   Pulse 69   Ht 5\' 4"  (1.626 m)   Wt 159 lb 12.8 oz (72.5 kg)   Breastfeeding Yes   BMI 27.43 kg/m  General appearance: alert, cooperative and appears stated age Head: Normocephalic, without obvious abnormality, atraumatic Neck: no adenopathy, supple, symmetrical, trachea midline and thyroid not enlarged, symmetric, no tenderness/mass/nodules Lungs: clear to auscultation bilaterally Breasts: normal appearance, no masses or tenderness Heart: regular rate and rhythm Abdomen: soft, non-tender; bowel sounds normal; no masses,  no organomegaly Pelvic: cervix normal in appearance, external genitalia normal, no adnexal masses or tenderness, no cervical motion tenderness, uterus normal size, shape, and consistency and vagina normal without discharge Extremities: extremities normal, atraumatic, no cyanosis or edema Pulses: 2+ and symmetric Skin: Skin color, texture, turgor normal. No rashes or lesions Lymph nodes: Cervical, supraclavicular, and axillary nodes normal. Neurologic: Grossly normal    Assessment:    Healthy female exam.      Plan:      Problem List Items Addressed This Visit      Unprioritized   Elevated blood pressure reading    DASH diet, monitor at home 3x/wk, and send reading.      Headache    May be related to blood pressure, or anxiety, stress--stress reduction measures, monitor BP, self care.       Other Visit Diagnoses    Vaginal  discharge    -  Primary   check cultures   Relevant Orders   Cervicovaginal ancillary only   Screening for malignant neoplasm of cervix       Relevant Orders   Cytology - PAP   Encounter for gynecological examination without abnormal finding       Anxiety       self care--does not want meds--back to therapist as needed, mindfulness, yoga, meditation, exercise     Return in 1 year (on 11/17/2021).  See After Visit Summary for Counseling Recommendations

## 2020-11-18 LAB — CERVICOVAGINAL ANCILLARY ONLY
Bacterial Vaginitis (gardnerella): POSITIVE — AB
Chlamydia: NEGATIVE
Comment: NEGATIVE
Comment: NEGATIVE
Comment: NEGATIVE
Comment: NORMAL
Neisseria Gonorrhea: NEGATIVE
Trichomonas: NEGATIVE

## 2020-11-19 LAB — CYTOLOGY - PAP
Comment: NEGATIVE
Diagnosis: UNDETERMINED — AB
High risk HPV: NEGATIVE

## 2020-11-19 MED ORDER — CLINDAMYCIN PHOSPHATE 2 % VA CREA
1.0000 | TOPICAL_CREAM | Freq: Every day | VAGINAL | 0 refills | Status: DC
Start: 2020-11-19 — End: 2021-07-14

## 2020-12-19 ENCOUNTER — Other Ambulatory Visit: Payer: BC Managed Care – PPO

## 2020-12-19 DIAGNOSIS — Z20822 Contact with and (suspected) exposure to covid-19: Secondary | ICD-10-CM

## 2020-12-22 LAB — NOVEL CORONAVIRUS, NAA: SARS-CoV-2, NAA: NOT DETECTED

## 2020-12-23 ENCOUNTER — Other Ambulatory Visit: Payer: BC Managed Care – PPO

## 2020-12-23 DIAGNOSIS — Z20822 Contact with and (suspected) exposure to covid-19: Secondary | ICD-10-CM

## 2020-12-24 LAB — NOVEL CORONAVIRUS, NAA: SARS-CoV-2, NAA: NOT DETECTED

## 2020-12-24 LAB — SARS-COV-2, NAA 2 DAY TAT

## 2020-12-27 ENCOUNTER — Telehealth: Payer: Self-pay | Admitting: *Deleted

## 2020-12-27 NOTE — Telephone Encounter (Signed)
Reviewed negative Covid results with the patient. °

## 2021-03-04 ENCOUNTER — Encounter: Payer: Self-pay | Admitting: Family Medicine

## 2021-03-04 ENCOUNTER — Ambulatory Visit: Payer: BC Managed Care – PPO | Admitting: Family Medicine

## 2021-03-04 ENCOUNTER — Other Ambulatory Visit: Payer: Self-pay

## 2021-03-04 VITALS — BP 123/80 | HR 68 | Temp 98.8°F | Resp 16 | Ht 64.75 in | Wt 157.0 lb

## 2021-03-04 DIAGNOSIS — I998 Other disorder of circulatory system: Secondary | ICD-10-CM

## 2021-03-04 DIAGNOSIS — K5904 Chronic idiopathic constipation: Secondary | ICD-10-CM

## 2021-03-04 DIAGNOSIS — R6889 Other general symptoms and signs: Secondary | ICD-10-CM

## 2021-03-04 DIAGNOSIS — D509 Iron deficiency anemia, unspecified: Secondary | ICD-10-CM

## 2021-03-04 DIAGNOSIS — H538 Other visual disturbances: Secondary | ICD-10-CM | POA: Diagnosis not present

## 2021-03-04 NOTE — Patient Instructions (Signed)
Fiber Content in Foods Fiber is a substance that is found in plant foods, such as fruits, vegetables, whole grains, nuts, seeds, and beans. As part of your treatment and recovery plan, your health care provider may recommend that you eat foods that have specific amounts of dietary fiber. Some conditions may require a high-fiber diet while others may require a low-fiber diet. This sheet gives you information about the dietary fiber content of some common foods. Your health care provider will tell you how much fiber you need in your diet. If you have problems or questions, contact your health care provider or dietitian. What foods are high in fiber? Fruits  Blackberries or raspberries (fresh) --  cup (75 g) has 4 g of fiber.  Pear (fresh) -- 1 medium (180 g) has 5.5 g of fiber.  Prunes (dried) -- 6 to 8 pieces (57-76 g) has 5 g of fiber.  Apple with skin -- 1 medium (182 g) has 4.8 g of fiber.  Guava -- 1 cup (128 g) has 8.9 g of fiber. Vegetables  Peas (frozen) --  cup (80 g) has 4.4 g of fiber.  Potato with skin (baked) -- 1 medium (173 g) has 4.4 g of fiber.  Pumpkin (canned) --  cup (122 g) has 5 g of fiber.  Brussels sprouts (cooked) --  cup (78 g) has 4 g of fiber.  Sweet potato --  cup mashed (124 g) has 4 g of fiber.  Winter squash -- 1 cup cooked (205 g) has 5.7 g of fiber. Grains  Bran cereal --  cup (31 g) has 8.6 g of fiber.  Bulgur (cooked) --  cup (70 g) has 4 g of fiber.  Quinoa (cooked) -- 1 cup (185 g) has 5.2 g of fiber.  Popcorn -- 3 cups (375 g) popped has 5.8 g of fiber.  Spaghetti, whole wheat -- 1 cup (140 g) has 6 g of fiber. Meats and other proteins  Pinto beans (cooked) --  cup (90 g) has 7.7 g of fiber.  Lentils (cooked) --  cup (90 g) has 7.8 g of fiber.  Kidney beans (canned) --  cup (92.5 g) has 5.7 g of fiber.  Soybeans (canned, frozen, or fresh) --  cup (92.5 g) has 5.2 g of fiber.  Baked beans, plain or vegetarian (canned) --   cup (130 g) has 5.2 g of fiber.  Garbanzo beans or chickpeas (canned) --  cup (90 g) has 6.6 g of fiber.  Black beans (cooked) --  cup (86 g) has 7.5 g of fiber.  White beans or navy beans (cooked) --  cup (91 g) has 9.3 g of fiber. The items listed above may not be a complete list of foods with high fiber. Actual amounts of fiber may be different depending on processing. Contact a dietitian for more information.   What foods are moderate in fiber? Fruits  Banana -- 1 medium (126 g) has 3.2 g of fiber.  Melon -- 1 cup (155 g) has 1.4 g of fiber.  Orange -- 1 small (154 g) has 3.7 g of fiber.  Raisins --  cup (40 g) has 1.8 g of fiber.  Applesauce, sweetened --  cup (125 g) has 1.5 g of fiber.  Blueberries (fresh) --  cup (75 g) has 1.8 g of fiber.  Strawberries (fresh, sliced) -- 1 cup (150 g) has 3 g of fiber.  Cherries -- 1 cup (140 g) has 2.9 g of fiber. Vegetables    Broccoli (cooked) --  cup (77.5 g) has 2.1 g of fiber.  Carrots (cooked) --  cup (77.5 g) has 2.2 g of fiber.  Corn (canned or frozen) --  cup (82.5 g) has 2.1 g of fiber.  Potatoes, mashed --  cup (105 g) has 1.6 g of fiber.  Tomato -- 1 medium (62 g) has 1.5 g of fiber.  Green beans (canned) --  cup (83 g) has 2 g of fiber.  Squash, winter --  cup (58 g) has 1 g of fiber.  Sweet potato, baked -- 1 medium (150 g) has 3 g of fiber.  Cauliflower (cooked) -- 1/2 cup (90 g) has 2.3 g of fiber. Grains  Long-grain brown rice (cooked) -- 1 cup (196 g) has 3.5 g of fiber.  Bagel, plain -- one 4-inch (10 cm) bagel has 2 g of fiber.  Instant oatmeal --  cup (120 g) has about 2 g of fiber.  Macaroni noodles, enriched (cooked) -- 1 cup (140 g) has 2.5 g of fiber.  Multigrain cereal --  cup (15 g) has about 2-4 g of fiber.  Whole-wheat bread -- 1 slice (26 g) has 2 g of fiber.  Whole-wheat spaghetti noodles --  cup (70 g) has 3.2 g of fiber.  Corn tortilla -- one 6-inch (15 cm) tortilla  has 1.5 g of fiber. Meats and other proteins  Almonds --  cup or 1 oz (28 g) has 3.5 g of fiber.  Sunflower seeds in shell --  cup or  oz (11.5 g) has 1.1 g of fiber.  Vegetable or soy patty -- 1 patty (70 g) has 3.4 g of fiber.  Walnuts --  cup or 1 oz (30 g) has 2 g of fiber.  Flax seed -- 1 Tbsp (7 g) has 2.8 g of fiber. The items listed above may not be a complete list of foods that have moderate amounts of fiber. Actual amounts of fiber may be different depending on processing. Contact a dietitian for more information.   What foods are low in fiber? Low-fiber foods contain less than 1 g of fiber per serving. They include: Fruits  Fruit juice --  cup or 4 fl oz (118 mL) has 0.5 g of fiber. Vegetables  Lettuce -- 1 cup (35 g) has 0.5 g of fiber.  Cucumber (slices) --  cup (60 g) has 0.3 g of fiber.  Celery -- 1 stalk (40 g) has 0.1 g of fiber. Grains  Flour tortilla -- one 6-inch (15 cm) tortilla has 0.5 g of fiber.  White rice (cooked) --  cup (81.5 g) has 0.3 g of fiber. Meats and other proteins  Egg -- 1 large (50 g) has 0 g of fiber.  Meat, poultry, or fish -- 3 oz (85 g) has 0 g of fiber. Dairy  Milk -- 1 cup or 8 fl oz (237 mL) has 0 g of fiber.  Yogurt -- 1 cup (245 g) has 0 g of fiber. The items listed above may not be a complete list of foods that are low in fiber. Actual amounts of fiber may be different depending on processing. Contact a dietitian for more information.   Summary  Fiber is a substance that is found in plant foods, such as fruits, vegetables, whole grains, nuts, seeds, and beans.  As part of your treatment and recovery plan, your health care provider may recommend that you eat foods that have specific amounts of dietary fiber. This information is   not intended to replace advice given to you by your health care provider. Make sure you discuss any questions you have with your health care provider. Document Revised: 04/02/2020 Document  Reviewed: 04/02/2020 Elsevier Patient Education  2021 Elsevier Inc.  

## 2021-03-04 NOTE — Progress Notes (Signed)
Established patient visit   Patient: Danielle Esparza   DOB: December 26, 1980   39 y.o. Female  MRN: [id number] Visit Date: 03/04/2021  Today's healthcare provider: Shirlee Latch, MD   Chief Complaint  Patient presents with   Weight Check   Constipation   Eye Problem   Subjective    HPI  Patient presents today wanting to discuss weight loss management. She is 30mos postpartum.  Wt Readings from Last 3 Encounters:  03/04/21 157 lb (71.2 kg)  11/17/20 159 lb 12.8 oz (72.5 kg)  07/27/20 160 lb (72.6 kg)  She finds that the weight has fluctuated. Got down to 151 and then up to 163lb. In the past had challenges with thyroid that caused weight gain.  Patient also mentions she has had constipation for the last 2 months. She does have issues with this always, but reports that this has worsened in the last few months. Can go up to 3-4 days between BMs. [place] stool chart type 1. No blood in stool. Tried miralax daily.   Occasional blurry vision - happens more with overcast skies or in a gym. Has not seen opth  BP fluctuates. Feels related to working.  124/75 was the highest. Typical BPs were always 113/70. Never taken a BP med.  Social History   Tobacco Use   Smoking status: Never Smoker   Smokeless tobacco: Never Used  Vaping Use   Vaping Use: Never used  Substance Use Topics   Alcohol use: No   Drug use: No       Medications: Outpatient Medications Prior to Visit  Medication Sig   acetaminophen (TYLENOL) 325 MG tablet Take 2 tablets (650 mg total) by mouth every 4 (four) hours as needed (for pain scale < 4).   clindamycin (CLEOCIN) 2 % vaginal cream Place 1 Applicatorful vaginally at bedtime.   FENUGREEK PO Take by mouth.   fluticasone (FLONASE) 50 MCG/ACT nasal spray Place 1-2 sprays into both nostrils daily as needed for allergies or rhinitis.   hydrocortisone-pramoxine (PROCTOFOAM HC) rectal foam Place 1 applicator rectally 2 (two) times daily.    ibuprofen (ADVIL) 600 MG tablet Take 1 tablet (600 mg total) by mouth every 6 (six) hours.   norethindrone (ORTHO MICRONOR) 0.35 MG tablet Take 1 tablet (0.35 mg total) by mouth daily.   polyethylene glycol powder (GLYCOLAX/MIRALAX) 17 GM/SCOOP powder Take 17 g by mouth daily as needed.   Prenatal Vit-Fe Fumarate-FA (MULTIVITAMIN-PRENATAL) 27-0.8 MG TABS tablet Take 1 tablet by mouth daily at 12 noon.   No facility-administered medications prior to visit.    Review of Systems - per HPI     Objective    BP 123/80    Pulse 68    Temp 98.8 F (37.1 C)    Resp 16    Ht 5' 4.75" (1.645 m)    Wt 157 lb (71.2 kg)    BMI 26.33 kg/m     Physical Exam Vitals reviewed.  Constitutional:      General: She is not in acute distress.    Appearance: Normal appearance. She is well-developed. She is not diaphoretic.  HENT:     Head: Normocephalic and atraumatic.  Eyes:     General: No scleral icterus.    Conjunctiva/sclera: Conjunctivae normal.  Neck:     Thyroid: No thyromegaly.  Cardiovascular:     Rate and Rhythm: Normal rate and regular rhythm.     Pulses: Normal pulses.     Heart sounds:  Normal heart sounds. No murmur heard.   Pulmonary:     Effort: Pulmonary effort is normal. No respiratory distress.     Breath sounds: Normal breath sounds. No wheezing, rhonchi or rales.  Abdominal:     General: There is no distension.     Palpations: Abdomen is soft.     Tenderness: There is no abdominal tenderness.  Musculoskeletal:     Cervical back: Neck supple.     Right lower leg: No edema.     Left lower leg: No edema.  Lymphadenopathy:     Cervical: No cervical adenopathy.  Skin:    General: Skin is warm and dry.     Findings: No rash.  Neurological:     Mental Status: She is alert and oriented to person, place, and time. Mental status is at baseline.  Psychiatric:        Mood and Affect: Mood normal.        Behavior: Behavior normal.     Results for orders placed or  performed in visit on 03/04/21  CBC  Result Value Ref Range   WBC 4.8 3.4 - 10.8 x10E3/uL   RBC 4.04 3.77 - 5.28 x10E6/uL   Hemoglobin 12.0 11.1 - 15.9 g/dL   Hematocrit 43.3 29.5 - 46.6 %   MCV 87 79 - 97 fL   MCH 29.7 26.6 - 33.0 pg   MCHC 34.3 31.5 - 35.7 g/dL   RDW 18.8 41.6 - 60.6 %   Platelets 280 150 - 450 x10E3/uL  TSH  Result Value Ref Range   TSH 0.747 0.450 - 4.500 uIU/mL  Fe+TIBC+Fer  Result Value Ref Range   Total Iron Binding Capacity 378 250 - 450 ug/dL   UIBC 301 601 - 093 ug/dL   Iron 55 27 - 235 ug/dL   Iron Saturation 15 15 - 55 %   Ferritin 37 15 - 150 ng/mL    Assessment & Plan     1. Fluctuation of weight - discussed that some weight fluctuation after pregnancy and while breastfeeding is normal - will check TSH as patient states she has had issue with this before to ensure this is not contributing - Discussed diet and exercise  - TSH  2. Blurry vision - will refer to Optho for eval and management - Ambulatory referral to Ophthalmology  3. Chronic idiopathic constipation - discussed importance of high fiber diet - handout given - discussed using miralax daily and titrating to one soft BM daily - may consider amitiza in the future - TSH  4. Iron deficiency anemia, unspecified iron deficiency anemia type - trying to increase daily iron intake in her diet  - recheck CBC and iron panel - CBC - Fe+TIBC+Fer  5. Fluctuating blood pressure - normal today and even the readings that she is concerned about are normal at home - reassurance given   Return if symptoms worsen or fail to improve.      I, Shirlee Latch, MD, have reviewed all documentation for this visit. The documentation on 03/05/21 for the exam, diagnosis, procedures, and orders are all accurate and complete.   Jaman Aro, Marzella Schlein, MD, MPH [facility]

## 2021-03-05 ENCOUNTER — Encounter: Payer: Self-pay | Admitting: Family Medicine

## 2021-03-05 LAB — TSH: TSH: 0.747 u[IU]/mL (ref 0.450–4.500)

## 2021-03-05 LAB — CBC
Hematocrit: 35 % (ref 34.0–46.6)
Hemoglobin: 12 g/dL (ref 11.1–15.9)
MCH: 29.7 pg (ref 26.6–33.0)
MCHC: 34.3 g/dL (ref 31.5–35.7)
MCV: 87 fL (ref 79–97)
Platelets: 280 x10E3/uL (ref 150–450)
RBC: 4.04 x10E6/uL (ref 3.77–5.28)
RDW: 13.3 % (ref 11.7–15.4)
WBC: 4.8 x10E3/uL (ref 3.4–10.8)

## 2021-03-05 LAB — IRON,TIBC AND FERRITIN PANEL
Ferritin: 37 ng/mL (ref 15–150)
Iron Saturation: 15 % (ref 15–55)
Iron: 55 ug/dL (ref 27–159)
Total Iron Binding Capacity: 378 ug/dL (ref 250–450)
UIBC: 323 ug/dL (ref 131–425)

## 2021-03-11 ENCOUNTER — Encounter: Payer: Self-pay | Admitting: Family Medicine

## 2021-03-11 NOTE — Telephone Encounter (Signed)
Will forward to PCP 

## 2021-03-24 ENCOUNTER — Encounter: Payer: BC Managed Care – PPO | Admitting: Family Medicine

## 2021-05-11 IMAGING — US US MFM OB DETAIL+14 WK
1 series · 13 of 28 positions shown · non-contrast
Comparison: none

[Series 1: us mfm ob detail+14 wk · 13 of 149 slices shown]
[im 6/149]
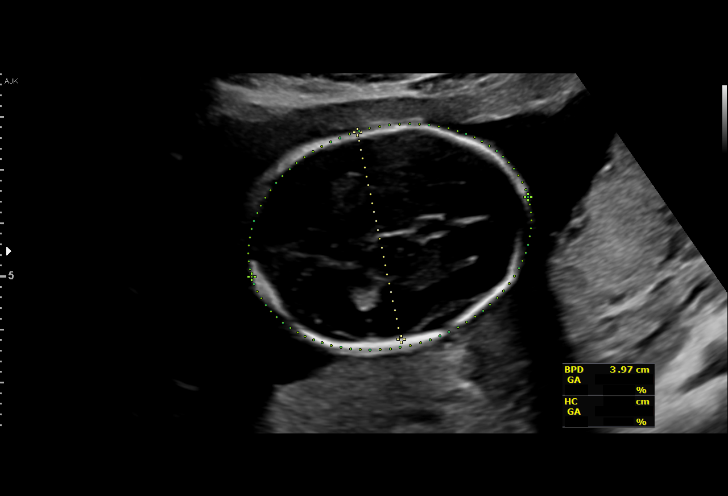
[im 17/149]
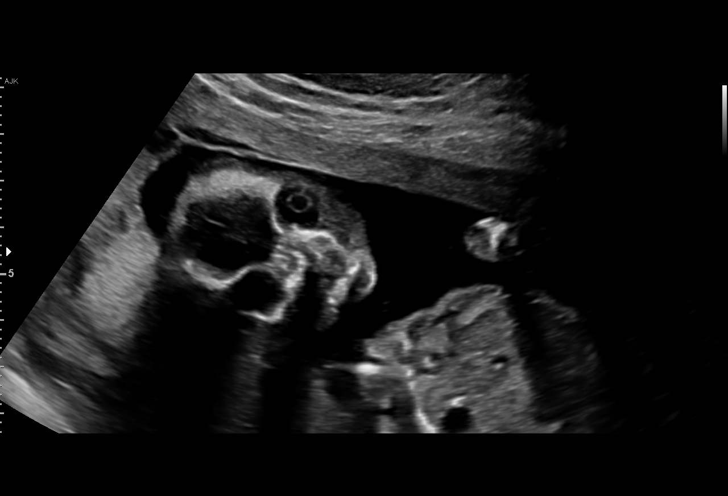
[im 28/149]
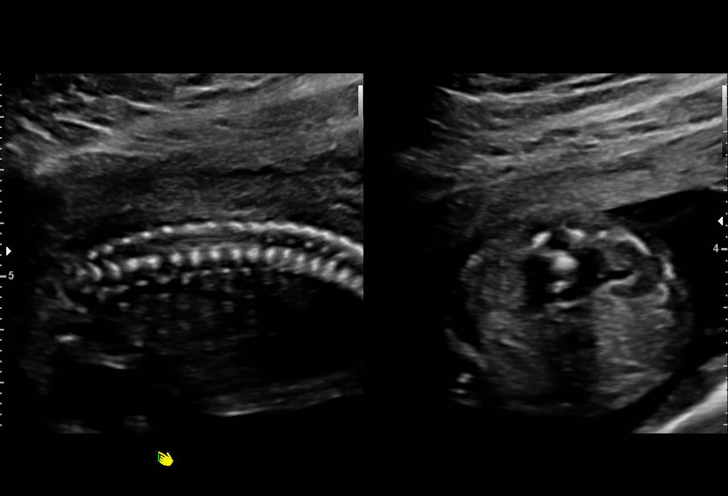
[im 39/149]
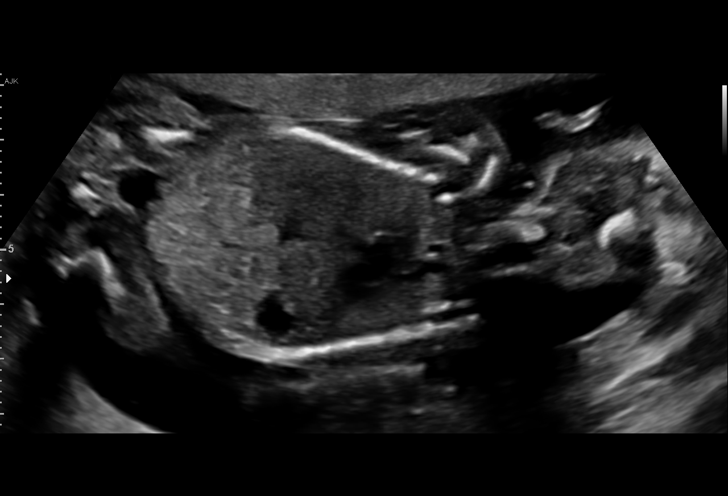
[im 50/149]
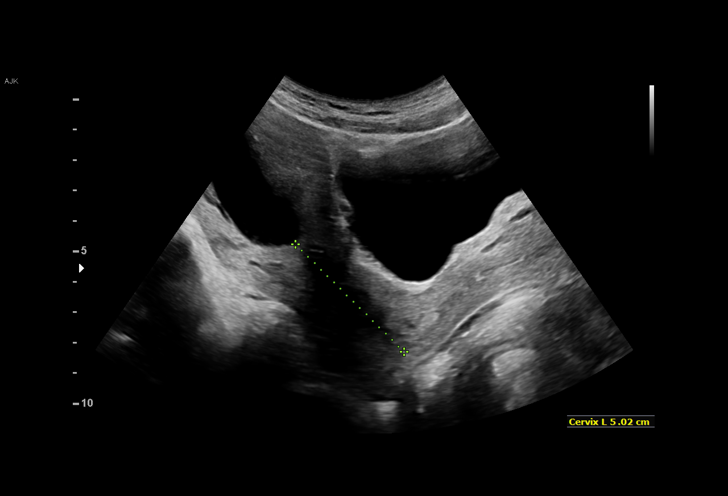
[im 61/149]
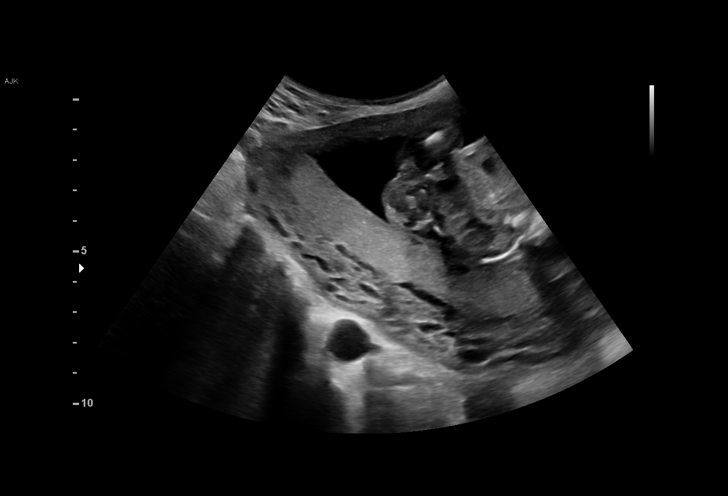
[im 77/149]
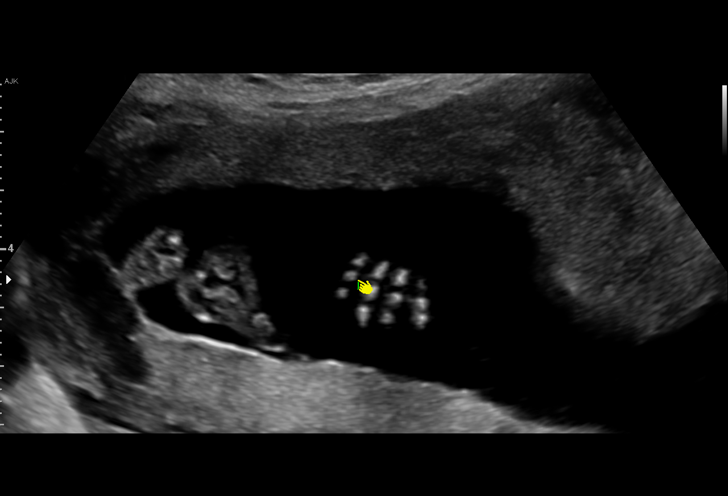
[im 88/149]
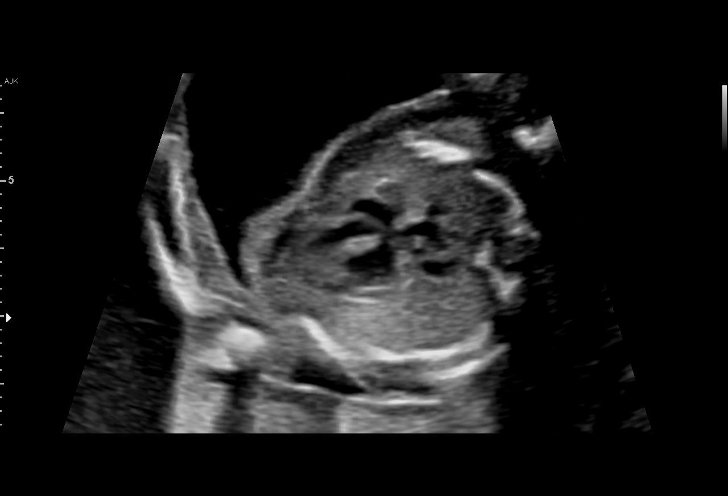
[im 99/149]
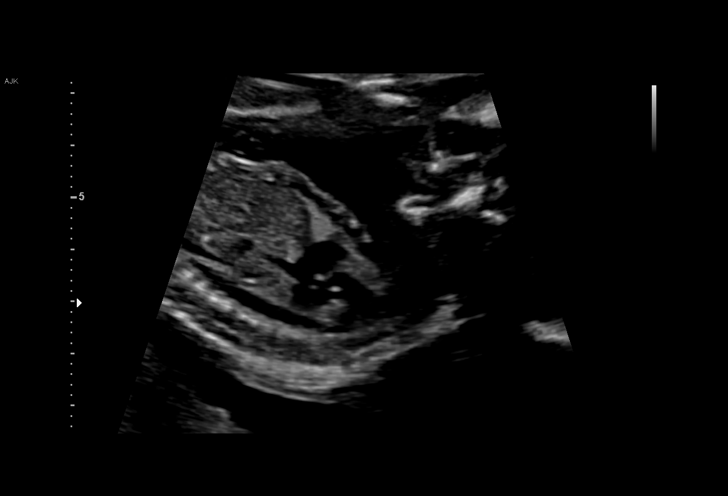
[im 110/149]
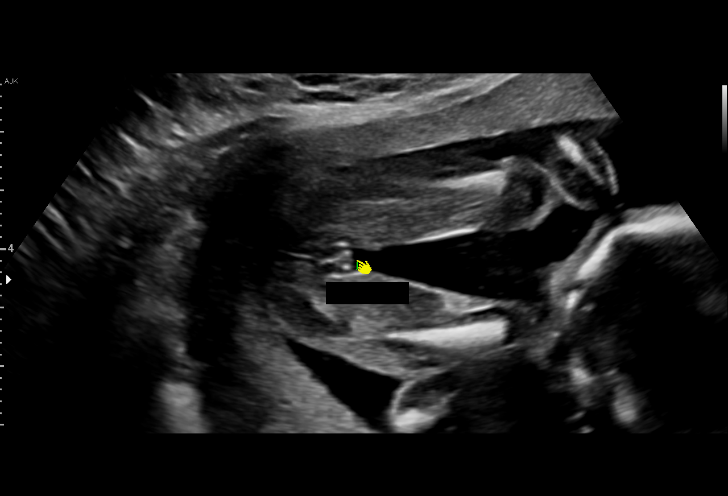
[im 121/149]
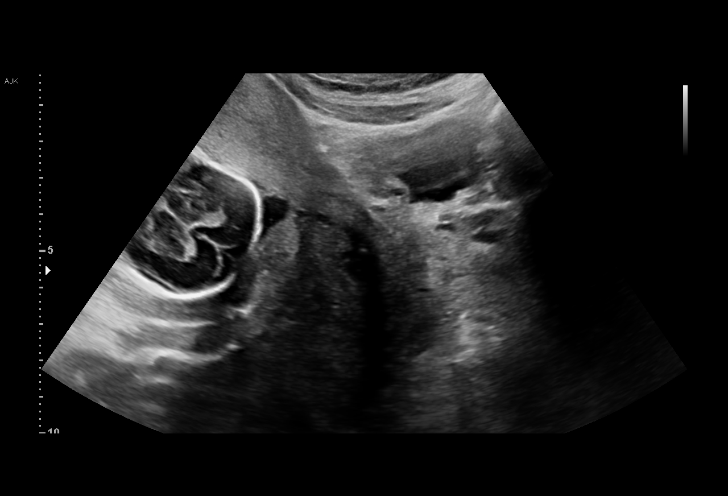
[im 132/149]
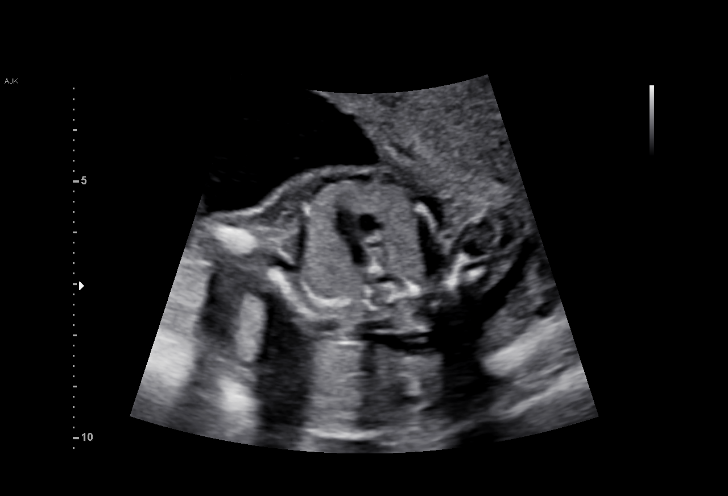
[im 143/149]
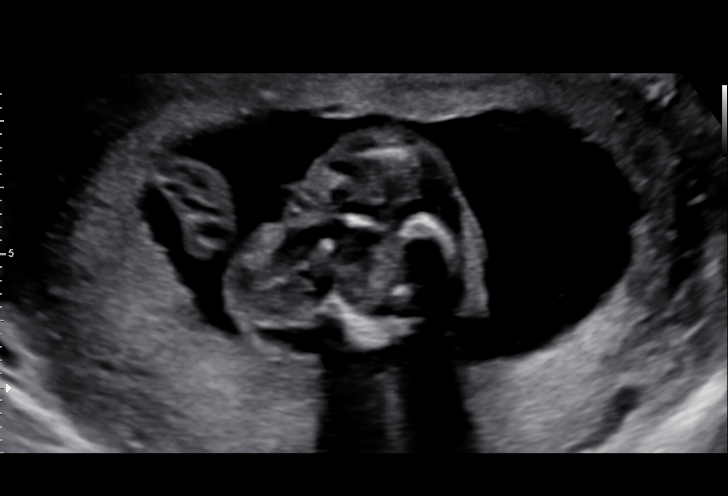

[13 of 28 positions shown; findings below may reference images not displayed]

NAIROBI CNM

 ----------------------------------------------------------------------

 ----------------------------------------------------------------------
Indications

  Advanced maternal age multigravida 35+,
  second trimester (38)
  18 weeks gestation of pregnancy
  Encounter for antenatal screening for
  malformations
 ----------------------------------------------------------------------
Fetal Evaluation

 Num Of Fetuses:         1
 Fetal Heart Rate(bpm):  170
 Cardiac Activity:       Observed
 Presentation:           Cephalic
 Placenta:               Posterior
 P. Cord Insertion:      Visualized, central

 Amniotic Fluid
 AFI FV:      Within normal limits

                             Largest Pocket(cm)

Biometry

 BPD:      40.2  mm     G. Age:  18w 1d         23  %    CI:        71.57   %    70 - 86
                                                         FL/HC:      18.5   %    16.1 -
 HC:      151.3  mm     G. Age:  18w 1d         14  %    HC/AC:      1.03        1.09 -
 AC:      146.6  mm     G. Age:  20w 0d         80  %    FL/BPD:     69.7   %
 FL:         28  mm     G. Age:  18w 4d         33  %    FL/AC:      19.1   %    20 - 24
 HUM:      27.9  mm     G. Age:  19w 0d         54  %
 CER:      17.5  mm     G. Age:  17w 4d         15  %
 NFT:       4.8  mm
 LV:        5.8  mm
 CM:        4.6  mm
 Est. FW:     277  gm    0 lb 10 oz      64  %
OB History

 Gravidity:    4         Term:   1         SAB:   2
Gestational Age

 LMP:           18w 6d        Date:  09/21/19                 EDD:   06/27/20
 U/S Today:     18w 5d                                        EDD:   06/28/20
 Best:          18w 6d     Det. By:  LMP  (09/21/19)          EDD:   06/27/20
Anatomy

 Cranium:               Appears normal         LVOT:                   Appears normal
 Cavum:                 Appears normal         Aortic Arch:            Appears normal
 Ventricles:            Appears normal         Diaphragm:              Appears normal
 Choroid Plexus:        Appears normal         Stomach:                Appears normal, left
                                                                       sided
 Cerebellum:            Appears normal         Abdomen:                Appears normal
 Posterior Fossa:       Appears normal         Abdominal Wall:         Appears nml (cord
                                                                       insert, abd wall)
 Nuchal Fold:           Appears normal         Cord Vessels:           Appears normal (3
                                                                       vessel cord)
 Face:                  Appears normal         Kidneys:                Appear normal
                        (orbits and profile)
 Lips:                  Appears normal         Bladder:                Appears normal
 Palate:                Appears normal         Spine:                  Appears normal
 Thoracic:              Appears normal         Upper Extremities:      Appears normal
 Heart:                 Appears normal         Lower Extremities:      Appears normal
                        (4CH, axis, and
                        situs)
 RVOT:                  Appears normal

 Other:  Fetus appears to be female. Nasal bone visualized. Heels and 5th
         digit visualized. Open hands visualized.
Cervix Uterus Adnexa

 Cervix
 Length:           4.75  cm.
 Normal appearance by transabdominal scan.

 Uterus
 No abnormality visualized.

 Left Ovary
 Within normal limits.

 Right Ovary
 Within normal limits.

 Adnexa
 No abnormality visualized.
Comments

 This patient was seen for a detailed fetal anatomy scan due
 to advanced maternal age. She denies any significant past
 medical history and denies any problems in her current
 pregnancy.
 The patient did not have any screening test for fetal
 aneuploidy drawn in her current pregnancy.
 She was informed that the fetal growth and amniotic fluid
 level were appropriate for her gestational age.
 There were no obvious fetal anomalies noted on today's
 ultrasound exam.
 The patient was informed that anomalies may be missed due
 to technical limitations. If the fetus is in a suboptimal position
 or maternal habitus is increased, visualization of the fetus in
 the maternal uterus may be impaired.
 The increased risk of fetal aneuploidy due to advanced
 maternal age was discussed. Due to advanced maternal age,
 the patient was offered and declined an amniocentesis today
 for definitive diagnosis of fetal aneuploidy.  She was also
 advised that she may still have a screening test drawn today
 for fetal aneuploidy.  She declined this option.
 Follow-up as indicated.

## 2021-05-12 ENCOUNTER — Emergency Department
Admission: EM | Admit: 2021-05-12 | Discharge: 2021-05-12 | Disposition: A | Discharge status: Home / Self Care | Payer: BC Managed Care – PPO | Attending: Emergency Medicine | Admitting: Emergency Medicine

## 2021-05-12 ENCOUNTER — Encounter: Payer: Self-pay | Admitting: Emergency Medicine

## 2021-05-12 ENCOUNTER — Other Ambulatory Visit: Payer: Self-pay

## 2021-05-12 ENCOUNTER — Ambulatory Visit: Payer: Self-pay | Admitting: *Deleted

## 2021-05-12 DIAGNOSIS — G629 Polyneuropathy, unspecified: Secondary | ICD-10-CM | POA: Diagnosis not present

## 2021-05-12 DIAGNOSIS — R202 Paresthesia of skin: Secondary | ICD-10-CM | POA: Diagnosis present

## 2021-05-12 LAB — POC URINE PREG, ED: Preg Test, Ur: NEGATIVE

## 2021-05-12 NOTE — Discharge Instructions (Signed)
Read and follow discharge care instruction.,  Neurology doctor listed in your discharge care instructions and tell them you are a follow-up from the emergency room.  They will schedule you for an appointment for definitive evaluation and treatment.

## 2021-05-12 NOTE — Telephone Encounter (Signed)
  Pt called in c/o numbness in her right thigh and intermittent numbness below her right thigh since 7:00 this morning.   Her right hand is also numb feeling.   She can move and use her hand and is walking fine without balance issues when questioned.   Her speech is clear and appropriate.  No history of blood clots.   This is the first time this has ever happened.  I have instructed her to go to the ED.   She said she lives very close to [facility] so she will drive herself there instead of calling 911.  I sent my notes to [facility] for Dr. Esmond Camper information.   Reason for Disposition . [1] Numbness (i.e., loss of sensation) of the face, arm / hand, or leg / foot on one side of the body AND [2] sudden onset AND [3] present now    Pt said she lives very close to [facility] so will drive herself there.  Answer Assessment - Initial Assessment Questions 1. SYMPTOM: "What is the main symptom you are concerned about?" (e.g., weakness, numbness)     I'm having numbness in right hand and right leg.     2. ONSET: "When did this start?" (minutes, hours, days; while sleeping)     I was putting on lotion after getting out of the shower and noticed my right leg is numb.   I can walk.   My leg is numb.    My right hand is numb too.  It feels like carpel tunnel.   I don't have carpel tunnel. N 3. LAST NORMAL: "When was the last time you (the patient) were normal (no symptoms)?"     7:00 AM this morning 4. PATTERN "Does this come and go, or has it been constant since it started?"  "Is it present now?"     The numbness in thigh is constant but below that it come and goes.    My BP is 126/79.  Pulse 72 this morning.   5. CARDIAC SYMPTOMS: "Have you had any of the following symptoms: chest pain, difficulty breathing, palpitations?"     No shortness of breath or chest pain. 6. NEUROLOGIC SYMPTOMS: "Have you had any of the following symptoms: headache, dizziness, vision loss,  double vision, changes in speech, unsteady on your feet?"     None of the above 7. OTHER SYMPTOMS: "Do you have any other symptoms?"     No 8. PREGNANCY: "Is there any chance you are pregnant?" "When was your last menstrual period?"     I just had my period finished Sat.  Protocols used: NEUROLOGIC DEFICIT-A-AH

## 2021-05-12 NOTE — ED Provider Notes (Signed)
[facility] Emergency Department Provider Note   ____________________________________________   Event Date/Time   First MD Initiated Contact with Patient 05/12/21 [phone]     (approximate)  I have reviewed the triage vital signs and the nursing notes.   HISTORY  Chief Complaint No chief complaint on file.    HPI Danielle Esparza is a 40 y.o. female patient presents with tingling in her right hand and bicep, patient also has bilateral numbness in the lower extremities.  Onset of complaint was after taking a shower this morning.  Patient denies, pain, weakness, vertigo, or vision disturbance.  Patient is requesting consult to neurology.         Past Medical History:  Diagnosis Date  . Allergy    seasonal  . Hemoglobin A-S genotype/sickle cell trait 05/28/2015  . Nipple discharge 2014    Patient Active Problem List   Diagnosis Date Noted  . Elevated blood pressure reading 11/17/2020  . Headache 11/17/2020  . Hemoglobin A-S genotype/sickle cell trait 05/28/2015    Past Surgical History:  Procedure Laterality Date  . NO PAST SURGERIES      Prior to Admission medications   Medication Sig Start Date End Date Taking? Authorizing Provider  acetaminophen (TYLENOL) 325 MG tablet Take 2 tablets (650 mg total) by mouth every 4 (four) hours as needed (for pain scale < 4). 06/29/20   Venora Maples, MD  clindamycin (CLEOCIN) 2 % vaginal cream Place 1 Applicatorful vaginally at bedtime. 11/19/20   Reva Bores, MD  FENUGREEK PO Take by mouth.    [provider]  fluticasone (FLONASE) 50 MCG/ACT nasal spray Place 1-2 sprays into both nostrils daily as needed for allergies or rhinitis.    [provider]  hydrocortisone-pramoxine (PROCTOFOAM HC) rectal foam Place 1 applicator rectally 2 (two) times daily. 07/27/20   Reva Bores, MD  ibuprofen (ADVIL) 600 MG tablet Take 1 tablet (600 mg total) by mouth every 6 (six) hours. 06/29/20    Venora Maples, MD  norethindrone (ORTHO MICRONOR) 0.35 MG tablet Take 1 tablet (0.35 mg total) by mouth daily. 06/29/20 06/29/21  Venora Maples, MD  polyethylene glycol powder (GLYCOLAX/MIRALAX) 17 GM/SCOOP powder Take 17 g by mouth daily as needed. 06/29/20   Venora Maples, MD  Prenatal Vit-Fe Fumarate-FA (MULTIVITAMIN-PRENATAL) 27-0.8 MG TABS tablet Take 1 tablet by mouth daily at 12 noon.    [provider]    Allergies Patient has no known allergies.  Family History  Problem Relation Age of Onset  . Prostate cancer Father   . Sarcoidosis Father   . Breast cancer Paternal Grandmother   . Diabetes Paternal Grandmother   . Ovarian cancer Maternal Aunt 50  . Diabetes Paternal Grandfather   . Diabetes Maternal Grandmother     Social History Social History   Tobacco Use  . Smoking status: Never Smoker  . Smokeless tobacco: Never Used  Vaping Use  . Vaping Use: Never used  Substance Use Topics  . Alcohol use: No  . Drug use: No    Review of Systems  Constitutional: No fever/chills Eyes: No visual changes. ENT: No sore throat. Cardiovascular: Denies chest pain. Respiratory: Denies shortness of breath. Gastrointestinal: No abdominal pain.  No nausea, no vomiting.  No diarrhea.  No constipation. Genitourinary: Negative for dysuria. Musculoskeletal: Negative for back pain. Skin: Negative for rash. Neurological: Negative for headaches, focal  Numbness right upper extremity and bilateral lower extremity Hematological/Lymphatic:  Sickle cell  trait.   ____________________________________________   PHYSICAL EXAM:  VITAL SIGNS: ED Triage Vitals  Enc Vitals Group     BP 05/12/21 0926 (!) 113/93     Pulse Rate 05/12/21 0926 (!) 56     Resp 05/12/21 0926 17     Temp 05/12/21 0926 98 F (36.7 C)     Temp Source 05/12/21 0926 Oral     SpO2 05/12/21 0926 100 %     Weight 05/12/21 0927 154 lb 12.8 oz (70.2 kg)     Height 05/12/21 0927 5' 4.75" (1.645  m)     Head Circumference --      Peak Flow --      Pain Score 05/12/21 0926 0     Pain Loc --      Pain Edu? --      Excl. in GC? --     Constitutional: Alert and oriented. Well appearing and in no acute distress. Eyes: Conjunctivae are normal. PERRL. EOMI. Head: Atraumatic. Nose: No congestion/rhinnorhea. Mouth/Throat: Mucous membranes are moist.  Oropharynx non-erythematous. Neck: No stridor.  No cervical spine tenderness to palpation. Hematological/Lymphatic/Immunilogical: No cervical lymphadenopathy. Cardiovascular: Asymptomatic bradycardia, regular rhythm. Grossly normal heart sounds.  Good peripheral circulation via palpation. Respiratory: Normal respiratory effort.  No retractions. Lungs CTAB. Genitourinary: Deferred Musculoskeletal: No lower extremity tenderness nor edema.  No joint effusions. Neurologic:  Normal speech and language. No gross focal neurologic deficits are appreciated. No gait instability. Skin:  Skin is warm, dry and intact. No rash noted. Psychiatric: Mood and affect are normal. Speech and behavior are normal.  ____________________________________________   LABS (all labs ordered are listed, but only abnormal results are displayed)  Labs Reviewed  POC URINE PREG, ED   ____________________________________________  EKG  Read by heart station Dr. Mahala Menghini no acute findings. ____________________________________________  RADIOLOGY Margarite Gouge, personally viewed and evaluated these images (plain radiographs) as part of my medical decision making, as well as reviewing the written report by the radiologist.  ED MD interpretation:   Official radiology report(s): No results found.  ____________________________________________   PROCEDURES  Procedure(s) performed (including Critical Care):  Procedures   ____________________________________________   INITIAL IMPRESSION / ASSESSMENT AND PLAN / ED COURSE  As part of my medical decision  making, I reviewed the following data within the electronic MEDICAL RECORD NUMBER         Patient presents with numbness to the right upper extremity and bilateral lower extremities upon a.m. awakening.  Patient denies weakness, vision disturbance, or vertigo.  Patient complaining and physical exam is consistent with peripheral neuropathy.  Further evaluation by neurologist is warranted.  Consult generated and patient will call to schedule an appointment.  Return to ED if condition worsens.     ____________________________________________   FINAL CLINICAL IMPRESSION(S) / ED DIAGNOSES  Final diagnoses:  Peripheral polyneuropathy     ED Discharge Orders    None       Note:  This document was prepared using Dragon voice recognition software and may include unintentional dictation errors.    Joni Reining, PA-C 05/12/21 1019    Minna Antis, MD 05/12/21 1438

## 2021-05-12 NOTE — Telephone Encounter (Signed)
Fyi. Thanks.

## 2021-05-12 NOTE — ED Triage Notes (Signed)
Pt arrives to ED with complaint of bilateral numbness in left leg/thigh, right hand, and experiencing tingling in right hand and bicep. Symptom onset was after shower this morning. Pt ambulatory, with unlabored respirations.

## 2021-05-12 NOTE — ED Notes (Signed)
See triage note  Presents with some numbness to right thigh and hand  States she noticed this feeling after getting out of the shower  No injury  Grips equal  Ambulates well

## 2021-06-15 ENCOUNTER — Other Ambulatory Visit: Payer: Self-pay | Admitting: Family Medicine

## 2021-07-14 ENCOUNTER — Encounter: Payer: Self-pay | Admitting: Family Medicine

## 2021-07-14 ENCOUNTER — Other Ambulatory Visit: Payer: Self-pay

## 2021-07-14 ENCOUNTER — Ambulatory Visit (INDEPENDENT_AMBULATORY_CARE_PROVIDER_SITE_OTHER): Payer: BC Managed Care – PPO | Admitting: Family Medicine

## 2021-07-14 VITALS — BP 103/65 | HR 75 | Wt 155.0 lb

## 2021-07-14 DIAGNOSIS — Z01818 Encounter for other preprocedural examination: Secondary | ICD-10-CM | POA: Diagnosis not present

## 2021-07-14 DIAGNOSIS — R102 Pelvic and perineal pain: Secondary | ICD-10-CM | POA: Diagnosis not present

## 2021-07-14 LAB — POCT URINE PREGNANCY: Preg Test, Ur: NEGATIVE

## 2021-07-14 MED ORDER — AZITHROMYCIN 250 MG PO TABS: 250.0000 mg | ORAL_TABLET | Freq: Every day | ORAL | 0 refills | Status: DC

## 2021-07-14 NOTE — Assessment & Plan Note (Signed)
Trial of Abx, still breast feeding so azithro Check labs and u/s Considering IUD, Copper, if all is negative can place next visit.

## 2021-07-14 NOTE — Progress Notes (Signed)
Neg UPT

## 2021-07-14 NOTE — Progress Notes (Signed)
   Subjective:    Patient ID: Danielle Esparza is a 40 y.o. female presenting with No chief complaint on file.  on 07/14/2021  HPI: Here for abnormal bleeding. On Micronor and had 3 cycles in July. Also, having pain in her lower abdomen.  Review of Systems  Constitutional:  Negative for chills and fever.  Respiratory:  Negative for shortness of breath.   Cardiovascular:  Negative for chest pain.  Gastrointestinal:  Negative for abdominal pain, nausea and vomiting.  Genitourinary:  Positive for pelvic pain and vaginal bleeding. Negative for dysuria.  Skin:  Negative for rash.     Objective:    BP 103/65   Pulse 75   Wt 155 lb (70.3 kg)   Breastfeeding Yes   BMI 25.99 kg/m  Physical Exam Constitutional:      General: She is not in acute distress.    Appearance: She is well-developed.  HENT:     Head: Normocephalic and atraumatic.  Eyes:     General: No scleral icterus. Cardiovascular:     Rate and Rhythm: Normal rate.  Pulmonary:     Effort: Pulmonary effort is normal.  Abdominal:     Palpations: Abdomen is soft.  Musculoskeletal:     Cervical back: Neck supple.  Skin:    General: Skin is warm and dry.  Neurological:     Mental Status: She is alert and oriented to person, place, and time.        Assessment & Plan:   Problem List Items Addressed This Visit       Unprioritized   Pelvic pain    Trial of Abx, still breast feeding so azithro Check labs and u/s Considering IUD, Copper, if all is negative can place next visit.       Relevant Medications   azithromycin (ZITHROMAX) 250 MG tablet   Other Relevant Orders   US PELVIC COMPLETE WITH TRANSVAGINAL   TSH   CBC   RESOLVED: Preprocedural examination - Primary   Relevant Orders   POCT urine pregnancy    Return in about 4 weeks (around 08/11/2021) for a follow-up and possible IUD insertion with Paragard.  Reva Bores 07/14/2021 3:40 PM

## 2021-07-15 LAB — CBC
Hematocrit: 33.8 % — ABNORMAL LOW (ref 34.0–46.6)
Hemoglobin: 11.4 g/dL (ref 11.1–15.9)
MCH: 29.8 pg (ref 26.6–33.0)
MCHC: 33.7 g/dL (ref 31.5–35.7)
MCV: 89 fL (ref 79–97)
Platelets: 242 10*3/uL (ref 150–450)
RBC: 3.82 x10E6/uL (ref 3.77–5.28)
RDW: 13.2 % (ref 11.7–15.4)
WBC: 5.2 10*3/uL (ref 3.4–10.8)

## 2021-07-15 LAB — TSH: TSH: 0.798 u[IU]/mL (ref 0.450–4.500)

## 2021-08-02 ENCOUNTER — Ambulatory Visit
Admission: RE | Admit: 2021-08-02 | Discharge: 2021-08-02 | Disposition: A | Discharge status: Home / Self Care | Payer: BC Managed Care – PPO | Source: Ambulatory Visit | Attending: Family Medicine | Admitting: Family Medicine

## 2021-08-02 ENCOUNTER — Other Ambulatory Visit: Payer: Self-pay

## 2021-08-02 DIAGNOSIS — R102 Pelvic and perineal pain: Secondary | ICD-10-CM | POA: Insufficient documentation

## 2021-08-19 ENCOUNTER — Ambulatory Visit (INDEPENDENT_AMBULATORY_CARE_PROVIDER_SITE_OTHER): Payer: BC Managed Care – PPO | Admitting: Family Medicine

## 2021-08-19 ENCOUNTER — Other Ambulatory Visit: Payer: Self-pay

## 2021-08-19 VITALS — BP 101/65 | HR 66

## 2021-08-19 DIAGNOSIS — Z30430 Encounter for insertion of intrauterine contraceptive device: Secondary | ICD-10-CM

## 2021-08-19 DIAGNOSIS — N939 Abnormal uterine and vaginal bleeding, unspecified: Secondary | ICD-10-CM

## 2021-08-19 LAB — POCT URINE PREGNANCY: Preg Test, Ur: NEGATIVE

## 2021-08-19 MED ORDER — LEVONORGESTREL 20 MCG/DAY IU IUD
1.0000 | INTRAUTERINE_SYSTEM | Freq: Once | INTRAUTERINE | Status: AC
  Administered 2021-08-19: 1 via INTRAUTERINE

## 2021-08-19 NOTE — Progress Notes (Signed)
   Subjective:    Patient ID: Danielle Esparza is a 40 y.o. female presenting with Contraception  on 08/19/2021  HPI: Here for f/u. Initially placed on POPs with much breakthrough bleeding. She has been bleeding x 2 months. She desires a different form of contraception.  Review of Systems  Constitutional:  Negative for chills and fever.  Respiratory:  Negative for shortness of breath.   Cardiovascular:  Negative for chest pain.  Gastrointestinal:  Negative for abdominal pain, nausea and vomiting.  Genitourinary:  Negative for dysuria.  Skin:  Negative for rash.     Objective:    BP 101/65   Pulse 66  Physical Exam Constitutional:      General: She is not in acute distress.    Appearance: She is well-developed.  HENT:     Head: Normocephalic and atraumatic.  Eyes:     General: No scleral icterus. Cardiovascular:     Rate and Rhythm: Normal rate.  Pulmonary:     Effort: Pulmonary effort is normal.  Abdominal:     Palpations: Abdomen is soft.  Musculoskeletal:     Cervical back: Neck supple.  Skin:    General: Skin is warm and dry.  Neurological:     Mental Status: She is alert and oriented to person, place, and time.   Procedure: Patient identified, informed consent performed, signed copy in chart, time out was performed.  Urine pregnancy test negative.  Speculum placed in the vagina.  Cervix visualized.  Cleaned with Betadine x 2.  Grasped anteriourly with a single tooth tenaculum.  Uterus sounded to 8.  Mirena IUD placed per manufacturer's recommendations.  Strings trimmed to 3 cm.   Patient given post procedure instructions and Mirena care card with expiration date.  Patient is asked to check IUD strings periodically and follow up in 4-6 weeks for IUD check.      Assessment & Plan:   Problem List Items Addressed This Visit       Unprioritized   Abnormal uterine bleeding    On POPs. Normal u/s Change to Mirena--per patient request to help with this.  Usual expectations reviewed.      Other Visit Diagnoses     Encounter for IUD insertion    -  Primary   Hopefully this helps with bleeding--usual spottingduring first few months noted.   Relevant Orders   POCT urine pregnancy (Completed)        Return in about 4 weeks (around 09/16/2021) for iud check.  Reva Bores 08/21/2021 12:54 PM

## 2021-08-19 NOTE — Progress Notes (Signed)
Last unprotected sex 8/6

## 2021-08-21 ENCOUNTER — Encounter: Payer: Self-pay | Admitting: Family Medicine

## 2021-08-21 DIAGNOSIS — N939 Abnormal uterine and vaginal bleeding, unspecified: Secondary | ICD-10-CM | POA: Insufficient documentation

## 2021-08-21 NOTE — Assessment & Plan Note (Signed)
On POPs. Normal u/s Change to Mirena--per patient request to help with this. Usual expectations reviewed.

## 2021-09-16 ENCOUNTER — Encounter: Payer: Self-pay | Admitting: Family Medicine

## 2021-09-16 ENCOUNTER — Other Ambulatory Visit: Payer: Self-pay

## 2021-09-16 ENCOUNTER — Ambulatory Visit (INDEPENDENT_AMBULATORY_CARE_PROVIDER_SITE_OTHER): Payer: BC Managed Care – PPO | Admitting: Family Medicine

## 2021-09-16 VITALS — BP 107/73 | HR 69 | Wt 150.6 lb

## 2021-09-16 DIAGNOSIS — Z30431 Encounter for routine checking of intrauterine contraceptive device: Secondary | ICD-10-CM

## 2021-09-16 NOTE — Progress Notes (Signed)
   Subjective:    Patient ID: Danielle Esparza is a 40 y.o. female presenting with No chief complaint on file.  on 09/16/2021  HPI: Had IUD placed a few weeks ago. Going well. Little spotting and mild cramping but tolerating well.  Review of Systems  Constitutional:  Negative for chills and fever.  Respiratory:  Negative for shortness of breath.   Cardiovascular:  Negative for chest pain.  Gastrointestinal:  Negative for abdominal pain, nausea and vomiting.  Genitourinary:  Negative for dysuria.  Skin:  Negative for rash.     Objective:    BP 107/73   Pulse 69   Wt 150 lb 9.6 oz (68.3 kg)   LMP 09/16/2021   BMI 25.26 kg/m  Physical Exam Constitutional:      General: She is not in acute distress.    Appearance: She is well-developed.  HENT:     Head: Normocephalic and atraumatic.  Eyes:     General: No scleral icterus. Cardiovascular:     Rate and Rhythm: Normal rate.  Pulmonary:     Effort: Pulmonary effort is normal.  Abdominal:     Palpations: Abdomen is soft.  Genitourinary:    Comments: NEFG, vagina with ruggae, normal appearing cervix, IUD strings noted. Musculoskeletal:     Cervical back: Neck supple.  Skin:    General: Skin is warm and dry.  Neurological:     Mental Status: She is alert and oriented to person, place, and time.        Assessment & Plan:  IUD check up - correct placement--working well, continue  Return if symptoms worsen or fail to improve.  Reva Bores 09/16/2021 4:15 PM

## 2021-10-21 ENCOUNTER — Encounter: Payer: Self-pay | Admitting: Radiology

## 2022-07-06 ENCOUNTER — Encounter: Payer: Self-pay | Admitting: Family Medicine

## 2022-11-11 IMAGING — US US PELVIS COMPLETE WITH TRANSVAGINAL
1 series · 14 of 25 positions shown · non-contrast
Comparison: None

CLINICAL DATA: Pelvic pain and abnormal bleeding for months, LMP
07/26/2021

EXAM:
TRANSABDOMINAL AND TRANSVAGINAL ULTRASOUND OF PELVIS
TECHNIQUE: Both transabdominal and transvaginal ultrasound examinations of the
pelvis were performed. Transabdominal technique was performed for
global imaging of the pelvis including uterus, ovaries, adnexal
regions, and pelvic cul-de-sac. It was necessary to proceed with
endovaginal exam following the transabdominal exam to visualize the
endometrium and ovaries.

[Series 1: us pelvis complete with transvaginal · 0.17mm/px · 14 of 139 slices shown]
[im 1/139]
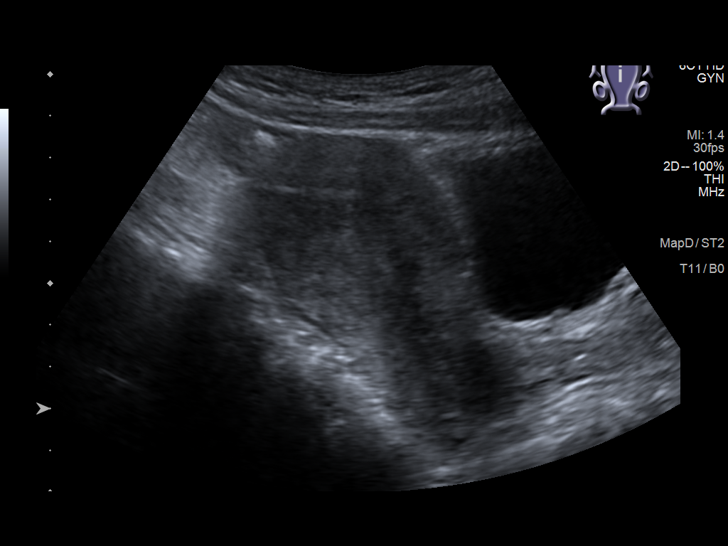
[im 12/139]
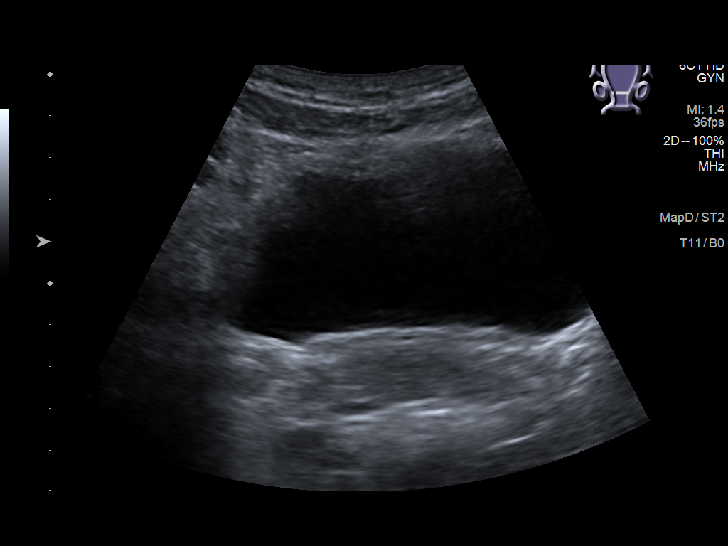
[im 24/139]
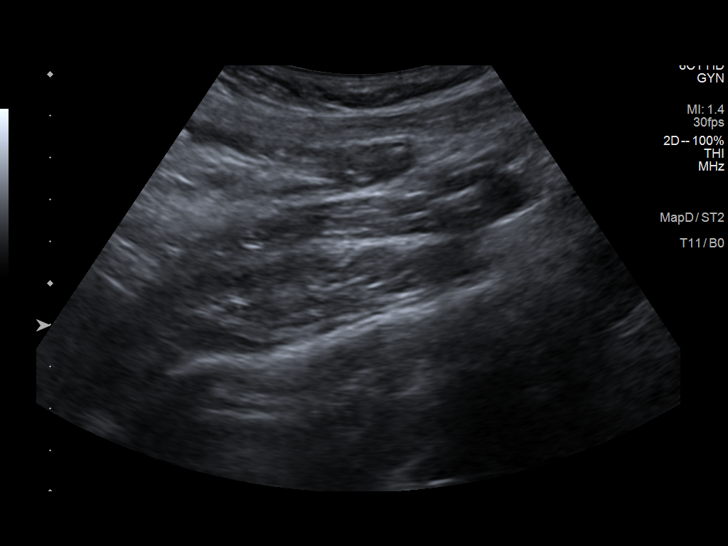
[im 35/139]
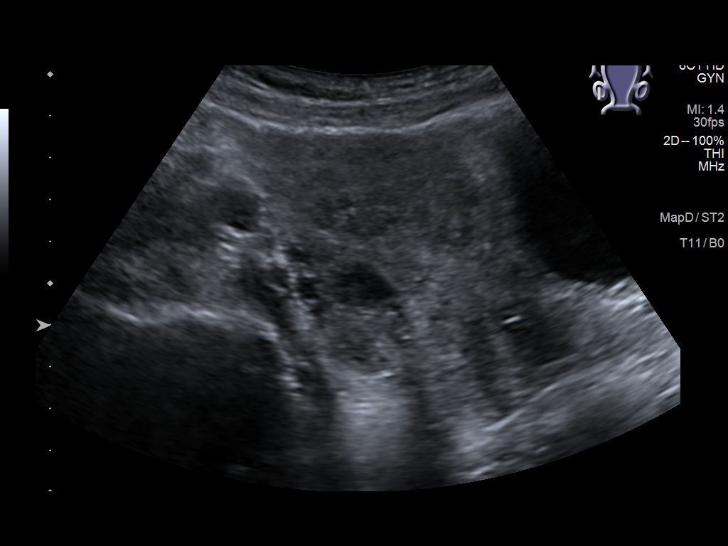
[im 47/139]
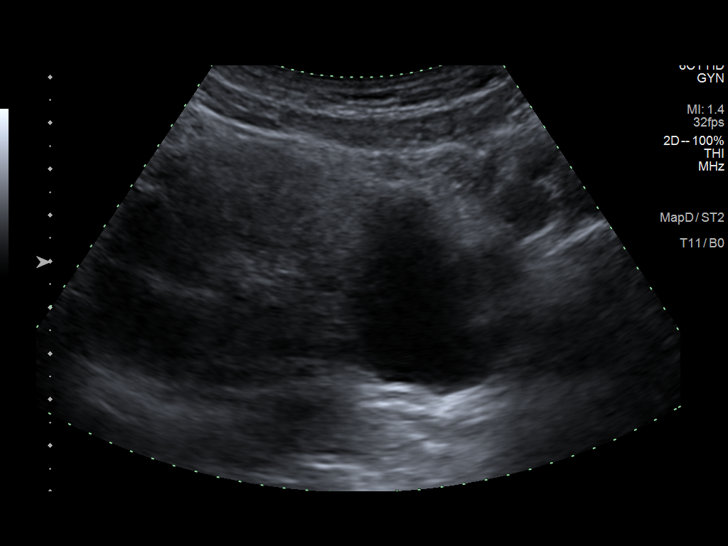
[im 52/139]
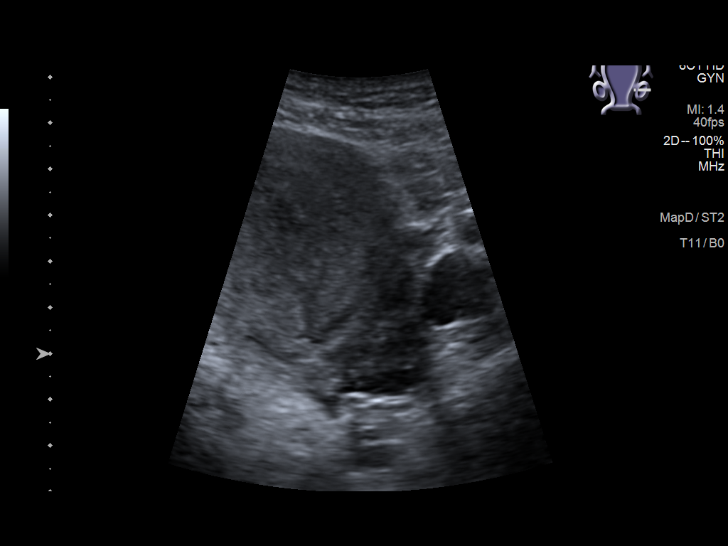
[im 64/139]
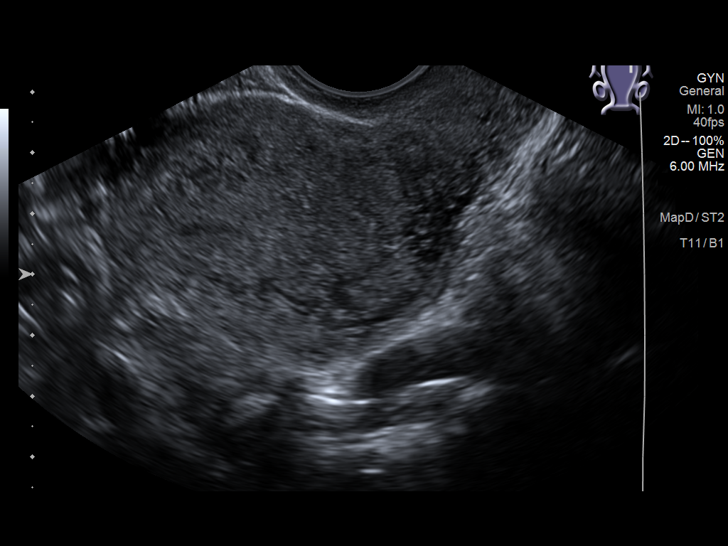
[im 75/139]
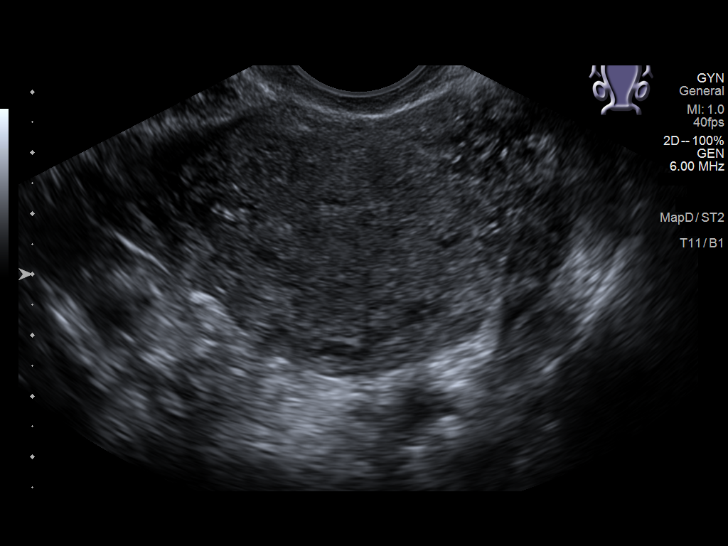
[im 87/139]
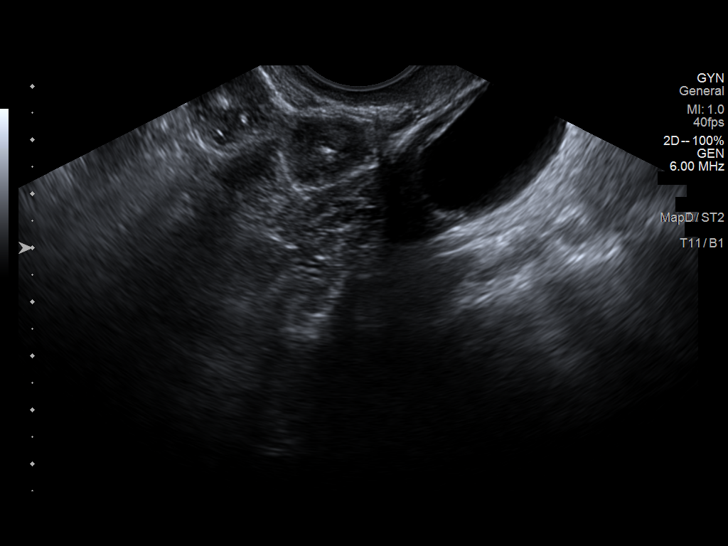
[im 93/139]
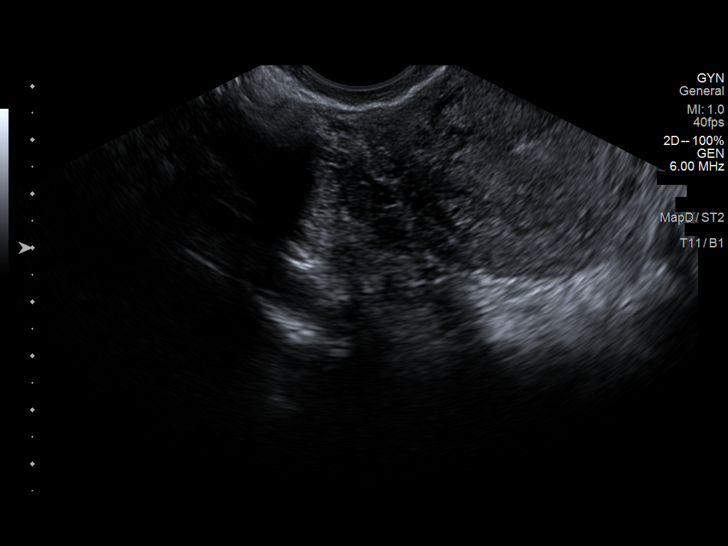
[im 104/139]
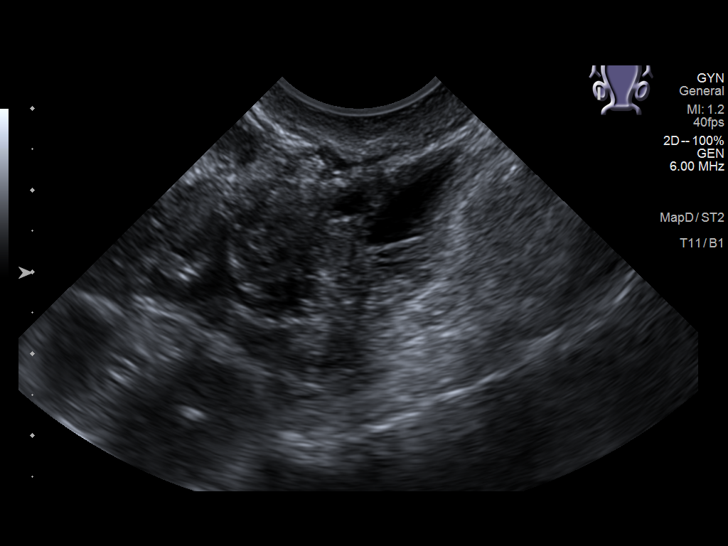
[im 116/139]
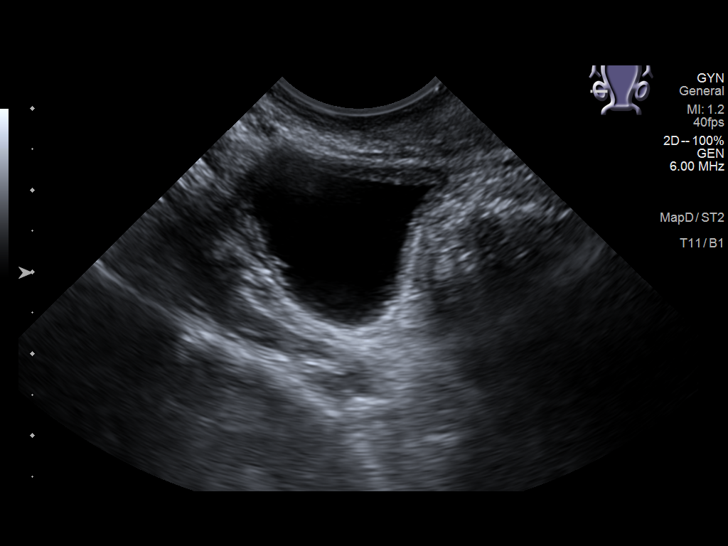
[im 127/139]
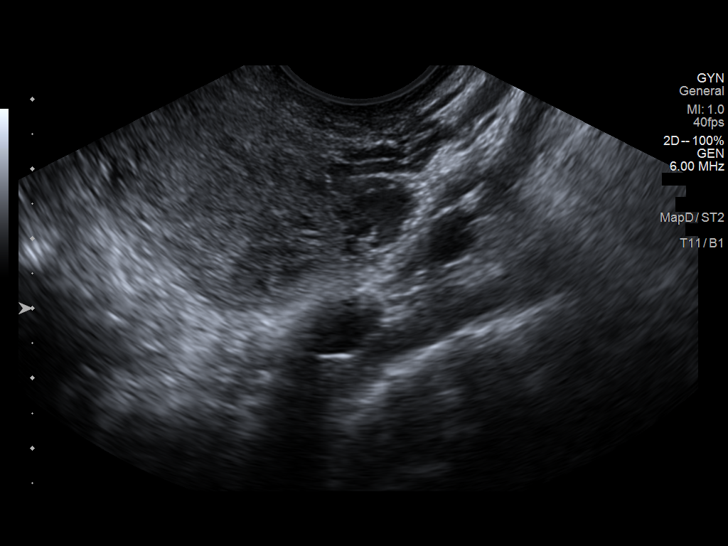
[im 139/139]
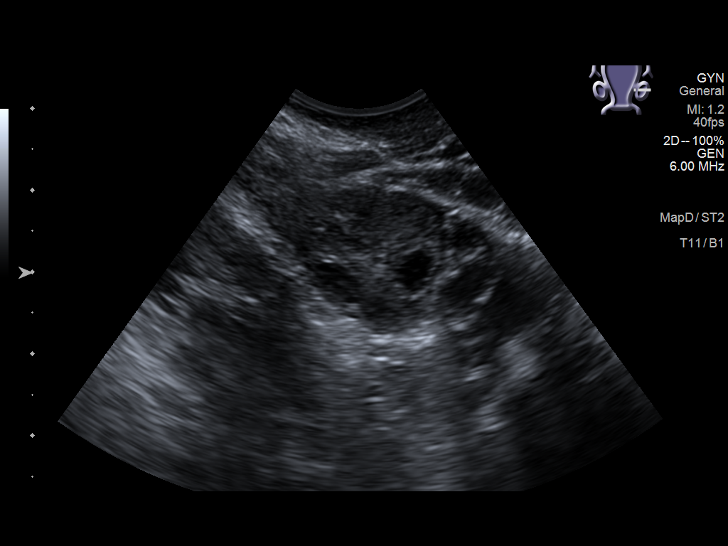

[14 of 25 positions shown; findings below may reference images not displayed]

FINDINGS: Uterus

Measurements: 8.4 x 5.0 x 6.2 cm = volume: 137 mL. Anteverted.
Hetereogenous myometrium. No focal mass.

Endometrium

Thickness: 3 mm.  Trace endometrial fluid.  No focal mass.

Right ovary

Measurements: 3.7 x 2.4 x 3.0 cm = volume: 13.6 mL. Dominant mature
follicle 3.2 cm greatest diameter without additional mass; no
followup imaging recommended.

Left ovary

Measurements: 2.8 x 1.3 x 2.0 cm = volume: 3.8 mL. No morphology
without mass.

Other findings

No free pelvic fluid.  No adnexal masses.
IMPRESSION: No pelvic sonographic abnormalities identified.

## 2023-09-07 ENCOUNTER — Telehealth: Payer: BC Managed Care – PPO | Admitting: Physician Assistant

## 2023-09-07 ENCOUNTER — Ambulatory Visit: Payer: Self-pay | Admitting: *Deleted

## 2023-09-07 DIAGNOSIS — K1120 Sialoadenitis, unspecified: Secondary | ICD-10-CM

## 2023-09-07 MED ORDER — AMOXICILLIN-POT CLAVULANATE 875-125 MG PO TABS: 1.0000 | ORAL_TABLET | Freq: Two times a day (BID) | ORAL | 0 refills | Status: DC

## 2023-09-07 NOTE — Patient Instructions (Signed)
Honore Deonn Lurlean Horns, thank you for joining Margaretann Loveless, PA-C for today's virtual visit.  While this provider is not your primary care provider (PCP), if your PCP is located in our provider database this encounter information will be shared with them immediately following your visit.   A [facility] MyChart account gives you access to today's visit and all your visits, tests, and labs performed at [facility] " click here if you don't have a [facility] MyChart account or go to [email or web address]  Consent: (Patient) Danielle Esparza provided verbal consent for this virtual visit at the beginning of the encounter.  Current Medications:  Current Outpatient Medications:    amoxicillin-clavulanate (AUGMENTIN) 875-125 MG tablet, Take 1 tablet by mouth 2 (two) times daily., Disp: 14 tablet, Rfl: 0   fluticasone (FLONASE) 50 MCG/ACT nasal spray, Place 1-2 sprays into both nostrils daily as needed for allergies or rhinitis., Disp: , Rfl:    NORLYDA 0.35 MG tablet, TAKE 1 TABLET(0.35 MG) BY MOUTH DAILY (Patient not taking: Reported on 09/16/2021), Disp: 28 tablet, Rfl: 11   polyethylene glycol powder (GLYCOLAX/MIRALAX) 17 GM/SCOOP powder, Take 17 g by mouth daily as needed. (Patient not taking: Reported on 09/16/2021), Disp: 510 g, Rfl: 1   Medications ordered in this encounter:  Meds ordered this encounter  Medications   amoxicillin-clavulanate (AUGMENTIN) 875-125 MG tablet    Sig: Take 1 tablet by mouth 2 (two) times daily.    Dispense:  14 tablet    Refill:  0    Order Specific Question:   Supervising Provider    Answer:   Merrilee Jansky [id number]     *If you need refills on other medications prior to your next appointment, please contact your pharmacy*  Follow-Up: Call back or seek an in-person evaluation if the symptoms worsen or if the condition fails to improve as anticipated.  [facility] [phone]  Other Instructions Salivary  Gland Infection  A salivary gland infection is an infection in one or more of the glands that make saliva. There are six major salivary glands. Each gland has a tube (duct) that carries saliva into the mouth. Saliva keeps the mouth moist and breaks down the food that a person eats. It also helps prevent tooth decay. You have two salivary glands just in front of your ears (parotid). The ducts for these glands open up inside your cheeks, near your back teeth. You also have two glands under your tongue (sublingual) and two glands under your jaw (submandibular). The ducts for these glands open under your tongue. Any salivary gland can get infected. Most infections occur in the parotid glands or submandibular glands. What are the causes? This condition may be caused by bacteria or viruses. The bacteria that cause salivary gland infections are usually the same bacteria that normally live in your mouth. A stone can form in a salivary gland and block the flow of saliva. Bacteria may then start to grow behind the blockage and cause infection. Bacterial infections usually cause pain and swelling on one side of the face. Submandibular gland swelling occurs under the jaw. Parotid swelling occurs in front of the ear. Bacterial infections are more common in adults. The most common cause of viral salivary gland infections is the mumps virus. However, vaccination has made mumps rare. This infection causes swelling in both parotid glands. Viral infections are more common in children. What increases the risk? You are more likely to develop a bacterial infection if:  You do not take good care of your mouth and teeth (have poor oral hygiene). You smoke. You do not drink enough water. You have a disease that causes dry mouth and dry eyes (Mikulicz syndrome or Sjgren syndrome). A viral infection is more likely to occur in children who do not get the measles, mumps, and rubella (MMR) vaccine. What are the signs or  symptoms? The main sign of a salivary gland infection is sialadenitis, which is swelling in a salivary gland. You may have swelling in front of your ear, under your jaw, or under your tongue. Swelling may get worse when you eat and decrease after you eat. Other signs and symptoms include: Pain. Tenderness. Redness. Dry mouth. Bad taste in your mouth. Trouble chewing and swallowing. Fever. How is this diagnosed? This condition may be diagnosed based on: Your signs and symptoms. A physical exam. During the exam, your health care provider will look and feel inside your mouth to see whether a stone is blocking a salivary gland duct. Tests, such as: An X-ray to check for a stone. An ultrasound, CT scan, or MRI to look for an infected gland (abscess) and to rule out other causes of swelling. A culture and sensitivity test. In this test, a sample of pus is taken from the salivary gland by using a swab or a needle (aspiration). The sample is tested in a lab to determine the type of bacteria involved and which antibiotic medicines will work against it. You may need to see an ear, nose, and throat specialist (ENT or otolaryngologist) for diagnosis and treatment. How is this treated? Viral salivary gland infections usually clear up without treatment. Bacterial infections are usually treated with antibiotic medicine. Severe infections that cause trouble swallowing may be treated with an IV antibiotic in the hospital. Other treatments may include: Probing and widening the salivary duct to let a stone pass. In some cases, a thin, flexible scope (endoscope) may be put into the duct to find a stone and remove it. Breaking up a stone using sound waves. Draining an abscess with a needle. Surgery to: Remove a stone. Drain pus from an abscess. Remove a gland that has a bad or long-term infection. Follow these instructions at home: Medicines Take over-the-counter and prescription medicines only as told by  your health care provider. If you were prescribed an antibiotic medicine, take it as told by your health care provider. Do not stop taking the antibiotic even if you start to feel better. To relieve discomfort Follow these instructions every few hours: Suck on a lemon candy or a vitamin C lozenge to prompt the flow of saliva. Put a warm, wet cloth (warmcompress) over the gland. Gently massage the gland. Gargle with a mixture of salt and water 3-4 times a day or as needed. To make salt water, completely dissolve -1 tsp (3-6 g) of salt in 1 cup (237 mL) of warm water. General instructions  Practice good oral hygiene by brushing and flossing your teeth after meals and before you go to bed. Drink enough fluid to keep your urine pale yellow. Do not use any products that contain nicotine or tobacco. These products include cigarettes, chewing tobacco, and vaping devices, such as e-cigarettes. If you need help quitting, ask your health care provider. Keep all follow-up visits. This is important. Contact a health care provider if: You have pain and swelling in your face, jaw, or mouth after eating. You keep having swelling in any of these places: In front of  your ear. Under your jaw. Inside your mouth. Get help right away if: You have pain and swelling in your face, jaw, or mouth that suddenly gets worse. Your pain and swelling make it hard to swallow, talk, or breathe. These symptoms may be an emergency. Get help right away. Call 911. Do not wait to see if the symptoms will go away. Do not drive yourself to the hospital. Summary A salivary gland infection is an infection in one or more of the glands that make saliva. Any salivary gland can get infected. This condition may be caused by bacteria or viruses. Salivary gland infections caused by a virus usually clear up without treatment. Bacterial infections are usually treated with antibiotic medicine. This information is not intended to replace  advice given to you by your health care provider. Make sure you discuss any questions you have with your health care provider. Document Revised: 11/24/2021 Document Reviewed: 11/24/2021 Elsevier Patient Education  2024 Elsevier Inc.    If you have been instructed to have an in-person evaluation today at a local Urgent Care facility, please use the link below. It will take you to a list of all of our available [facility], including address, phone number and hours of operation. Please do not delay care.  [facility]  If you or a family member do not have a primary care provider, use the link below to schedule a visit and establish care. When you choose a [facility] primary care physician or advanced practice provider, you gain a long-term partner in health. Find a Primary Care Provider  Learn more about [facility] in-office and virtual care options: [facility] Get Care Now

## 2023-09-07 NOTE — Telephone Encounter (Signed)
Summary: Swollen tonsils for 2 days   Pt is calling is report that tonsils swollen for 2 days. Yesterday with aches and chills, broke a temp today about 5am. Lump in the side of the neck has gone down. Does not hurt to talk. No available appts today. Pt is open to do a virtual appt today. Please advise

## 2023-09-07 NOTE — Progress Notes (Signed)
Virtual Visit Consent   Danielle Esparza, you are scheduled for a virtual visit with a [facility] provider today. Just as with appointments in the office, your consent must be obtained to participate. Your consent will be active for this visit and any virtual visit you may have with one of our providers in the next 365 days. If you have a MyChart account, a copy of this consent can be sent to you electronically.  As this is a virtual visit, video technology does not allow for your provider to perform a traditional examination. This may limit your provider's ability to fully assess your condition. If your provider identifies any concerns that need to be evaluated in person or the need to arrange testing (such as labs, EKG, etc.), we will make arrangements to do so. Although advances in technology are sophisticated, we cannot ensure that it will always work on either your end or our end. If the connection with a video visit is poor, the visit may have to be switched to a telephone visit. With either a video or telephone visit, we are not always able to ensure that we have a secure connection.  By engaging in this virtual visit, you consent to the provision of healthcare and authorize for your insurance to be billed (if applicable) for the services provided during this visit. Depending on your insurance coverage, you may receive a charge related to this service.  I need to obtain your verbal consent now. Are you willing to proceed with your visit today? Danielle Esparza has provided verbal consent on 09/07/2023 for a virtual visit (video or telephone). Margaretann Loveless, PA-C  Date: 09/07/2023 3:13 PM  Virtual Visit via Video Note   I, Margaretann Loveless, connected with  Danielle Esparza  ([id number], Oct 13, 1981) on 09/07/23 at  3:00 PM EDT by a video-enabled telemedicine application and verified that I am speaking with the correct person using two  identifiers.  Location: Patient: Virtual Visit Location Patient: Home Provider: Virtual Visit Location Provider: Home Office   I discussed the limitations of evaluation and management by telemedicine and the availability of in person appointments. The patient expressed understanding and agreed to proceed.    History of Present Illness: Danielle Esparza is a 42 y.o. who identifies as a female who was assigned female at birth, and is being seen today for sore throat.  HPI: Sore Throat  This is a new problem. The current episode started in the past 7 days (Tuesday evening, progressed yesterday). The problem has been gradually worsening. The pain is worse on the left side. Maximum temperature: subjective fever over night. The pain is moderate. Associated symptoms include congestion, ear pain (left ear pain), headaches and swollen glands. Pertinent negatives include no coughing, ear discharge, hoarse voice, plugged ear sensation or trouble swallowing. Associated symptoms comments: Eating made it worse. She has had no exposure to strep or mono. She has tried acetaminophen and NSAIDs for the symptoms. The treatment provided no relief.     Problems:  Patient Active Problem List   Diagnosis Date Noted   Abnormal uterine bleeding 08/21/2021   Pelvic pain 07/14/2021   Headache 11/17/2020   Hemoglobin A-S genotype/sickle cell trait 05/28/2015    Allergies: No Known Allergies Medications:  Current Outpatient Medications:    amoxicillin-clavulanate (AUGMENTIN) 875-125 MG tablet, Take 1 tablet by mouth 2 (two) times daily., Disp: 14 tablet, Rfl: 0   fluticasone (FLONASE) 50 MCG/ACT nasal spray, Place  1-2 sprays into both nostrils daily as needed for allergies or rhinitis., Disp: , Rfl:    NORLYDA 0.35 MG tablet, TAKE 1 TABLET(0.35 MG) BY MOUTH DAILY (Patient not taking: Reported on 09/16/2021), Disp: 28 tablet, Rfl: 11   polyethylene glycol powder (GLYCOLAX/MIRALAX) 17 GM/SCOOP powder, Take 17 g  by mouth daily as needed. (Patient not taking: Reported on 09/16/2021), Disp: 510 g, Rfl: 1  Observations/Objective: Patient is well-developed, well-nourished in no acute distress.  Resting comfortably at home.  Head is normocephalic, atraumatic.  No labored breathing.  Speech is clear and coherent with logical content.  Patient is alert and oriented at baseline.    Assessment and Plan: 1. Salivary gland infection - amoxicillin-clavulanate (AUGMENTIN) 875-125 MG tablet; Take 1 tablet by mouth 2 (two) times daily.  Dispense: 14 tablet; Refill: 0  - Suspect salivary stone with possible infection of the salivary gland - Augmentin prescribed - Advised to push fluids - Sour candies or lemon - May use tylenol and/or ibuprofen as needed - Warm compresses - Seek in person evaluation if continues to worsen or fails to resolve  Follow Up Instructions: I discussed the assessment and treatment plan with the patient. The patient was provided an opportunity to ask questions and all were answered. The patient agreed with the plan and demonstrated an understanding of the instructions.  A copy of instructions were sent to the patient via MyChart unless otherwise noted below.    The patient was advised to call back or seek an in-person evaluation if the symptoms worsen or if the condition fails to improve as anticipated.    Margaretann Loveless, PA-C

## 2023-09-07 NOTE — Telephone Encounter (Signed)
     Chief Complaint: Sore throat, lump left side Symptoms: Above Frequency: 2 days ago Pertinent Negatives: Patient denies fever Disposition: [] ED /[] Urgent Care (no appt availability in office) / [] Appointment(In office/virtual)/ [x]  [facility]/ [] Home Care/ [] Refused Recommended Disposition /[]  Mobile Bus/ []  Follow-up with PCP Additional Notes: Pt. Agrees with [facility].  Reason for Disposition  Earache also present  Answer Assessment - Initial Assessment Questions 1. ONSET: "When did the throat start hurting?" (Hours or days ago)      2 days 2. SEVERITY: "How bad is the sore throat?" (Scale 1-10; mild, moderate or severe)   - MILD (1-3):  Doesn't interfere with eating or normal activities.   - MODERATE (4-7): Interferes with eating some solids and normal activities.   - SEVERE (8-10):  Excruciating pain, interferes with most normal activities.   - SEVERE WITH DYSPHAGIA (10): Can't swallow liquids, drooling.     Mild 3. STREP EXPOSURE: "Has there been any exposure to strep within the past week?" If Yes, ask: "What type of contact occurred?"      No 4.  VIRAL SYMPTOMS: "Are there any symptoms of a cold, such as a runny nose, cough, hoarse voice or red eyes?"      Lump 5. FEVER: "Do you have a fever?" If Yes, ask: "What is your temperature, how was it measured, and when did it start?"     No 6. PUS ON THE TONSILS: "Is there pus on the tonsils in the back of your throat?"     No 7. OTHER SYMPTOMS: "Do you have any other symptoms?" (e.g., difficulty breathing, headache, rash)     Left ear pain 8. PREGNANCY: "Is there any chance you are pregnant?" "When was your last menstrual period?"     No  Protocols used: Sore Throat-A-AH

## 2023-09-19 ENCOUNTER — Encounter: Payer: Self-pay | Admitting: Obstetrics & Gynecology

## 2023-09-19 ENCOUNTER — Other Ambulatory Visit (HOSPITAL_COMMUNITY)
Admission: RE | Admit: 2023-09-19 | Discharge: 2023-09-19 | Disposition: A | Discharge status: Home / Self Care | Payer: BC Managed Care – PPO | Source: Ambulatory Visit | Attending: Obstetrics & Gynecology | Admitting: Obstetrics & Gynecology

## 2023-09-19 ENCOUNTER — Ambulatory Visit: Payer: BC Managed Care – PPO | Admitting: Obstetrics & Gynecology

## 2023-09-19 VITALS — BP 119/79 | HR 58 | Wt 120.0 lb

## 2023-09-19 DIAGNOSIS — Z01419 Encounter for gynecological examination (general) (routine) without abnormal findings: Secondary | ICD-10-CM

## 2023-09-19 DIAGNOSIS — Z1151 Encounter for screening for human papillomavirus (HPV): Secondary | ICD-10-CM

## 2023-09-19 DIAGNOSIS — Z30431 Encounter for routine checking of intrauterine contraceptive device: Secondary | ICD-10-CM | POA: Diagnosis not present

## 2023-09-19 DIAGNOSIS — Z113 Encounter for screening for infections with a predominantly sexual mode of transmission: Secondary | ICD-10-CM | POA: Diagnosis not present

## 2023-09-19 DIAGNOSIS — Z1231 Encounter for screening mammogram for malignant neoplasm of breast: Secondary | ICD-10-CM | POA: Diagnosis not present

## 2023-09-19 NOTE — Progress Notes (Signed)
GYNECOLOGY ANNUAL PREVENTATIVE CARE ENCOUNTER NOTE  History:     Danielle Esparza is a 42 y.o. [phone] female here for a routine annual gynecologic exam.  Current complaints: some cramping recently but not currently, wants to make sure IUD is in place. Desires annual STI screen.   Denies abnormal vaginal bleeding, discharge, pelvic pain, problems with intercourse or other gynecologic concerns.    Gynecologic History No LMP recorded. (Menstrual status: IUD). Contraception: Mirena IUD placed 08/19/2021, also to help with AUB. Last Pap: 11/17/2020. Result was normal with negative HPV  Obstetric History OB History  Gravida Para Term Preterm AB Living  4 2 2  0 2 2  SAB IAB Ectopic Multiple Live Births  2 0 0 0 2    # Outcome Date GA Lbr Len/2nd Weight Sex Type Anes PTL Lv  4 Term 06/27/20 [redacted]w[redacted]d 01:30 / 00:16 7 lb 8.8 oz (3.425 kg) F Vag-Spont EPI  LIV  3 SAB 06/27/19          2 SAB 01/2017          1 Term 01/02/16 [redacted]w[redacted]d 12:50 / 00:18 7 lb 5.3 oz (3.325 kg) F Vag-Spont EPI  LIV     Birth Comments: wnl    Obstetric Comments  Menstrual age: 89    Age 1st Pregnancy: N/A    Past Medical History:  Diagnosis Date   Allergy    seasonal   Hemoglobin A-S genotype/sickle cell trait 05/28/2015   Nipple discharge 2014    Past Surgical History:  Procedure Laterality Date   NO PAST SURGERIES      Current Outpatient Medications on File Prior to Visit  Medication Sig Dispense Refill   amoxicillin-clavulanate (AUGMENTIN) 875-125 MG tablet Take 1 tablet by mouth 2 (two) times daily. 14 tablet 0   levonorgestrel (MIRENA) 20 MCG/DAY IUD 1 each by Intrauterine route once.     No current facility-administered medications on file prior to visit.    No Known Allergies  Social History:  reports that she has never smoked. She has never used smokeless tobacco. She reports that she does not drink alcohol and does not use drugs.  Family History  Problem Relation Age of Onset   Prostate  cancer Father    Sarcoidosis Father    Breast cancer Paternal Grandmother    Diabetes Paternal Grandmother    Ovarian cancer Maternal Aunt 48   Diabetes Paternal Grandfather    Diabetes Maternal Grandmother     The following portions of the patient's history were reviewed and updated as appropriate: allergies, current medications, past family history, past medical history, past social history, past surgical history and problem list.  Review of Systems Pertinent items noted in HPI and remainder of comprehensive ROS otherwise negative.  Physical Exam:  BP 119/79   Pulse (!) 58   Wt 120 lb (54.4 kg)   BMI 20.12 kg/m  CONSTITUTIONAL: Well-developed, well-nourished female in no acute distress.  HENT:  Normocephalic, atraumatic, External right and left ear normal.  EYES: Conjunctivae and EOM are normal. Pupils are equal, round, and reactive to light. No scleral icterus.  NECK: Normal range of motion, supple, no masses.  Normal thyroid.  SKIN: Skin is warm and dry. No rash noted. Not diaphoretic. No erythema. No pallor. MUSCULOSKELETAL: Normal range of motion. No tenderness.  No cyanosis, clubbing, or edema. NEUROLOGIC: Alert and oriented to person, place, and time. Normal reflexes, muscle tone coordination.  PSYCHIATRIC: Normal mood and affect. Normal behavior. Normal  judgment and thought content. CARDIOVASCULAR: Normal heart rate noted, regular rhythm RESPIRATORY: Clear to auscultation bilaterally. Effort and breath sounds normal, no problems with respiration noted. BREASTS: Symmetric in size. No masses, tenderness, skin changes, nipple drainage, or lymphadenopathy bilaterally. Performed in the presence of a chaperone. ABDOMEN: Soft, no distention noted.  No tenderness, rebound or guarding.  PELVIC: Normal appearing external genitalia and urethral meatus; normal appearing vaginal mucosa and cervix.  No abnormal vaginal discharge noted. IUD strings seen about 3 cm in length outside cervix.   Pap smear obtained.  Normal uterine size, no other palpable masses, no uterine or adnexal tenderness.  Performed in the presence of a chaperone.   Assessment and Plan:     1. Surveillance of intrauterine contraception IUD strings in place, patient reassured.  2. Routine screening for STI (sexually transmitted infection) Annual STI screen done, will follow up results and manage accordingly. - Cytology - PAP ancillary testing - RPR+HBsAg+HCVAb+HIV  3. Breast cancer screening by mammogram Mammogram scheduled for breast cancer screening. - MM 3D SCREENING MAMMOGRAM BILATERAL BREAST; Future  4. Well woman exam with routine gynecological exam - Cytology - PAP Will follow up results of pap smear and manage accordingly. Routine preventative health maintenance measures emphasized. Please refer to After Visit Summary for other counseling recommendations.      Jaynie Collins, MD, FACOG Obstetrician & Gynecologist, [facility] for [employer], [facility]

## 2023-09-20 LAB — RPR+HBSAG+HCVAB+...
HIV Screen 4th Generation wRfx: NONREACTIVE
Hep C Virus Ab: NONREACTIVE
Hepatitis B Surface Ag: NEGATIVE
RPR Ser Ql: NONREACTIVE

## 2023-09-25 LAB — CYTOLOGY - PAP
Chlamydia: NEGATIVE
Comment: NEGATIVE
Comment: NEGATIVE
Comment: NEGATIVE
Comment: NORMAL
Diagnosis: NEGATIVE
High risk HPV: NEGATIVE
Neisseria Gonorrhea: NEGATIVE
Trichomonas: NEGATIVE

## 2023-12-14 ENCOUNTER — Ambulatory Visit
Admission: RE | Admit: 2023-12-14 | Discharge: 2023-12-14 | Disposition: A | Discharge status: Home / Self Care | Payer: 59 | Source: Ambulatory Visit | Attending: Obstetrics & Gynecology | Admitting: Obstetrics & Gynecology

## 2023-12-14 DIAGNOSIS — Z1231 Encounter for screening mammogram for malignant neoplasm of breast: Secondary | ICD-10-CM | POA: Diagnosis present

## 2024-03-19 ENCOUNTER — Ambulatory Visit: Payer: Self-pay

## 2024-03-19 NOTE — Telephone Encounter (Signed)
 Chief Complaint: Headche Symptoms: Headache, tooth discomfort Frequency: 2 days Pertinent Negatives: Patient denies fever, neck stiffness, sore throat, cold/flu symptoms Disposition: [] ED /[] Urgent Care (no appt availability in office) / [x] Appointment(In office/virtual)/ []  [facility]/ [] Home Care/ [] Refused Recommended Disposition /[] [place] [facility]/ []  Follow-up with PCP Additional Notes: Patient called in to make an appt for an ongoing headache for two nights. Patient states the headache is quite a bit worse when lying down, and stating she would rate it a 10/10 until she takes naproxen. Patient has never been diagnosed with migraine headaches before, but states she has had a similar issue in the past with a bad tooth. Patient states she currently has a bad tooth on her right side, and the headache occurs on her right side from her temple and wraps around the back. Patient advised to be seen asap. Patient was dismissed from [facility] due to length of time since last visit. This RN confirmed with staff via clinic access line. Assisted patient in making a new patient appt for tomorrow to be evaluated for new establishment and for acute symptoms.    Copied from CRM [id number]. Topic: Appointments - Appointment Scheduling >> Mar 19, 2024  2:54 PM Priscille Loveless wrote: Patient is experiencing possible migraine but not sure. The pain start out in the front and radiates to the back, this is all on the right side. When it reaches the back it is less intense than when it started. Pt has not done anything to cause this. Reason for Disposition  [1] MODERATE headache (e.g., interferes with normal activities) AND [2] present > 24 hours AND [3] unexplained  (Exceptions: analgesics not tried, typical migraine, or headache part of viral illness)  Answer Assessment - Initial Assessment Questions 1. LOCATION: "Where does it hurt?"      From the front to the back, starts above temple and  wraps around behind ear on right side 2. ONSET: "When did the headache start?" (Minutes, hours or days)      Last two nights  3. PATTERN: "Does the pain come and go, or has it been constant since it started?"     Comes and goes - worsened at night when lying down 4. SEVERITY: "How bad is the pain?" and "What does it keep you from doing?"  (e.g., Scale 1-10; mild, moderate, or severe)   - MILD (1-3): doesn't interfere with normal activities    - MODERATE (4-7): interferes with normal activities or awakens from sleep    - SEVERE (8-10): excruciating pain, unable to do any normal activities        10 5. RECURRENT SYMPTOM: "Have you ever had headaches before?" If Yes, ask: "When was the last time?" and "What happened that time?"      Patient states she doesn't typically get headaches 6. CAUSE: "What do you think is causing the headache?"     no 7. MIGRAINE: "Have you been diagnosed with migraine headaches?" If Yes, ask: "Is this headache similar?"      No 8. HEAD INJURY: "Has there been any recent injury to the head?"      No 9. OTHER SYMPTOMS: "Do you have any other symptoms?" (fever, stiff neck, eye pain, sore throat, cold symptoms)     Tooth problem on right side  Protocols used: Headache-A-AH

## 2024-03-20 ENCOUNTER — Ambulatory Visit (INDEPENDENT_AMBULATORY_CARE_PROVIDER_SITE_OTHER): Admitting: Family Medicine

## 2024-03-20 ENCOUNTER — Encounter: Payer: Self-pay | Admitting: Family Medicine

## 2024-03-20 VITALS — BP 115/76 | HR 56 | Temp 98.5°F | Ht 64.0 in | Wt 128.2 lb

## 2024-03-20 DIAGNOSIS — Z0000 Encounter for general adult medical examination without abnormal findings: Secondary | ICD-10-CM

## 2024-03-20 DIAGNOSIS — K0889 Other specified disorders of teeth and supporting structures: Secondary | ICD-10-CM | POA: Diagnosis not present

## 2024-03-20 DIAGNOSIS — R519 Headache, unspecified: Secondary | ICD-10-CM

## 2024-03-20 DIAGNOSIS — Z113 Encounter for screening for infections with a predominantly sexual mode of transmission: Secondary | ICD-10-CM | POA: Diagnosis not present

## 2024-03-20 DIAGNOSIS — Z7689 Persons encountering health services in other specified circumstances: Secondary | ICD-10-CM | POA: Diagnosis not present

## 2024-03-20 NOTE — Patient Instructions (Signed)

## 2024-03-20 NOTE — Progress Notes (Signed)
 New Patient Office Visit  Subjective   Patient ID: Danielle Esparza, female    DOB: [birth year]  Age: 43 y.o. MRN: [id number]  CC:  Chief Complaint  Patient presents with   Establish Care    Pt. Her to establish care.    Headache    Pt. Stats of having headaches that are sharp on the rt. side that radiates to the back of the head that started 03/17/24 morning.    Dental Pain    Pt. C/o of dental pain that started 1 week ago.     HPI Danielle Esparza is a 43 year old female who presents to establish with [facility].   CC: Patient here to establish care  Last PCP: [facility]  Specialists: OBGYN   Tooth pain started first- about 1 week ago and was intermittent. Did not take any medication for it.  Right side- upper part of jaw Dentist appt- plans to call today to schedule   Patient is currently involved in counseling.  Currently going through separation   HEADACHE:  Danielle Esparza presents today for reports of a new onset headache. Denies frequent headaches in a month and history of migraines.  Headache status at time of visit: asymptomatic Ocular symptoms: Denies any vision loss, eye pain, blurred vision Neurologic changes: Denies any numbness or tingling, difficulties, gait abnormalities Family history: Denies any family history of headaches or migraines Denies current headache, vision changes, sensitivity to light and sound, aura, N/V.  Quality: dull, sharp pain but does not hurt  It will be there for about 30 minutes and then subside and move to the back of her head  Pain 10/10 when it was present  It did wake her out of her sleep a couple nights ago Headache started 4/6 and subsided today  Naproxen- eventually helped   Associated Symptoms Aura: no Nausea:  no Vomiting: no Photophobia:  no Phonophobia:  no Sinus pain/pressure: no  Family hx migraine: no  Personal stressors: no  Relation to  menstrual cycle: no  Fever/New Rash: no   Red Flags Fever: no  Neck pain/stiffness: no  Vision/speech/swallow/hearing difficulty: no  Focal weakness/numbness: no  Altered mental status: no  Trauma: no  New type of headache: no  Anticoagulant use: no  H/o cancer/HIV/Pregnancy: no  Effect on social functioning:  no Confusion:  no Gait disturbance/ataxia:  no Behavioral changes:  no Fevers:  no   Outpatient Encounter Medications as of 03/20/2024  Medication Sig   levonorgestrel (MIRENA) 20 MCG/DAY IUD 1 each by Intrauterine route once.   [DISCONTINUED] amoxicillin-clavulanate (AUGMENTIN) 875-125 MG tablet Take 1 tablet by mouth 2 (two) times daily. (Patient not taking: Reported on 03/20/2024)   No facility-administered encounter medications on file as of 03/20/2024.    Patient Active Problem List   Diagnosis Date Noted   Abnormal uterine bleeding 08/21/2021   Hemoglobin A-S genotype/sickle cell trait 05/28/2015   Past Medical History:  Diagnosis Date   Allergy    seasonal   Hemoglobin A-S genotype/sickle cell trait 05/28/2015   Nipple discharge 2014   Past Surgical History:  Procedure Laterality Date   NO PAST SURGERIES     Family History  Problem Relation Age of Onset   Prostate cancer Father    Sarcoidosis Father    Breast cancer Paternal Grandmother    Diabetes Paternal Grandmother    Ovarian cancer Maternal Aunt 92   Diabetes Paternal Grandfather  Diabetes Maternal Grandmother    Social History   Socioeconomic History   Marital status: Legally Separated    Spouse name: Not on file   Number of children: Not on file   Years of education: Not on file   Highest education level: Not on file  Occupational History   Not on file  Tobacco Use   Smoking status: Never   Smokeless tobacco: Never  Vaping Use   Vaping status: Never Used  Substance and Sexual Activity   Alcohol use: No   Drug use: No   Sexual activity: Yes    Partners: Male    Birth  control/protection: I.U.D.  Other Topics Concern   Not on file  Social History Narrative   Not on file   Social Drivers of Health   Financial Resource Strain: Not on file  Food Insecurity: Not on file  Transportation Needs: Not on file  Physical Activity: Not on file  Stress: Not on file  Social Connections: Not on file  Intimate Partner Violence: Not on file   Outpatient Medications Prior to Visit  Medication Sig Dispense Refill   levonorgestrel (MIRENA) 20 MCG/DAY IUD 1 each by Intrauterine route once.     amoxicillin-clavulanate (AUGMENTIN) 875-125 MG tablet Take 1 tablet by mouth 2 (two) times daily. (Patient not taking: Reported on 03/20/2024) 14 tablet 0   No facility-administered medications prior to visit.   No Known Allergies  ROS   Objective   Today's Vitals   03/20/24 0837  BP: 115/76  Pulse: (!) 56  Temp: 98.5 F (36.9 C)  TempSrc: Oral  SpO2: 98%  Weight: 128 lb 3.2 oz (58.2 kg)  Height: 5\' 4"  (1.626 m)  PainSc: 10-Worst pain ever  PainLoc: Head   Physical Exam Vitals reviewed.  Constitutional:      Appearance: She is well-developed.  HENT:     Mouth/Throat:     Lips: Pink.     Mouth: Mucous membranes are moist.     Dentition: Abnormal dentition. No gingival swelling, dental abscesses or gum lesions.     Tongue: No lesions.     Palate: No mass and lesions.     Pharynx: Oropharynx is clear. Uvula midline.     Tonsils: No tonsillar exudate.   Cardiovascular:     Rate and Rhythm: Normal rate and regular rhythm.     Pulses: Normal pulses.     Heart sounds: Normal heart sounds.  Pulmonary:     Effort: Pulmonary effort is normal.     Breath sounds: Normal breath sounds.  Neurological:     Cranial Nerves: Cranial nerves 2-12 are intact.     Sensory: Sensation is intact.     Motor: Motor function is intact.     Coordination: Coordination is intact.     Gait: Gait is intact.     Deep Tendon Reflexes: Reflexes are normal and symmetric.   Psychiatric:        Mood and Affect: Mood normal.        Behavior: Behavior normal.        Assessment & Plan:   1. Encounter to establish care (Primary) Patient is a 43- year-old female who presents today to establish care with primary care at [facility]. Reviewed the past medical history, family history, social history, surgical history, medications and allergies today- updates made as indicated. Patient has concerns today about a headache that occurred for about 3 days and right-sided tooth pain.   2. Acute nonintractable headache, unspecified headache  type Patient presents today for concerns regarding an acute headache that she is not currently experiencing. She reports when this headache occurred, it was on the right side of her head and it was brief and felt like a stabbing sensation. Some relief with naproxen. Denies photophobia, phonophobia, nausea/vomiting, focal weakness, blurred or double vision, speech difficulties, or confusion. CN II-XII intact with no acute abnormalities present during exam. Motor, sensation, coordination, and gait intact. Discussed symptomatic management of acute headache and emergency precautions. Advised to return to office if headaches persist or worsen after tooth is assessed by specialist.   3. Tooth pain Physical exam with right upper posterior-most molar visibly cracked. Denies lymphadenopathy, fever/chills, throat pain. She reports that the tooth pain has improved and is no longer causing her discomfort. She reports this occurred after eating popcorn and plans to schedule with a dentist today. No swelling, erythema, or signs of infection present. No cervical lymphadenopathy.   4. Wellness examination Orders placed for patient to obtain fasting labs one week prior to annual physical exam.  - CBC with Differential/Platelet; Future - Comprehensive metabolic panel with GFR; Future - Hemoglobin A1c; Future - Lipid panel; Future - TSH Rfx on Abnormal to Free  T4; Future  5. Screen for sexually transmitted diseases Patient would like to complete STI screening with annual physical labs. No acute concerns.  - Hepatitis B surface antibody,qualitative; Future - Hepatitis B core antibody, total; Future - Hepatitis B surface antigen; Future - HCV RNA quant rflx ultra or genotyp; Future - HIV Antibody (routine testing w rflx); Future - RPR; Future  Return in about 6 weeks (around 05/01/2024) for Physical with fasting labs.   Alyson Reedy, FNP

## 2024-04-18 ENCOUNTER — Encounter (HOSPITAL_COMMUNITY): Payer: Self-pay

## 2024-04-25 ENCOUNTER — Ambulatory Visit

## 2024-04-26 ENCOUNTER — Ambulatory Visit

## 2024-05-02 ENCOUNTER — Encounter: Admitting: Family Medicine
